# Patient Record
Sex: Female | Born: 1965 | Race: Black or African American | Hispanic: No
Health system: Midwestern US, Community
[De-identification: ages and names within clinical notes are randomized; demographics above are authoritative.]

## PROBLEM LIST (undated history)

## (undated) DIAGNOSIS — R87629 Unspecified abnormal cytological findings in specimens from vagina: Secondary | ICD-10-CM

## (undated) DIAGNOSIS — I10 Essential (primary) hypertension: Secondary | ICD-10-CM

## (undated) DIAGNOSIS — E785 Hyperlipidemia, unspecified: Secondary | ICD-10-CM

## (undated) DIAGNOSIS — Z5189 Encounter for other specified aftercare: Secondary | ICD-10-CM

## (undated) DIAGNOSIS — J449 Chronic obstructive pulmonary disease, unspecified: Secondary | ICD-10-CM

## (undated) DIAGNOSIS — J45909 Unspecified asthma, uncomplicated: Secondary | ICD-10-CM

## (undated) DIAGNOSIS — T7840XA Allergy, unspecified, initial encounter: Secondary | ICD-10-CM

## (undated) DIAGNOSIS — D869 Sarcoidosis, unspecified: Secondary | ICD-10-CM

## (undated) HISTORY — DX: Encounter for other specified aftercare: Z51.89

## (undated) HISTORY — DX: Essential (primary) hypertension: I10

## (undated) HISTORY — DX: Hyperlipidemia, unspecified: E78.5

## (undated) HISTORY — DX: Unspecified asthma, uncomplicated: J45.909

## (undated) HISTORY — DX: Chronic obstructive pulmonary disease, unspecified: J44.9

## (undated) HISTORY — PX: ABDOMINAL HYSTERECTOMY: SHX81

## (undated) HISTORY — DX: Sarcoidosis, unspecified: D86.9

## (undated) HISTORY — DX: Allergy, unspecified, initial encounter: T78.40XA

---

## 1999-09-05 NOTE — H&P (Signed)
[facility]                              HISTORY AND PHYSICAL   NAME:          Megan Carter, Megan Carter   MR #:          [id number]                     ADM DATE:         09/05/1999   BILLING #:     [id number]                    PT. LOCATION      OR  OR31   SS #           [id number]   Wynetta Fines, M.D.   cc:   Wynetta Fines, M.D.   HISTORY OF PRESENT ILLNESS:  The patient is a 34 year old gravida 3 para 3   female.  Her last menstrual period was August 23, 1999.   She is currently   not on contraception. The patient initially presented to me earlier this   year complaining of a history of uterine fibroids. At that time the patient   did not have significant complaints and she was counseled and advised that   we would manage her expectantly.   The patient came back later this year in May complaining of dyspareunia and   lower back pain. She was given a prescription for Anaprox and instructed to   follow-up with her primary care physician regarding other possible   etiologies of her lower back pain.  This evaluation by her primary care   physician was unremarkable.   He continued the patient on nonsteroidals;   namely, Celebrex. However, the patient stated that she continued to have   lower back pain and dyspareunia.   A pelvic ultrasound examination in my office revealed the patient to have   at least 4 uterine fibroids, the largest of which measuring 29 mm. in   diameter.  Her ovaries were unremarkable except for some simple cysts on   her left ovary.  The patient was counseled regarding these findings and   stated that secondary to her continued back pain and dyspareunia, she   desired definitive therapy for her uterine fibroids.   She is, therefore,   scheduled today for a vaginal hysterectomy.  However, she understands if   there is any difficulty with a vaginal hysterectomy that an abdominal   approach will be undertaken.   She also understands the risks of the    procedure includes infection, bleeding, damage to bowel and/or urinary   tract.   PAST MEDICAL HISTORY:  The patient had some sort of surgery on her bladder   at age 2. She is uncertain of the nature of this surgery.   She has had   three spontaneous vaginal deliveries. She received a blood transfusion   after one of her deliveries.   MEDICATIONS:   1.  Tylenol PM.   ALLERGIES: NO KNOWN DRUG ALLERGIES.   FAMILY HISTORY:  Remarkable for hypertension  in her mother.   SOCIAL HISTORY:  The patient smokes about ten cigarettes per day.  She   drinks occasional alcoholic beverages and denies illicit drug use.   REVIEW OF SYSTEMS:  Remarkable for lower back pain and dyspareunia.   PHYSICAL EXAMINATION:  GENERAL APPEARANCE:  Well-developed, well-nourished female who appears her   stated age, in no acute distress.   Temperature 97.4, blood pressure 110/60, pulse 84, respirations 16.   HEENT: Normocephalic.   NECK: Supple without masses.   CARDIOVASCULAR:  Regular rate and rhythm.   RESPIRATORY:  Lungs clear to auscultation.   ABDOMEN: Soft, non-tender.   PELVIC: Vulva and vagina no lesions or discharge.  Cervical os closed   without lesions.  Uterus approximately 8 weeks' size and non-tender with no   descensus.   Adnexa non-tender without masses.   EXTREMITIES:  Unremarkable.   IMPRESSION:   1.  Uterine fibroids, dyspareunia, lower back pain.   PLAN:  Vaginal hysterectomy, possibly abdominal hysterectomy.

## 1999-09-06 NOTE — Op Note (Signed)
[facility]                                OPERATION REPORT   NAME:         Megan Carter, Megan Carter   MR #:         [id number]                    DATE:           09/05/1999   BILLING #:    [id number]                   PT. LOCATION:   [id number]   SS #          [id number]   Wynetta Fines, M.D.   cc:   Wynetta Fines, M.D.   PREOPERATIVE DIAGNOSES:   Uterine fibroids, dyspareunia, lower back pain.   POSTOPERATIVE DIAGNOSES:   Same, endometriosis.   PROCEDURE:   Vaginal hysterectomy.   SURGEON:   Dr. Renita Papa.   ANESTHESIA:   General endotracheal.   ESTIMATED BLOOD LOSS:   100cc.   FINDINGS:   Approximately 8-weeks-sized, irregularly-shaped uterus with normal tubes   and ovaries.  Endometrial implant on rectosigmoid colon.   SPECIMENS:   Uterus.   DRAINS:   Foley catheter.   PACKING:   Iodoform vaginal packing.   COMPLICATIONS:   None.   DESCRIPTION OF PROCEDURE:  The patient was taken to operating room and   placed in a supine position.  After satisfactory induction of  general   endotracheal anesthesia, she was placed in the dorsal lithotomy position,   prepped and draped in the usual sterile fashion.  A weighted speculum was   placed in the posterior vagina, the right-angle retractor in the anterior   vagina and a Deaver retractor laterally.  The anterior cervix was grasped   with a straight double-tooth tenaculum and the posterior cervix was grasped   with a curved double-tooth tenaculum.  The junction between the vagina and   the cervix was injected with normal saline to help create a plane.  The   Bovie was then used to separate from the vagina from the cervix.  The Mayo   scissors were then used to enter the peritoneal cavity posteriorly.  A   curved Heaney clamp was placed on the uterosacral ligament on the right.   This was transected and suture ligated.  The same procedure was repeated on   the left.  The peritoneum was held in the midline with a figure-of-eight   stitch of  Vicryl suture to the vaginal cuff.  The bladder pillars were then   clamped, transected and suture ligated anteriorly bilaterally.  An   additional bite was taken of each cardinal ligament bilaterally.  These   were transected and suture ligated.  The peritoneum was then entered   anteriorly.  Care was taken prior to this time to make sure the bladder had   been pushed superiorly.  After the peritoneum was entered anteriorly, the   remaining cardinal ligaments and part of the uterine vessels were taken   bilaterally with a curved Heaney clamped, transected and suture ligated.   The remaining uterine vessels were taken bilaterally with the curved Heaney   clamp, transected and suture ligated.  Two clamps were placed across the   utero-ovarian ligament and the fallopian tube on the right.  These  were   transected.  A free tie was placed behind the first clamp and this tie was   cut.  A suture ligature was placed behind the second clamp.  The same   procedure was repeated on the contralateral side.  The specimen was then   removed.  The tubes and ovaries appeared normal bilaterally; however,   fortuitously a brown, approximately 4 centimeter area of endometriosis was   noted on the rectosigmoid colon with the classic powder-burn appearance.   The peritoneum anteriorly was then grasped with pickups and a pursestring   suture of 2-0 chromic was started anteriorly.  This was taken in a   clockwise fashion with bites being taken from the utero-ovarian pedicle,   the pedicle to the lower part of the cardinal ligament, the uterosacral   ligament.  A superficial bite was taken high on the rectosigmoid colon to   help prevent an enterocele.  The suture was continued clockwise   incorporating the same structures on the right.  Once this was done, the   pursestring was closed.  A couple of additional bites were taken in order   to provide adequate closure of the peritoneum.  Once this was achieved, the   suture was tied and cut.   The posterior vaginal cuff was run with a lock   stitch of Vicryl suture for hemostasis prior to placement of pursestring   suture.  The anterior vaginal cuff was closed following the pursestring   suture.  It was closed using interrupted figure-of-eight stitches of Vicryl   suture.  The sutures which had been placed on the uterosacral ligaments and   held were then used to provide additional support to the vaginal apex.   This was done by using a free needle to take one end of the suture through   the vagina mucosa.  These were then tied on each side.  Good hemostasis was   visualized.  A Foley was placed in the bladder with the return of a   moderate amount of clear yellow urine.  The vagina was then packed with   iodoform gauze.  The patient was returned to a supine position and   anesthesia was discontinued.  She was transferred to the recovery room in   stable condition.

## 1999-09-06 NOTE — Op Note (Signed)
[facility]                                OPERATION REPORT   NAME:         Megan Carter, Megan Carter   MR #:         [id number]                    DATE:           09/05/1999   BILLING #:    [id number]                   PT. LOCATION:   [id number]   SS #          [id number]   Wynetta Fines, M.D.   cc:   Wynetta Fines, M.D.   PREOPERATIVE DIAGNOSES:   Uterine fibroids, dyspareunia, lower back pain.   POSTOPERATIVE DIAGNOSES:   Same, endometriosis.   PROCEDURE:   Vaginal hysterectomy.   SURGEON:   Dr. Renita Papa.   ANESTHESIA:   General endotracheal.   ESTIMATED BLOOD LOSS:   100cc.   FINDINGS:   Approximately 8-weeks-sized, irregularly-shaped uterus with normal tubes   and ovaries.  Endometrial implant on rectosigmoid colon.   SPECIMENS:   Uterus.   DRAINS:   Foley catheter.   PACKING:   Iodoform vaginal packing.   COMPLICATIONS:   None.   DESCRIPTION OF PROCEDURE:  The patient was taken to operating room and   placed in a supine position.  After satisfactory induction of  general   endotracheal anesthesia, she was placed in the dorsal lithotomy position,   prepped and draped in the usual sterile fashion.  A weighted speculum was   placed in the posterior vagina, the right-angle retractor in the anterior   vagina and a Deaver retractor laterally.  The anterior cervix was grasped   with a straight double-tooth tenaculum and the posterior cervix was grasped   with a curved double-tooth tenaculum.  The junction between the vagina and   the cervix was injected with normal saline to help create a plane.  The   Bovie was then used to separate from the vagina from the cervix.  The Mayo   scissors were then used to enter the peritoneal cavity posteriorly.  A   curved Heaney clamp was placed on the uterosacral ligament on the right.   This was transected and suture ligated.  The same procedure was repeated on   the left.  The peritoneum was held in the midline with a figure-of-eight    stitch of Vicryl suture to the vaginal cuff.  The bladder pillars were then   clamped, transected and suture ligated anteriorly bilaterally.  An   additional bite was taken of each cardinal ligament bilaterally.  These   were transected and suture ligated.  The peritoneum was then entered   anteriorly.  Care was taken prior to this time to make sure the bladder had   been pushed superiorly.  After the peritoneum was entered anteriorly, the   remaining cardinal ligaments and part of the uterine vessels were taken   bilaterally with a curved Heaney clamped, transected and suture ligated.   The remaining uterine vessels were taken bilaterally with the curved Heaney   clamp, transected and suture ligated.  Two clamps were placed across the   utero-ovarian ligament and the fallopian tube on the right.  These  were   transected.  A free tie was placed behind the first clamp and this tie was   cut.  A suture ligature was placed behind the second clamp.  The same   procedure was repeated on the contralateral side.  The specimen was then   removed.  The tubes and ovaries appeared normal bilaterally; however,   fortuitously a brown, approximately 4 centimeter area of endometriosis was   noted on the rectosigmoid colon with the classic powder-burn appearance.   The peritoneum anteriorly was then grasped with pickups and a pursestring   suture of 2-0 chromic was started anteriorly.  This was taken in a   clockwise fashion with bites being taken from the utero-ovarian pedicle,   the pedicle to the lower part of the cardinal ligament, the uterosacral   ligament.  A superficial bite was taken high on the rectosigmoid colon to   help prevent an enterocele.  The suture was continued clockwise   incorporating the same structures on the right.  Once this was done, the   pursestring was closed.  A couple of additional bites were taken in order   to provide adequate closure of the peritoneum.  Once this was achieved, the    suture was tied and cut.  The posterior vaginal cuff was run with a lock   stitch of Vicryl suture for hemostasis prior to placement of pursestring   suture.  The anterior vaginal cuff was closed following the pursestring   suture.  It was closed using interrupted figure-of-eight stitches of Vicryl   suture.  The sutures which had been placed on the uterosacral ligaments and   held were then used to provide additional support to the vaginal apex.   This was done by using a free needle to take one end of the suture through   the vagina mucosa.  These were then tied on each side.  Good hemostasis was   visualized.  A Foley was placed in the bladder with the return of a   moderate amount of clear yellow urine.  The vagina was then packed with   iodoform gauze.  The patient was returned to a supine position and   anesthesia was discontinued.  She was transferred to the recovery room in   stable condition.

## 2001-05-11 NOTE — ED Provider Notes (Signed)
[facility]                      EMERGENCY DEPARTMENT TREATMENT REPORT   NAME:  Megan Carter, Megan Carter   MR #:         BILLING #: [id number]          DOS: 05/11/2001   TIME: 5:32 P   [id number]   cc:   Primary Physician:   The patient was seen at 1345.   CHIEF COMPLAINT:   Laceration to right 5th digit.   HISTORY OF PRESENT ILLNESS:  This is a 36 year old female who arrived to   the emergency department complaining that she was washing some dishes and   lacerated her right 5th digit.  She has bleeding under control and denies   any other injuries.   REVIEW OF SYSTEMS:   ENDOCRINE:  No diabetic symptoms.   PAST MEDICAL HISTORY:   Negative.   SOCIAL HISTORY:  Negative.   FAMILY HISTORY:  Negative.   ALLERGIES:  No known drug allergies.   MEDICATIONS:   None.   PHYSICAL EXAMINATION:   VITAL SIGNS:  Blood pressure 138/81, pulse 90, respirations 18, temperature   99.  On a pain scale of 0-10, the patient is a 4/10.   GENERAL APPEARANCE:  The patient appears well developed and well nourished.   Appearance and behavior are age and situation appropriate.   NECK:  Supple, nontender, symmetrical, no masses or JVD, trachea midline,   thyroid not enlarged, nodular, or tender.   RESPIRATORY:  Clear and equal breath sounds.  No respiratory distress,   tachypnea, or accessory muscle use.   CARDIOVASCULAR:  Heart regular, without murmurs, gallops, rubs, or thrills.   PMI not displaced.   MUSCULOSKELETAL:  Stance and gait appear normal.   SKIN:  Warm and dry without rashes.   NEUROLOGIC:  Cranial nerves, deep tendon reflexes, strength, and light   touch sensation are unremarkable.   RIGHT FIFTH DIGIT:  The patient has a 3-cm flap avulsion laceration.   Distal sensation and capillary refill is less than 3 seconds.   The patient   had good movement of the 5th digit.   COURSE IN THE EMERGENCY DEPARTMENT:  The patient had LAT and TD booster.    PROCEDURE:  The patient had four 4-0 Ethilon sutures applied.  Anesthesia   was obtained.  There was good approximation.  Copious amounts of normal   saline were utilized to clean the wound.  There was no foreign body noted.   Xeroform gauze was placed and tube gauze.   DISPOSITION:  The patient is discharged with verbal and written   instructions and a referral for ongoing care.  The patient is aware that   they may return at any time for new or worsening symptoms.   Condition is   stable, disposition is to home.   FINAL DIAGNOSIS:    A 3-centimeter laceration to right fifth digit.   DISCHARGE INSTRUCTIONS:  Keep the area clean and dry, return if there are   any signs of infection.  Return back in 7-10 days for suture removal.  The   patient was given off work until   March 31.   Electronically Signed By:   Wetzel Bjornstad Arvella Merles, M.D. 05/12/2001 18:23   ____________________________   Wetzel Bjornstad. Arvella Merles, M.D.   zga  D:  05/11/2001  T:  05/12/2001 11:59 A   [id number]   Hilaria Ota, PA-C

## 2008-11-18 NOTE — ED Provider Notes (Signed)
[facility]                      EMERGENCY DEPARTMENT TREATMENT REPORT   PRELIMINARY (DRAFT) -- FINAL REPORT  in HPF   NAME:  Megan Carter, Megan Carter              SEX:            F   DATE:  11/18/2008                     DOB:            [birth year]   MR#    [id number]                       TIME SEEN       10:23 A   ACCT#  [id number]                      ROOM:           ER  ZO10   cc:   CHIEF COMPLAINT   Cough, wheezing.   HISTORY OF PRESENT ILLNESS   This is a 43 year old female started with a sore throat, kind of a   "burning" in her throat about a week ago.  That has gotten better, but she   has started to cough, feels that she has been wheezing.  States that she   feels like there is phlegm in her chest, but she is unable to expirate it.   She feels nauseated when she coughs a lot.  She has had no diarrhea.   Denied any leg pain or swelling, no fevers.  She has not traveled anywhere.   REVIEW OF SYSTEMS   CONSTITUTIONAL:  No fever, chills, weight loss.   ENT: Had burning in her throat that has gotten better.   RESPIRATORY:  Positive for cough, wheezing, shortness of breath.   CARDIOVASCULAR:  No chest pain, chest pressure, or palpitations.   GASTROINTESTINAL:  No vomiting, diarrhea, or abdominal pain.   MUSCULOSKELETAL:  Denies any leg pain or swelling.   INTEGUMENTARY:  No rashes.   NEUROLOGICAL:  No headaches, sensory or motor symptoms.   PAST MEDICAL HISTORY   Asthma as a child.  She has had total abdominal hysterectomy.   MEDICATIONS   Nothing on a regular basis.   ALLERGIES   None.   SOCIAL HISTORY   Positive for tobacco, smoking cessation discussed.  Negative for travel.   FAMILY HISTORY   Noncontributory.   PHYSICAL EXAMINATION   GENERAL APPEARANCE:  This is a well-developed female.   VITAL SIGNS:  Blood pressure 150/69, pulse 81, respirations 16, temperature   97.9, O2 saturation 100% on room air.   HEENT:  Eyes:  Conjunctivae are clear.  Ears:  No __ bilaterally.  Mouth:    Mucous membranes pink.  Throat is clear.   NECK:  Supple, nontender, symmetrical, no masses or JVD, trachea midline.   Thyroid not enlarged, nodular, or tender.   LUNGS:  Scattered wheezing heard throughout with some rhonchi.  She is not   tachypneic or dyspneic with conversation.  No accessory muscle use.   HEART:  Regular rate and rhythm.   ABDOMEN:  Soft and nontender.   EXTREMITIES:  Warm and dry.  Calves soft and nontender.   SKIN:  Without rash.   NEUROLOGICAL:  She is awake, alert, and oriented.   CONTINUATION  BY JULIA HUBBARD, PA-C   IMPRESSION/MANAGEMENT PLAN   This 43 year old female presents for evaluation of wheezing, cough. At this   time a chest x-ray will be obtained to make sure there is no acute   pulmonary process.  We will give her neb treatment, prednisone p.o. and   some p.o. fluids, and we will reassess.   DIAGNOSTIC STUDIES   Chest x-ray was read by radiology as normal study per Dr. Noel Gerold.   COURSE IN THE EMERGENCY DEPARTMENT   Findings were discussed.  She was breathing better after the nebulizer   treatment and albuterol inhaler was ordered.  At this time discussed we   would continue her on prednisone at home, rest, push fluids, prescription   for some Robitussin AC to help with her cough.  We we will also start her   on some doxycycline.  She understands to certainly return any time   worsening or new concerns.  Discussed smoking cessation.   FINAL DIAGNOSES   Evaluation acute bronchitis, bronchospasm.   DISPOSITION AND PLAN   The patient was discharged home in stable condition to follow up as above.   The patient was examined and evaluated by myself and Dr. Angelena Sole who   agrees with the above assessment and plan.   ____________________________   Imogene Burn, M.D.   Dictated By:  Fara Chute, PA-C   My signature above authenticates this document and my orders, the final   diagnosis(es), discharge prescription(s) and instructions in the [facility] record.    ec  D:  11/18/2008  T:  11/18/2008  4:49 P   [id number]

## 2012-01-14 LAB — CBC WITH AUTOMATED DIFF
BASOPHILS: 2 % (ref 0–3)
EOSINOPHILS: 5 % (ref 0–5)
HCT: 43.8 % (ref 37.0–50.0)
HGB: 13.9 gm/dl (ref 13.0–17.2)
LYMPHOCYTES: 37 % (ref 28–48)
MCH: 23.6 pg — ABNORMAL LOW (ref 25.4–34.6)
MCHC: 31.6 gm/dl (ref 30.0–36.0)
MCV: 74.5 fL — ABNORMAL LOW (ref 80.0–98.0)
MONOCYTES: 10 % (ref 1–13)
MPV: 8.1 fL (ref 6.0–10.0)
NEUTROPHILS: 47 % (ref 34–64)
NRBC: 0 (ref 0–0)
PLATELET COMMENTS: NORMAL
PLATELET: 309 10*3/uL (ref 140–450)
RBC: 5.87 M/uL — ABNORMAL HIGH (ref 3.60–5.20)
RDW: 15.4 % — ABNORMAL HIGH (ref 11.5–14.0)
WBC: 4.4 10*3/uL (ref 4.0–11.0)

## 2012-01-14 LAB — CKMB PROFILE
CK - MB: 1.3 ng/ml (ref 0.0–3.6)
CK - MB: 1.1 ng/ml (ref 0.0–3.6)
CK - MB: 1.5 ng/ml (ref 0.0–3.6)
CK-MB Index: 0.6 % (ref 0.0–4.9)
CK-MB Index: 0.6 % (ref 0.0–4.9)
CK-MB Index: 0.9 % (ref 0.0–4.9)
CK: 220 U/L — ABNORMAL HIGH (ref 26–192)
CK: 187 U/L (ref 26–192)
CK: 165 U/L (ref 26–192)

## 2012-01-14 LAB — METABOLIC PANEL, BASIC
BUN: 19 mg/dl (ref 7–25)
CO2: 29 mEq/L (ref 21–32)
Calcium: 9.4 mg/dl (ref 8.5–10.1)
Chloride: 103 mEq/L (ref 98–107)
Creatinine: 0.9 mg/dl (ref 0.6–1.3)
GFR est AA: >60
GFR est non-AA: >60
Glucose: 80 mg/dl (ref 74–106)
Sodium: 138 mEq/L (ref 136–145)

## 2012-01-14 LAB — TROPONIN I
Troponin-I: <0.015 ng/ml (ref 0.00–0.09)
Troponin-I: <0.015 ng/ml (ref 0.00–0.09)
Troponin-I: <0.015 ng/ml (ref 0.00–0.09)

## 2012-01-14 NOTE — ED Provider Notes (Signed)
[facility]  EMERGENCY DEPARTMENT TREATMENT REPORT  NAME:  Megan Carter  SEX:   F  ADMIT: 01/14/2012  DOB:   1966/02/04  MR#    [id number]  ROOM:  EO03  TIME SEEN: 11 29 AM  ACCT#  [id number]        CHIEF COMPLAINT:  Chest pain.    HISTORY OF PRESENT ILLNESS:  The patient is a 46 year old female here today for evaluation of intermittent   chest pain.  She describes it as a squeezing sensation intermittently over the   last 2 to 3 weeks.  Not so much exertional; can happen at rest.  Has no   associated nausea or vomiting with this.  Does have some very intermittent   mild shortness of breath when the chest pain comes on.  Denies any hemoptysis,   has no lower extremity edema or swelling.  Has no history of DVT or pulmonary   emboli.  Says she does endorse some dizziness which she describes actually as   a lightheadedness; feels like she may pass out, but has actually not passed   out.  She said these  usually come on after chest pain-like episodes but not   always.    She denies any headache.  Has no neurologic symptoms, otherwise has no   complaints.    REVIEW OF SYSTEMS:  CONSTITUTIONAL:  No fever, chills, or weight loss.   EYES:   No visual symptoms.   ENT:  No sore throat, runny nose, or other URI symptoms.   HEMATOLOGIC/LYMPHATIC:   No excessive bruising or lymph node swelling.   RESPIRATORY:  Positive for intermittent shortness of breath.  Negative for   cough or hemoptysis.  CARDIOVASCULAR:  Positive for chest pain.  Negative for pressure or   palpitations.  GASTROINTESTINAL:  No vomiting, diarrhea, or abdominal pain.   GENITOURINARY:  No dysuria, frequency, or urgency.   NEUROLOGICAL:  No headaches, sensory or motor symptoms.   Denies complaints in all other systems.     PAST MEDICAL HISTORY:  Asthma.    PAST SURGICAL HISTORY:  Hysterectomy.    PSYCHIATRIC HISTORY:  Negative.    SOCIAL HISTORY:  The patient smokes cigarettes, 5 to 6 cigarettes per day.  Denies illicit drug    abuse or alcohol abuse.    FAMILY HISTORY:  Includes coronary artery disease, hypertension, and diabetes.    ALLERGIES:  NO KNOWN DRUG ALLERGIES.    CURRENT MEDICATIONS:  The patient takes no medications.    PHYSICAL EXAMINATION:  GENERAL:  The patient is a well-appearing 46 year old female.  VITAL SIGNS:  On arrival, blood pressure 139/79, pulse 80, respirations are   18, temperature is 98.2, oxygen saturation is 99% on room air.  HEENT:  Head is normocephalic.  NECK:  Supple, full range of motion.  There is no JVD appreciated.  HEART:  Regular rate and rhythm, no murmurs, rubs or gallops.  LUNGS:  Clear to auscultation bilaterally.  ABDOMEN:  Soft and nondistended.  SKIN:  Negative for diaphoresis or rash.    INITIAL ASSESSMENT AND MANAGEMENT PLAN:  This is a healthy appearing 46 year old female here today with intermittent   chest discomfort, but known family history of coronary disease for evaluation   at this time.  She appears very well.  Plan at this point in time is to do a   chest x-ray and EKG.  We will send cardiac markers.  She has stable vital   signs, has no risk factors  for DVT or PE and by Wells criteria is low risk,   and by PE Rule Out criteria is negative, so I do not think any workup for   pulmonary embolus is necessary, although given the family history, we should   entertain possible coronary artery disease.      DIAGNOSTIC STUDIES:   Chest x-ray shows no acute abnormality.  The EKG shows a normal sinus rhythm   with a rate of 77, normal axis, but no ST elevations.  Impression is no acute   ischemia.      LABORATORY DATA:   Results: CBC: White count is 4.4, hemoglobin 13.9, platelets are 309.    Metabolic panel is unremarkable.  Cardiac marker is within normal limits.      COURSE IN EMERGENCY DEPARTMENT:   While the patient was here, she remained stable.  Given her results; they are   very reassuring, we have not ruled out acute coronary syndrome or angina-like    equivalents.  After discussion with the patient, the plan is to watch her   overnight.  We will repeat cardiac enzymes and do the chest pain protocol for   a stress test in the morning.      DISPOSITION:  Chest pain.    ADMITTING DIAGNOSES:    Chest pain.      ___________________  Wynelle Bourgeois MD  Dictated By: Marland Kitchen     My signature above authenticates this document and my orders, the final   diagnosis (es), discharge prescription (s), and instructions in the [facility]   Pul[facility]heck record.  DE  D:01/14/2012  T: 01/14/2012 12:23:49  [id number]  Authenticated by Wynelle Bourgeois, MD On 01/15/2012 10:44:31 AM

## 2012-01-15 NOTE — Discharge Summary (Signed)
[facility]  ED Discharge Summary  NAME:  Megan Carter, Megan Carter  SEX:   F  DOB: [birth year]  MR#    [id number]  ROOM:  EO03  ACCT#  [id number]        DATE OF DISCHARGE:   01/15/2012 at 11:15    DATE AND TIME OF ASSIGNMENT:  01/14/2012 at 11:05.    ASSIGNMENT DIAGNOSIS:  Acute precordial pain.    HISTORY:  This is a 46 year old female who presented to the Emergency Department for   evaluation of chest pain, was evaluated for possibility of acute ischemic   coronary disease.  The patient was seen in the Emergency Department for   evaluation of chest pain.  The evaluation was unremarkable and subsequently   the patient was assigned to observation under chest pain protocol.      COURSE IN THE EMERGENCY DEPARTMENT:  Course in Observation:  The patient remained pain free and did not develop   other symptoms.  The cardiac enzymes were negative and the patient underwent   an EST, the results of which were ____.  Stress echo was read by cardiology as   an adequate study with target heart rate achieved, it was negative for   ischemia.  Findings were discussed, discussed other possible etiologies.  It   was discussed with the patient that their cardiac evaluation was unremarkable   and does not indicate that immediate intervention is necessary. The patient   was counseled that the testing is not 100 percent accurate and may generate   false negatives.  She remained asymptomatic with the stress test.  Did not   have any chest pain, has been comfortable here.  She is currently eating her   breakfast.  At this time discussed followup.  We will give her the name of Dr.   Bethanne Ginger on call.  Advised to call this afternoon to schedule a followup   appointment.  To certainly seek medical attention any time worsening or new   concerns.  She was given the opportunity to ask questions and she does feel   comfortable with this plan.    PHYSICAL EXAMINATION:  GENERAL:  A well-developed female.   VITAL SIGNS:  Blood pressure 115/60, pulse 73, respirations 18, temperature   97.8, O2 sats 100% on room air.  HEENT:  Conjunctivae are clear.  Mucous membranes pink.  NECK:  Supple, nontender, symmetrical, no masses or JVD, trachea midline,   thyroid not enlarged, nodular, or tender.  LUNGS:  Clear to auscultation with symmetrical expansion.  HEART:  Has a regular rate and rhythm.  ABDOMEN:  Soft, nontender.  EXTREMITIES:  Warm and dry.    FINAL DIAGNOSIS:  Acute precordial pain, cardiac unlikely.    DISPOSITION AND PLAN:  The patient was discharged home in stable condition to follow up as above.    The patient was examined and evaluated by myself and Dr. Candis Shine who   agrees with the above assessment and plan.      ___________________  Wynelle Bourgeois MD  Dictated UE:AVWUJ C. Hubbard, [place]    My signature above authenticates this document and my orders, the final   diagnosis(es), discharge prescription(s) and instructions in the [facility]   [facility]ulse[facility] record.  [facility]  D:01/15/2012  T: 01/15/2012 18:01:04  [id number]  Authenticated by Wynelle Bourgeois, MD On 01/17/2012 07:48:00 AM

## 2012-03-26 NOTE — ED Provider Notes (Signed)
[facility]  EMERGENCY DEPARTMENT TREATMENT REPORT  NAME:  Megan Carter  SEX:   F  ADMIT: 03/26/2012  DOB:   [birth year]  MR#    [id number]  ROOM:    TIME SEEN: 03 21 PM  ACCT#  [id number]        TIME OF EVALUATION:  0955    CHIEF COMPLAINT:  Toothache and facial swelling.    HISTORY OF PRESENT ILLNESS:  A 47 year old female with multiple bad teeth presents complaining of pain and   swelling to the gingiva adjacent to first premolar.  The first premolar has   been missing for some time as have several other teeth along the right   jawline.  She has never followed up with a dentist as she does not have any   insurance.  Her symptoms just began last night.  Pain rated 8 out of 10,   constant.  No fever.    REVIEW OF SYSTEMS:  CONSTITUTIONAL:  As above.  ENT:  As above.  SKIN:  Facial swelling.    PAST MEDICAL HISTORY:  Hysterectomy.    SOCIAL HISTORY:  Tobacco use.  Denies alcohol and recreational drug use.    FAMILY HISTORY:  Noncontributory.    ALLERGIES:  NONE.    MEDICATIONS:  None.    PHYSICAL EXAMINATION:  VITAL SIGNS:  BP 121/71, pulse 78, respirations 16, temperature 98, pain 8, O2   saturation 100% on room air.  GENERAL APPEARANCE:  The patient appears well developed, well nourished.  HEENT:  Eyes:  Conjunctivae clear, lids normal.  Pupils equal, symmetrical,   and normally reactive.  Ears:  Canals and TMs clear bilaterally.  Mouth and   throat:  Oral mucosa is pink and moist.  No trismus or sublingual elevation.    Floor of mouth is soft.  Teeth:  Poor dentition throughout.  Multiple teeth   are missing.  Peri-teeth are present, appear to have cavities or fracture   along the right lower gumline.  The central incisor through cuspid are fully   intact.  The second premolar is partially fractured.  Rest of teeth are   missing.  She is tender there with gingival swelling adjacent to where the   first premolar would lie.  Area is very tender and fluctuant.  With pressure,    pustular drainage is expressed.  No other areas of fluctuance or abscess are   noted.  Posterior pharynx uvula midline.  No erythema to posterior pharynx.    No tonsillar swelling, no exudate.  Airway is  patent, no stridor.  NECK:  Soft, nontender, no masses, no submandibular swelling.   SKIN:  Minimal facial swelling noted along the right mandible adjacent to   where first premolar would lie.    CONTINUATION BY COLLEEN O'LEARY, PA:     ASSESSMENT AND MANAGEMENT PLAN:  The patient with pain to dentition and face.  Does have a dental abscess   present that does appear to already be draining some purulent discharge.  I   will perform an I&D to this site.  Discharge patient with antibiotics, pain   medicine and dental follow up. She is agreeable with this plan.    PROCEDURE:  Performed by myself, using a continuous suction throughout, pressure was   applied to her dental abscess which already is spontaneously draining.  A   small amount of pus is extracted.  As I do believe additional pus is present,   I did perform an I&D  with an 11 blade.  Additional pus was evacuated.    Afterwards direct pressure with gauze was applied to the incision site for 5   minutes.  When rechecked, bleeding had stopped.  The site was clean.  The   patient tolerated the procedure well.    FINAL DIAGNOSES:  1.  Dental abscess.  2.  Dental pain.    DISPOSITION AND PLAN:  The patient discharged home in stable condition with verbal instructions for   ongoing care.  She is to follow up with dentist for further treatment.    Pen-Vee K prescription given.  Should take ibuprofen 600 mg 3 times daily for   mild pain.  Vicodin prescription given as needed for severe pain.  She was   warned not to drive or operate machinery while medicated on Vicodin.  She is   aware she may return at any time for new or worsening symptoms.  The patient   was personally evaluated by myself and Dr. Hervey Ard, who agrees with the   above assessment and plan.       ___________________  Tana Conch MD  Dictated By: Verlin Grills, PA    My signature above authenticates this document and my orders, the final   diagnosis (es), discharge prescription (s), and instructions in the [facility]   Pul[facility]heck record.  zga  D:03/26/2012  T: 03/26/2012 15:45:11  [id number]  Authenticated by Tana Conch, MD On 04/09/2012 57:84:69 PM

## 2013-11-03 NOTE — ED Provider Notes (Addendum)
[facility]  EMERGENCY DEPARTMENT TREATMENT REPORT  NAME:  Megan Carter  SEX:   F  ADMIT: 11/01/2013  DOB:   [birth year]  MR#    [id number]  ROOM:    TIME DICTATED: 10 29 PM  ACCT#  [id number]        CHIEF COMPLAINT:  Cough.    HISTORY OF PRESENT ILLNESS:  The patient is a 48 year old female smoker with a history of bronchitis.  She  states that she has used  inhalers in the past and she had a cold. She is  complaining of  nasal congestion. She is complaining of  2 to 3 days of  wheezing, chest tightness and cough.  She complains of laryngitis.  She denies  any fevers, chills.  The cough is nonproductive.  She denies any chest pain.    REVIEW OF SYSTEMS:  CONSTITUTIONAL:  No fever, chills, malaise.  HEENT:  See HPI.  RESPIRATORY:  See HPI.  CARDIOVASCULAR:  No chest pain, palpitations or edema.  GASTROINTESTINAL:  No vomiting, diarrhea, or abdominal pain.  MUSCULOSKELETAL:  No joint pain, swelling.  INTEGUMENTARY:  No rashes, abrasions, ecchymosis.  Denies complaints in all other systems.    PAST MEDICAL HISTORY:  Includes asthma.    PAST SURGICAL HISTORY:  None.    SOCIAL HISTORY:  She smokes tobacco, drinks socially.    ALLERGIES:  NO KNOWN DRUG ALLERGIES.    MEDICATIONS:  Not currently taking medications.    PHYSICAL EXAMINATION:  VITAL SIGNS:  Blood pressure is 140/90, pulse 66, respirations 18, temperature  98, pain 6 out of 10, O2 sats are 98% on room air.  GENERAL:  The patient is a healthy appearing 48 year old female who I saw  after her initial neb.  Eyes:  Conjunctivae clear, lids normal.  Pupils equal, symmetrical, and  normally reactive.  ENT: Mouth/Throat:  Surfaces of the pharynx, palate, and tongue are pink,  moist, and without lesions. Note that she has bilateral swollen, boggy  turbinates with clear rhinorrhea.  No sinus tenderness.  She has bilateral  bulging tympanic membranes without erythema or purulence.  HEART:  Regular rate and rhythm.   RESPIRATORY:  She has scattered wheezing in all lung fields on expiratory, and  they are fine wheezes.  CHEST:  Chest symmetrical without masses or tenderness.  SKIN: Clean, dry and intact.     EMERGENCY DEPARTMENT COURSE:  The patient was seen and examined.  She was given nebs, metered dose inhaler,  a dose of steroids.  She was given a course of steroids for home.  She was  given Afrin for nasal congestion.  She was given instructions for care and  followup.    DIAGNOSES:  1.  Bronchospasm.  2.  Upper respiratory infection.    DISPOSITION:  Discharged to home in stable condition.    The patient was personally evaluated by myself and Dr. Arvella Merles who agrees with  the above assessment and plan.      ___________________  Smitty Cords MD  Dictated By: Crecencio Mc, PA-C    My signature above authenticates this document and my orders, the final  diagnosis (es), discharge prescription (s), and instructions in the [facility]  Pul[facility]ck record.  Nursing notes have been reviewed by the physician/mid-level provider.    If you have any questions please contact [phone].    PB  D:11/02/2013 22:29:43  T: 11/03/2013 01:09:05  [id number]  Electronically Authenticated by:  Smitty Cords, M.D. On 11/03/2013 09:46 AM  EDT

## 2014-06-30 ENCOUNTER — Inpatient Hospital Stay
Admit: 2014-06-30 | Discharge: 2014-06-30 | Disposition: A | Discharge status: Home / Self Care | Payer: BLUE CROSS/BLUE SHIELD | Attending: Emergency Medicine

## 2014-06-30 DIAGNOSIS — K029 Dental caries, unspecified: Secondary | ICD-10-CM

## 2014-06-30 MED ORDER — PENICILLIN V-K 500 MG TAB
500 mg | ORAL_TABLET | Freq: Two times a day (BID) | ORAL | Status: AC
Start: 2014-06-30 — End: 2014-07-07

## 2014-06-30 MED ORDER — NAPROXEN 500 MG TAB
500 mg | ORAL_TABLET | ORAL | Status: DC
Start: 2014-06-30 — End: 2014-07-29

## 2014-06-30 NOTE — Progress Notes (Signed)
Life Coach had the pleasure of meeting the patient. Patient requires follow up for dental and Life Coach also became aware that the patient does not have a pcp established.  Life Coach provided the patient with an abundance of dental resources and offered to schedule the patient with a pcp.  Life Coach was in agreement.  Life Coach scheduled the patient as follows:  [facility]  [address], [address]  [place], [place] [id number]  [phone]  07/08/14 @ 9:30 am with a check in of 9:00 a.m.   Life Coach provided the patient with the information verbally and usps.  Life Coach will continue to follow up with the patient.

## 2014-06-30 NOTE — ED Provider Notes (Signed)
[facility]  EMERGENCY [facility] TREATMENT REPORT  NAME:  Megan ScottLAUZON, Megan  SEX:   F  ADMIT: 06/30/2014  DOB:   1965/06/08  MR#    [id number]  ROOM:  [id number]  TIME DICTATED: 04 42 PM  ACCT#  [id number]        TIME OF EVALUATION:   1011.     CHIEF COMPLAINT:  Tooth pain.    HISTORY OF PRESENT ILLNESS:  This is a 49 year old female who presents with a complaint of pain in her left   lower molar.  This has been going on for the last few days.  Woke up, was   kind of swollen, painful, it hurts to swallow.  She has not noticed any   drainage.  No fever, no chills, no other complaints.  She does have bad teeth.    REVIEW OF SYSTEMS:  CONSTITUTIONAL:  No fever, chills, or weight loss.   EYES:   No visual symptoms.  ENT:  Positive for pain in the left side of her face which she believes is   coming from a left lower molar tooth that is painful and feels swollen, with   pain when she swallows.  RESPIRATORY:  No cough, shortness of breath, or wheezing.  CARDIOVASCULAR:  No chest pain, chest pressure, or palpitations.  GASTROINTESTINAL:  No vomiting, diarrhea, or abdominal pain.  MUSCULOSKELETAL:  No leg pain or swelling.  NEUROLOGIC:  Positive for left-sided headache and facial pain.  Denies complaints in all other systems.    PAST MEDICAL HISTORY:   None.     MEDICATIONS:  None.    ALLERGIES:  NONE.    SOCIAL HISTORY:  Positive for tobacco dependence, smoking cessation encouraged.    PHYSICAL EXAMINATION:  GENERAL:  A well-developed female.  VITAL SIGNS:  Blood pressure 124/83, pulse 84, respirations 18, temperature   98.9.  HEENT:  Head and face:  No obvious facial swelling.  Eyes:  Conjunctivae   clear, lids normal.  Pupils equal, symmetrical, and normally reactive.  Ears:    TMs are clear bilaterally with some cerumen in the right canal.  Mouth:    Mucous membranes pink.  Teeth in poor repair with numerous fractured carious   teeth.  There are multiple teeth missing.  Her left lower molar is tender.  I    really cannot appreciate any fluctuance around this tooth.  No crepitus.    There are no masses or fullness in the floor of the mouth.  The throat is   clear.  NECK:  Supple, nontender.  LUNGS:  Clear to auscultation.  HEART:  Regular rate and rhythm.  ABDOMEN:  Soft, nontender.  EXTREMITIES:  Warm and dry, well perfused.    IMPRESSION AND MANAGEMENT PLAN:  This is a 49 year old female who presents for evaluation of dental pain   related to dental caries.  I query whether there may be a developing abscess   here.  We will cover with some Pen-Vee K.  I will write for some Naprosyn.  I   will have her use Tylenol.  I put a life coach consult in and they are seeing   her now to see if they can help her with followup with one of the clinics in   the area.  She understands to certainly seek medical attention worsening or   new concerns.  I have also written a note for work as well.    FINAL DIAGNOSIS:  Acute dental pain, dental caries, left  lower molar.    DISPOSITION AND PLAN:  The patient was discharged home in stable condition to follow up as above.    The patient was examined by myself and Dr. Hildred Priestim Brenley Priore who agrees with the   above assessment and plan.      ___________________  Gwenyth Allegraimothy S Evaline Waltman MD  Dictated By: Maurice SmallJulia C. Williams CheHubbard, [place]    My signature above authenticates this document and my orders, the final   diagnosis (es), discharge prescription (s), and instructions in the Epic   record.  If you have any questions please contact [phone].    Nursing notes have been reviewed by the physician/ advanced practice   clinician.    JMB  D:06/30/2014 16:42:48  T: 06/30/2014 17:40:44  [id number]

## 2014-06-30 NOTE — Other (Signed)
12:24 PM  06/30/2014     Discharge instructions given to patient (name) with verbalization of understanding. Patient accompanied by self.  Patient discharged with the following prescriptions naprosyn, penicillin. Patient discharged to home (destination).      Trecia RogersMICHELE LANTRY, RN

## 2014-07-08 ENCOUNTER — Ambulatory Visit
Admit: 2014-07-08 | Discharge: 2014-07-08 | Payer: PRIVATE HEALTH INSURANCE | Attending: Physician Assistant | Primary: Physician Assistant

## 2014-07-08 DIAGNOSIS — F17210 Nicotine dependence, cigarettes, uncomplicated: Secondary | ICD-10-CM

## 2014-07-08 DIAGNOSIS — J4520 Mild intermittent asthma, uncomplicated: Secondary | ICD-10-CM | POA: Insufficient documentation

## 2014-07-08 MED ORDER — ALBUTEROL SULFATE HFA 90 MCG/ACTUATION AEROSOL INHALER
90 mcg/actuation | RESPIRATORY_TRACT | Status: AC | PRN
Start: 2014-07-08 — End: ?

## 2014-07-08 MED ORDER — VARENICLINE 0.5 MG (11)-1 MG (3X14) TABS IN A DOSE PACK
0.5 mg (11)- 1 mg (42) | ORAL | Status: AC
Start: 2014-07-08 — End: ?

## 2014-07-08 NOTE — Progress Notes (Signed)
HISTORY OF PRESENT ILLNESS  Megan Carter is a 49 y.o. female.  HPI  Megan Carter is a 49 y.o. female who presents to the office today for asthma.  She is new to the practice. She has a hx of asthma. This was dx as a child. She has an albuterol inhaler but rarely has to use this. She blames her occasional SOB or wheezing due to tobacco use. She is a heavy smoker. She smokes up to 2 ppd since the age of 49yo. She has tried quitting in the past, usually cold [place], but has been unsuccessful. She is interested in quitting and is ready.  She has a past history of alcohol use, and stopped drinking alcohol 5 years ago. 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     She has not been to a physician in many years. She cannot recall her last physical exam, pap or mammogram.   She has 28lb unintentional weight loss, but this started after she started working for [employer] and had 12+ hour shifts. She did not have time to eat much. She has been drinking Boost to add more calories daily and this seems to be helping some.       Chief Complaint   Patient presents with   ??? Establish Care   ??? Asthma       Current Outpatient Prescriptions on File Prior to Visit   Medication Sig Dispense Refill   ??? [EXPIRED] penicillin v potassium (VEETID) 500 mg tablet Take 2 Tabs by mouth two (2) times a day for 7 days. 28 Tab 0   ??? naproxen (NAPROSYN) 500 mg tablet 1 tab po bid po prn pain 20 Tab 0     No current facility-administered medications on file prior to visit.     No Known Allergies  Past Medical History   Diagnosis Date   ??? Asthma      History   Smoking status   ??? Current Every Day Smoker -- 2.00 packs/day   ??? Start date: 04/09/1977   Smokeless tobacco   ??? Never Used     History   Alcohol Use No     Family History   Problem Relation Age of Onset   ??? Heart Failure Mother        Review of Systems   Constitutional: Positive for weight loss. Negative for fever, malaise/fatigue and diaphoresis.    Respiratory: Negative for cough, shortness of breath and wheezing.    Cardiovascular: Negative for chest pain, palpitations and leg swelling.   Gastrointestinal: Negative for nausea, vomiting, abdominal pain, diarrhea and constipation.   Musculoskeletal: Negative for myalgias and falls.   Skin: Negative for rash.   Neurological: Negative for dizziness and headaches.   Endo/Heme/Allergies: Negative for environmental allergies.   Psychiatric/Behavioral: Negative for depression. The patient is not nervous/anxious.        BP 124/71 mmHg   Pulse 64   Temp(Src) 98.2 ??F (36.8 ??C) (Oral)   Resp 20   Ht 5\' 5"  (1.651 m)   Wt 140 lb (63.504 kg)   BMI 23.30 kg/m2   SpO2 100%    Physical Exam   Constitutional: She is oriented to person, place, and time. She appears well-developed and well-nourished. No distress.   Neck: Normal range of motion. Neck supple. No thyromegaly present.   Cardiovascular: Normal rate, regular rhythm and normal heart sounds.    No murmur heard.  Pulmonary/Chest:  Effort normal and breath sounds normal. No respiratory distress. She has no wheezes.   Abdominal: Soft. She exhibits no distension. There is no tenderness.   Musculoskeletal: She exhibits no edema.   Lymphadenopathy:     She has no cervical adenopathy.   Neurological: She is alert and oriented to person, place, and time.   Skin: Skin is warm and dry.   Psychiatric: She has a normal mood and affect. Her behavior is normal. Thought content normal.   Nursing note and vitals reviewed.      ASSESSMENT and PLAN    ICD-10-CM ICD-9-CM    1. Cigarette nicotine dependence without complication F17.210 305.1 varenicline (CHANTIX STARTER PAK) 0.5 mg (11)- 1 mg (42) DsPk      PR SMOKING AND TOBACCO USE CESSATION 3 - 10 MINUTES   2. Mild intermittent asthma without complication J45.20 493.90 albuterol (PROVENTIL HFA, VENTOLIN HFA, PROAIR HFA) 90 mcg/actuation inhaler   3. H/O drug abuse Z87.898 305.93    4. H/O alcohol dependence (HCC) F10.21 303.93        She was counseled on smoking cessation for 5 minutes. She has been unsuccessful in the past with cold [place]. She will try Chantix. I printed off the savings card in the office for her to bring to the pharmacy. She is ready to quit smoking. She will follow up in 3 weeks  I refilled her albuterol inhaler. Asthma is stable and under control.  Congratulations on your success with continued abstinence from alcohol and drug use.   She will schedule an appointment for her well woman exam. At that time she will have a Pap, order labs and mammogram.   Reviewed medication and side effects. Patient agrees with the plan and verbalizes understanding.     Follow-up Disposition:  Return in about 3 weeks (around 07/29/2014) for well woman exam.    Lennox Grumbles, PA-C  07/08/2014

## 2014-07-08 NOTE — Progress Notes (Signed)
Megan Carter is a 49 y.o. female in today to establish care. Patient is interested in CPE.    Learning assessment completed; primary language is [place].

## 2014-07-08 NOTE — Patient Instructions (Signed)
Varenicline (By mouth)   Varenicline (var-EN-i-kleen)  Helps you quit smoking, as part of a support program.   Brand Name(s):Chantix, Chantix Starting Month Pak   There may be other brand names for this medicine.  When This Medicine Should Not Be Used:   This medicine is not right for everyone. Do not use it if you had an allergic reaction to varenicline.  How to Use This Medicine:   Tablet  ?? Take your medicine as directed. Your dose or schedule may need to be changed to find what works best for you.  ?? Tell your doctor what date you have set to stop smoking. This medicine needs to be started 1 week before that date.  ?? It is best to take this medicine with a full glass of water after you eat.  ?? This medicine should come with a Medication Guide. Ask your pharmacist for a copy if you do not have one.  ?? Missed dose: Take a dose as soon as you remember. If it is almost time for your next dose, wait until then and take a regular dose. Do not take extra medicine to make up for a missed dose.  ?? Store the medicine in a closed container at room temperature, away from heat, moisture, and direct light.  Drugs and Foods to Avoid:   Ask your doctor or pharmacist before using any other medicine, including over-the-counter medicines, vitamins, and herbal products.  ?? Some medicines can affect how varenicline works. Tell your doctor if you are using any of the following:  ?? Insulin  ?? Theophylline  ?? A blood thinner (such as warfarin)  ?? This medicine can affect your ability to tolerate alcohol. Limit the amount of alcohol that you drink until you know how this medicine affects you.  Warnings While Using This Medicine:   ?? Tell your doctor if you are pregnant or breastfeeding, or if you have kidney disease, heart or blood vessel problems, angina, or a history of heart attack, stroke, depression or mental health problems, or seizures.  ?? For some people, this medicine may increase mental or emotional  problems. This may lead to thoughts of suicide and violence. Talk with your doctor right away if you have any thought or behavior changes that concern you. Tell your doctor if you or anyone in your family has a history of bipolar disorder or suicide attempts.  ?? This medicine may cause the following problems:  ?? Increased risk of heart attack or stroke  ?? Serious skin reactions  ?? This medicine may cause you to become dizzy or drowsy, or have trouble concentrating. Do not drive or do anything else that could be dangerous until you know this medicine affects you.  ?? Your doctor will check your progress and the effects of this medicine at regular visits. Keep all appointments.  ?? Keep all medicine out of the reach of children. Never share your medicine with anyone.  Possible Side Effects While Using This Medicine:   Call your doctor right away if you notice any of these side effects:  ?? Allergic reaction: Itching or hives, swelling in your face or hands, swelling or tingling in your mouth or throat, chest tightness, trouble breathing  ?? Blistering, peeling, red skin rash  ?? Chest pain, fast, pounding, or uneven heartbeat  ?? Feeling anxious, depressed, restless, or irritable  ?? Numbness or weakness on one side of your body, sudden or severe headache, problems with vision, speech, or walking  ??   Seeing or hearing things that are not really there  ?? Seizures  ?? Thoughts of hurting yourself or others, unusual moods or behaviors  If you notice these less serious side effects, talk with your doctor:   ?? Headache  ?? Nausea, constipation, gas  ?? Trouble sleeping, unusual dreams  If you notice other side effects that you think are caused by this medicine, tell your doctor.   Call your doctor for medical advice about side effects. You may report side effects to FDA at 1-800-FDA-1088  ?? 2016 [employer] Information is for End User's use only and may not be sold, redistributed or otherwise used for commercial  purposes.  The above information is an educational aid only. It is not intended as medical advice for individual conditions or treatments. Talk to your doctor, nurse or pharmacist before following any medical regimen to see if it is safe and effective for you.

## 2014-07-29 ENCOUNTER — Ambulatory Visit
Admit: 2014-07-29 | Discharge: 2014-07-29 | Payer: PRIVATE HEALTH INSURANCE | Attending: Physician Assistant | Primary: Physician Assistant

## 2014-07-29 ENCOUNTER — Inpatient Hospital Stay: Admit: 2014-07-29 | Payer: BLUE CROSS/BLUE SHIELD | Primary: Physician Assistant

## 2014-07-29 DIAGNOSIS — Z01419 Encounter for gynecological examination (general) (routine) without abnormal findings: Secondary | ICD-10-CM

## 2014-07-29 LAB — CBC WITH AUTOMATED DIFF
ABS. BASOPHILS: 0.1 10*3/uL — ABNORMAL HIGH (ref 0.0–0.06)
ABS. EOSINOPHILS: 0.2 10*3/uL (ref 0.0–0.4)
ABS. LYMPHOCYTES: 1.6 10*3/uL (ref 0.9–3.6)
ABS. MONOCYTES: 0.5 10*3/uL (ref 0.05–1.2)
ABS. NEUTROPHILS: 2.4 10*3/uL (ref 1.8–8.0)
BASOPHILS: 1 % (ref 0–2)
EOSINOPHILS: 4 % (ref 0–5)
HCT: 40.2 % (ref 35.0–45.0)
HGB: 12.3 g/dL (ref 12.0–16.0)
LYMPHOCYTES: 34 % (ref 21–52)
MCH: 23.2 PG — ABNORMAL LOW (ref 24.0–34.0)
MCHC: 30.6 g/dL — ABNORMAL LOW (ref 31.0–37.0)
MCV: 75.8 FL (ref 74.0–97.0)
MONOCYTES: 11 % — ABNORMAL HIGH (ref 3–10)
MPV: 10 FL (ref 9.2–11.8)
NEUTROPHILS: 50 % (ref 40–73)
PLATELET: 306 10*3/uL (ref 135–420)
RBC: 5.3 M/uL (ref 4.20–5.30)
RDW: 15.9 % — ABNORMAL HIGH (ref 11.6–14.5)
WBC: 4.9 10*3/uL (ref 4.6–13.2)

## 2014-07-29 LAB — METABOLIC PANEL, COMPREHENSIVE
A-G Ratio: 1.1 (ref 0.8–1.7)
ALT (SGPT): 35 U/L (ref 13–56)
AST (SGOT): 19 U/L (ref 15–37)
Albumin: 3.9 g/dL (ref 3.4–5.0)
Alk. phosphatase: 68 U/L (ref 45–117)
Anion gap: 7 mmol/L (ref 3.0–18)
BUN/Creatinine ratio: 20 (ref 12–20)
BUN: 17 MG/DL (ref 7.0–18)
Bilirubin, total: 0.3 MG/DL (ref 0.2–1.0)
CO2: 29 mmol/L (ref 21–32)
Calcium: 9.3 MG/DL (ref 8.5–10.1)
Chloride: 104 mmol/L (ref 100–108)
Creatinine: 0.85 MG/DL (ref 0.6–1.3)
GFR est AA: >60 mL/min/{1.73_m2} (ref >60)
GFR est non-AA: >60 mL/min/{1.73_m2} (ref >60)
Globulin: 3.7 g/dL (ref 2.0–4.0)
Glucose: 93 mg/dL (ref 74–99)
Potassium: 4.4 mmol/L (ref 3.5–5.5)
Protein, total: 7.6 g/dL (ref 6.4–8.2)
Sodium: 140 mmol/L (ref 136–145)

## 2014-07-29 LAB — LIPID PANEL
CHOL/HDL Ratio: 2.2 (ref 0–5.0)
Cholesterol, total: 193 MG/DL (ref <200)
HDL Cholesterol: 86 MG/DL — ABNORMAL HIGH (ref 40–60)
LDL, calculated: 95 MG/DL (ref 0–100)
Triglyceride: 60 MG/DL (ref <150)
VLDL, calculated: 12 MG/DL

## 2014-07-29 LAB — TSH 3RD GENERATION: TSH: 0.42 u[IU]/mL (ref 0.36–3.74)

## 2014-07-29 NOTE — Patient Instructions (Signed)
Mammogram: About This Test  What is it?  A mammogram is an X-ray of the breast that is used to screen for breast cancer. This test can find tumors that are too small for you or your doctor to feel. Cancer is most easily treated and cured when it is found at an early stage.  Why is this test done?  A mammogram is done to:  ?? Look for breast cancer in women who don't have symptoms.  ?? Find breast cancer in women who have symptoms. Symptoms of breast cancer may include a lump or thickening in the breast, nipple discharge, or dimpling of the skin on one area of the breast.  ?? Find an area of suspicious breast tissue to remove for an exam under a microscope (biopsy).  How can you prepare for the test?  ?? Tell your doctor if you:  ?? Are or might be pregnant.  ?? Are breastfeeding.  ?? Have breast implants.  ?? Have previously had a breast biopsy.  ?? On the day of the test, don't use any deodorant, perfume, powders, or ointments.  What happens before the test?  ?? You will need to take off any jewelry that might interfere with the X-ray pictures.  ?? You will need to take off your clothes above the waist.  ?? You will be given a cloth or paper gown to use during the test.  What happens during the test?  ?? You usually stand during a mammogram.  ?? One at a time, your breasts will be placed on a flat plate that contains the X-ray film.  ?? Another plate is then pressed firmly against your breast to help flatten out the breast tissue. You may be asked to lift your arm.  ?? For a few seconds while the X-ray picture is being taken, you will need to hold your breath.  ?? At least two pictures are taken of each breast. One is taken from the top and one from the side.  What else should you know about the test?  ?? The X-ray plate will feel cold when you place your breast on it. Having your breasts flattened and squeezed isn't comfortable. But it is necessary to flatten out the breast tissue to get the best pictures.   ?? Mammograms do not prevent breast cancer or reduce a woman's risk of developing cancer.  ?? Most things that are found during a mammogram are not breast cancer.  How long does the test take?  ?? The test will take about 10 to 15 minutes. You may be in the clinic for up to an hour.  What happens after the test?  ?? You will probably be able to go home right away.  ?? You can go back to your usual activities right away.  Follow-up care is a key part of your treatment and safety. Be sure to make and go to all appointments, and call your doctor if you are having problems. It's also a good idea to keep a list of the medicines you take. Ask your doctor when you can expect to have your test results.  Where can you learn more?  Go to http://www.healthwise.net/[employer]  Enter Z238 in the search box to learn more about "Mammogram: About This Test."  ?? 2006-2016 [employer]. Care instructions adapted under license by [employer] (which disclaims liability or warranty for this information). This care instruction is for use with your licensed healthcare professional. If you have questions about a   medical condition or this instruction, always ask your healthcare professional. Healthwise, Incorporated disclaims any warranty or liability for your use of this information.  Content Version: 10.9.538570; Current as of: January 02, 2014

## 2014-07-29 NOTE — Progress Notes (Signed)
HISTORY OF PRESENT ILLNESS  Megan Carter is a 49 y.o. female.  HPI  Megan Carter is a 49 y.o. female who presents to the office today for Well woman exam.   Her last Pap was many years ago. She has not been checked for STDs in a long time. She has been with the same partner since January. She has no complaints of vaginal discomfort or discharge. No abnormal vaginal bleeding, s/p hysterectomy. Does not know if this is a total or partial. She does not perform monthly self breast exams. There is breast cancer in her cousins (her mother's twin sister's children). There is no other significant family history.    She is still concerned about weight loss. She has lost one pound since her last visit. Her weight loss began after she started working for [employer]. She says she hardly eats while at work, and she works 12 hour shifts. She denies fevers, chills, night sweats, chronic cough, hemoptysis, blood in stool, chest pain, SOB, syncope. There is no N/V, abdominal pain or diarrhea.    She has a remote hx of drug use, cocaine and crystal meth. She denies IV drug use. She has been clean for 5 years. She continues to smoke cigarettes, almost 2 ppd. She was prescribed Chantix last month but has not had the money to afford it yet.         Chief Complaint   Patient presents with   ??? Well Woman       Current Outpatient Prescriptions on File Prior to Visit   Medication Sig Dispense Refill   ??? varenicline (CHANTIX STARTER PAK) 0.5 mg (11)- 1 mg (42) DsPk Take as directed 1 Dose Pack 0   ??? albuterol (PROVENTIL HFA, VENTOLIN HFA, PROAIR HFA) 90 mcg/actuation inhaler Take 2 Puffs by inhalation every four (4) hours as needed for Wheezing. 1 Inhaler 2     No current facility-administered medications on file prior to visit.     No Known Allergies  Past Medical History   Diagnosis Date   ??? Asthma      History   Smoking status   ??? Current Every Day Smoker -- 2.00 packs/day   ??? Start date: 04/09/1977   Smokeless tobacco    ??? Never Used     History   Alcohol Use No     Family History   Problem Relation Age of Onset   ??? Heart Failure Mother    ??? Arthritis-osteo Mother    ??? Cancer Sister    ??? Hypertension Brother    ??? Arthritis-osteo Maternal Grandmother    ??? Arthritis-osteo Maternal Grandfather    ??? Heart Attack Maternal Grandmother      Review of Systems   Constitutional: Positive for weight loss. Negative for fever, chills, malaise/fatigue and diaphoresis.   Respiratory: Negative for cough, hemoptysis, shortness of breath and wheezing.    Cardiovascular: Negative for chest pain, palpitations and leg swelling.   Gastrointestinal: Negative for nausea, vomiting, abdominal pain, diarrhea, constipation, blood in stool and melena.   Musculoskeletal: Negative for myalgias and joint pain.   Skin: Negative for rash.   Neurological: Negative for dizziness, tingling, focal weakness, weakness and headaches.   Endo/Heme/Allergies: Negative for environmental allergies. Does not bruise/bleed easily.   Psychiatric/Behavioral: Negative for depression and substance abuse. The patient is not nervous/anxious.        BP 121/80 mmHg   Pulse 62   Temp(Src) 97.5 ??F (36.4 ??C) (Oral)   Resp  18   Ht  (1.651 m)   Wt 139 lb 3.2 oz (63.141 kg)   BMI 23.16 kg/m2   SpO2 99%    Physical Exam   Constitutional: She is oriented to person, place, and time. She appears well-developed and well-nourished. No distress.   HENT:   Head: Normocephalic.   Mouth/Throat: Oropharynx is clear and moist.   Eyes: Conjunctivae are normal. No scleral icterus.   Neck: Normal range of motion. Neck supple. No thyromegaly present.   Cardiovascular: Normal rate, regular rhythm and normal heart sounds.    No murmur heard.  Pulmonary/Chest: Effort normal and breath sounds normal. No respiratory distress. She has no wheezes. She has no rales. Right breast exhibits no inverted nipple, no mass, no nipple discharge, no skin change and no  tenderness. Left breast exhibits no inverted nipple, no mass, no nipple discharge, no skin change and no tenderness. Breasts are symmetrical.   Abdominal: Soft. She exhibits no distension and no mass. There is no tenderness. There is no guarding.   Genitourinary: Vagina normal. No breast swelling, tenderness or discharge. Pelvic exam was performed with patient supine. No vaginal discharge found.   Musculoskeletal: She exhibits no edema.   Lymphadenopathy:     She has no cervical adenopathy.   Neurological: She is alert and oriented to person, place, and time.   Skin: Skin is warm and dry. No rash noted.   Psychiatric: She has a normal mood and affect. Her behavior is normal. Judgment and thought content normal.   Nursing note and vitals reviewed.      ASSESSMENT and PLAN    ICD-10-CM ICD-9-CM    1. Well woman exam with routine gynecological exam Z01.419 V72.31 CBC WITH AUTOMATED DIFF      METABOLIC PANEL, COMPREHENSIVE      LIPID PANEL      TSH 3RD GENERATION      PAP IG, CT-NG-TV, RFX APTIMA HPV ASCUS ([id number])      HIV 1/2 AB SCREEN W RFLX CONFIRM      MAM MAMMO BI SCREENING DIGTL   2. Cigarette nicotine dependence without complication F17.210 305.1    3. Unintentional weight loss R63.4 783.21 CBC WITH AUTOMATED DIFF      METABOLIC PANEL, COMPREHENSIVE      TSH 3RD GENERATION      HIV 1/2 AB SCREEN W RFLX CONFIRM      Will check Pap and labs. Once results are available, we will contact the patient. Referral sent for Mammogram.   She was counseled on smoking cessation for 3 minutes. She wants to quit, but there is a lot of personal issues going on and making her stressed and she does not think she can quit at this time. She still has the prescription for Chantix and would like to try it once she can afford this.  Will order labs to evaluate weight loss. She will need to try and take more breaks, if possible, at work, so she can eat more frequently.    Reviewed medication and side effects. Patient agrees with the plan and verbalizes understanding.     Follow-up Disposition:  Return in about 1 year (around 07/29/2015) for Annual exam.      Lennox Grumbles, PA-C  07/29/2014

## 2014-07-29 NOTE — Progress Notes (Signed)
Megan Carter is a 49 y.o. female here for well women visit. No complaints at this time.     1. Have you been to the ER, urgent care clinic or hospitalized since your last visit? NO.   2. Have you seen or consulted any other health care providers outside of the [facility] since your last visit (Include any pap smears or colon screening)? NO

## 2014-07-30 ENCOUNTER — Inpatient Hospital Stay: Admit: 2014-07-30 | Payer: BLUE CROSS/BLUE SHIELD | Primary: Physician Assistant

## 2014-07-30 LAB — HIV 1/2 AB SCREEN W RFLX CONFIRM
HIV 1/2 Interpretation: NONREACTIVE
HIV1/2 INTERPRETATION, HHIVI: NONREACTIVE

## 2014-07-31 LAB — CT/NG/T.VAGINALIS AMPLIFICATION
Chlamydia amplification: NEGATIVE
N. gonorrhoeae amplification: NEGATIVE
Trichomonas amplification: NEGATIVE

## 2014-07-31 NOTE — Progress Notes (Signed)
Quick Note:        STDs are negative. Waiting on pap results    ______

## 2014-07-31 NOTE — Progress Notes (Signed)
Quick Note:        Lab results look okay. Cholesterol levels are good, continue current regimen.    ______

## 2014-08-03 NOTE — Progress Notes (Signed)
Quick Note:        Left message for patient to call.    ______

## 2014-08-03 NOTE — Progress Notes (Signed)
Quick Note:        Spoke with patient, aware of results.    ______

## 2014-08-03 NOTE — Progress Notes (Signed)
Quick Note:        Spoke with patient, aware of results.    ______

## 2014-08-06 ENCOUNTER — Encounter

## 2014-08-06 NOTE — Progress Notes (Signed)
Quick Note:        Pap smear came back with abnormal cells. I would like to send her to GYN for evaluation    ______

## 2014-08-06 NOTE — Progress Notes (Signed)
Quick Note:        Pt aware of results and needs [place] to choose a GYN for her in this area.    ______

## 2014-08-12 ENCOUNTER — Inpatient Hospital Stay: Admit: 2014-08-12 | Payer: BLUE CROSS/BLUE SHIELD | Attending: Physician Assistant | Primary: Physician Assistant

## 2014-08-12 DIAGNOSIS — Z1231 Encounter for screening mammogram for malignant neoplasm of breast: Secondary | ICD-10-CM

## 2014-08-13 NOTE — Progress Notes (Signed)
On 07/07/14, [facility] called and reminded patient of upcoming appointment.  Patient was not available therefore a message was left for the patient.

## 2014-08-17 NOTE — Progress Notes (Signed)
On 08/13/14, Per medical facility the patient was in attendance for scheduled appointment.

## 2014-09-24 NOTE — Progress Notes (Signed)
Checking chart to see if requested notes from [facility] have been received, not in media as of yet

## 2015-02-04 NOTE — Progress Notes (Signed)
Checking to see if requested office notes have been received and scanned in to media

## 2015-10-16 ENCOUNTER — Emergency Department (HOSPITAL_COMMUNITY): Payer: Self-pay

## 2015-10-16 ENCOUNTER — Emergency Department (HOSPITAL_COMMUNITY)
Admission: EM | Admit: 2015-10-16 | Discharge: 2015-10-16 | Disposition: A | Discharge status: Home / Self Care | Payer: Self-pay | Attending: Emergency Medicine | Admitting: Emergency Medicine

## 2015-10-16 ENCOUNTER — Encounter (HOSPITAL_COMMUNITY): Payer: Self-pay

## 2015-10-16 DIAGNOSIS — R030 Elevated blood-pressure reading, without diagnosis of hypertension: Secondary | ICD-10-CM | POA: Insufficient documentation

## 2015-10-16 DIAGNOSIS — IMO0001 Reserved for inherently not codable concepts without codable children: Secondary | ICD-10-CM

## 2015-10-16 DIAGNOSIS — R0602 Shortness of breath: Secondary | ICD-10-CM | POA: Insufficient documentation

## 2015-10-16 DIAGNOSIS — R918 Other nonspecific abnormal finding of lung field: Secondary | ICD-10-CM | POA: Insufficient documentation

## 2015-10-16 DIAGNOSIS — F17210 Nicotine dependence, cigarettes, uncomplicated: Secondary | ICD-10-CM | POA: Insufficient documentation

## 2015-10-16 DIAGNOSIS — R590 Localized enlarged lymph nodes: Secondary | ICD-10-CM | POA: Insufficient documentation

## 2015-10-16 LAB — CBC WITH DIFFERENTIAL/PLATELET
BASOS ABS: 0 10*3/uL (ref 0.0–0.1)
Basophils Relative: 1 %
EOS PCT: 8 %
Eosinophils Absolute: 0.4 10*3/uL (ref 0.0–0.7)
HCT: 39.3 % (ref 36.0–46.0)
Hemoglobin: 12 g/dL (ref 12.0–15.0)
Lymphocytes Relative: 41 %
Lymphs Abs: 1.8 10*3/uL (ref 0.7–4.0)
MCH: 22.6 pg — ABNORMAL LOW (ref 26.0–34.0)
MCHC: 30.5 g/dL (ref 30.0–36.0)
MCV: 74.2 fL — AB (ref 78.0–100.0)
MONO ABS: 0.4 10*3/uL (ref 0.1–1.0)
MONOS PCT: 10 %
NEUTROS PCT: 40 %
Neutro Abs: 1.8 10*3/uL (ref 1.7–7.7)
PLATELETS: 316 10*3/uL (ref 150–400)
RBC: 5.3 MIL/uL — AB (ref 3.87–5.11)
RDW: 14.9 % (ref 11.5–15.5)
WBC: 4.4 10*3/uL (ref 4.0–10.5)

## 2015-10-16 LAB — BASIC METABOLIC PANEL
ANION GAP: 8 (ref 5–15)
BUN: 11 mg/dL (ref 6–20)
CALCIUM: 9.5 mg/dL (ref 8.9–10.3)
CO2: 25 mmol/L (ref 22–32)
Chloride: 104 mmol/L (ref 101–111)
Creatinine, Ser: 0.78 mg/dL (ref 0.44–1.00)
GLUCOSE: 92 mg/dL (ref 65–99)
POTASSIUM: 4.3 mmol/L (ref 3.5–5.1)
SODIUM: 137 mmol/L (ref 135–145)

## 2015-10-16 LAB — I-STAT TROPONIN, ED: TROPONIN I, POC: 0.01 ng/mL (ref 0.00–0.08)

## 2015-10-16 LAB — D-DIMER, QUANTITATIVE (NOT AT ARMC)

## 2015-10-16 MED ORDER — HYDROCHLOROTHIAZIDE 12.5 MG PO TABS: 25.0000 mg | ORAL_TABLET | Freq: Every day | ORAL | 0 refills | Status: DC

## 2015-10-16 MED ORDER — HYDROCHLOROTHIAZIDE 12.5 MG PO CAPS
12.5000 mg | ORAL_CAPSULE | Freq: Every day | ORAL | Status: DC
  Administered 2015-10-16: 12.5 mg via ORAL
  Filled 2015-10-16: qty 1

## 2015-10-16 MED ORDER — SODIUM CHLORIDE 0.9 % IV BOLUS (SEPSIS)
500.0000 mL | Freq: Once | INTRAVENOUS | Status: AC
  Administered 2015-10-16: 500 mL via INTRAVENOUS

## 2015-10-16 MED ORDER — ACETAMINOPHEN 325 MG PO TABS
650.0000 mg | ORAL_TABLET | Freq: Once | ORAL | Status: AC
  Administered 2015-10-16: 650 mg via ORAL
  Filled 2015-10-16: qty 2

## 2015-10-16 MED ORDER — IOPAMIDOL (ISOVUE-300) INJECTION 61%
INTRAVENOUS | Status: AC
  Administered 2015-10-16: 75 mL
  Filled 2015-10-16: qty 75

## 2015-10-16 MED ORDER — ALBUTEROL SULFATE HFA 108 (90 BASE) MCG/ACT IN AERS
2.0000 | INHALATION_SPRAY | Freq: Four times a day (QID) | RESPIRATORY_TRACT | Status: DC | PRN
  Administered 2015-10-16: 2 via RESPIRATORY_TRACT
  Filled 2015-10-16: qty 6.7

## 2015-10-16 NOTE — ED Notes (Signed)
Patient Alert and oriented X4. Stable and ambulatory. Patient verbalized understanding of the discharge instructions.  Patient belongings were taken by the patient.  

## 2015-10-16 NOTE — ED Triage Notes (Signed)
Patient complains of shortness of breath worse with exertion x 2 days. denies CP. Stats that she has had dry cough with same. No wheezing noted.

## 2015-10-16 NOTE — ED Notes (Signed)
Patient transported to CT 

## 2015-10-16 NOTE — ED Provider Notes (Signed)
Medical screening examination/treatment/procedure(s) were conducted as a shared visit with non-physician practitioner(s) and myself.  I personally evaluated the patient during the encounter. Briefly, the patient is a 50 y.o. female long history of smoking presents to the ED with worsening shortness of breath and wheezing for the past 2 days. Likely COPD exacerbation. Symptoms improved after breathing treatment. Presentation inconsistent with ACS. Dimer negative, doubt vomiting embolism. Chest x-ray without evidence of pneumonia, however did note hiatal nodules. Given her history smoking, obtained CT with contrast which revealed multiple pulmonary nodules and mediastinal lymphadenopathy with extensive and differential including infectious or inflammatory process, neoplastic or sarcoidosis. Consulted pulmonology who will arrange outpatient follow-up for continued workup and management.   Patient safe for discharge with close pulmonary follow-up.   EKG Interpretation  Date/Time:  Saturday October 16 2015 15:57:04 EDT Ventricular Rate:  70 PR Interval:  122 QRS Duration: 72 QT Interval:  382 QTC Calculation: 412 R Axis:   72 Text Interpretation:  Normal sinus rhythm Normal ECG Confirmed by [facility] MD, PEDRO ([id number]) on 10/16/2015 6:06:35 PM           Fatima Blank, MD 10/17/15 [id number]

## 2015-10-16 NOTE — ED Notes (Signed)
Pt placed back on bedside monitor per RN.

## 2015-10-16 NOTE — Discharge Instructions (Addendum)
Read the information below.   Your labs were re-assuring.  CT of your chest is concerning for pulmonary nodules and lymph node enlargement. It is very important that you follow up with Dr. Ashok Cordia of Pulmonology for further evaluation. His office will call you on Tuesday to schedule an appointment. If you do not hear from them, call on Wednesday.  I have provided an inhaler for use if you experience shortness of breath or wheezing.  Your blood pressure was elevated. You are being started on a blood pressure medication. Please take as directed. Minimize sodium intake in your diet.  It is important that you establish a primary provider. I have provided the contact information above for [facility]. Please call to establish care.  Use the prescribed medication as directed.  Please discuss all new medications with your pharmacist.  You may return to the Emergency Department at any time for worsening condition or any new symptoms that concern you. Return to ED if you develop fever, worsening shortness of breath, chest pain, productive cough, weakness, numbness, facial droop, slurred speech, or loss of conciousness.

## 2015-10-16 NOTE — ED Provider Notes (Signed)
[facility] DEPT Provider Note   CSN: [id number] Arrival date & time: 10/16/15  1536     History   Chief Complaint Chief Complaint  Patient presents with  . Shortness of Breath    HPI Sarah Cruz is a 50 y.o. female.  Sarah Cruz is a 50 y.o. female presents to ED with complaint of shortness of breath. Patient states she has experienced worsening shortness of breath, dyspnea on exertion, and wheezing over the last 2 days. She endorses an intermittent non-productive cough, no hemoptysis. She has chronic sinus congestion and headache secondary to sinus congestion. Headache is her typical headache 8/10, pressure, right temple. She denies fever, chills, diaphoresis, trouble swallowing, changes in vision, chest pain, chest tightness, abdominal pain, nausea, vomiting, diarrhea, constipation, dysuria, hematuria, rash, dizziness, lightheadedness, numbness, weakness, facial droop, slurred speech, or seizures. She denies any recent long distance travel/surgery/hospitalization. Patient states she had "unbalanced cells" on her cervix; however, she is s/p hysterectomy. No h/o blood clots. Family h/o blood clots (sister, passed from cancer).       History reviewed. No pertinent past medical history.  There are no active problems to display for this patient.   History reviewed. No pertinent surgical history.  OB History    No data available       Home Medications    Prior to Admission medications   Medication Sig Start Date End Date Taking? Authorizing Provider  acetaminophen (TYLENOL) 500 MG tablet Take 1,000 mg by mouth 2 (two) times daily as needed for mild pain or headache.   Yes Historical Provider, MD  loratadine (CLARITIN) 10 MG tablet Take 10 mg by mouth daily as needed (for sinus).   Yes Historical Provider, MD  hydrochlorothiazide (HYDRODIURIL) 12.5 MG tablet Take 2 tablets (25 mg total) by mouth daily. 10/16/15   Roxanna Mew, PA-C    Family History No  family history on file.  Social History Social History  Substance Use Topics  . Smoking status: Current Every Day Smoker    Types: Cigarettes  . Smokeless tobacco: Never Used  . Alcohol use Not on file     Allergies   Review of patient's allergies indicates no known allergies.   Review of Systems Review of Systems  Constitutional: Negative for chills, diaphoresis and fever.  HENT: Positive for sinus pressure ( chronic). Negative for trouble swallowing.   Eyes: Negative for visual disturbance.  Respiratory: Positive for cough ( non-productive) and shortness of breath.   Cardiovascular: Negative for chest pain and leg swelling.  Gastrointestinal: Negative for abdominal pain, blood in stool, constipation, diarrhea, nausea and vomiting.  Genitourinary: Negative for dysuria and hematuria.  Musculoskeletal: Negative for neck pain.  Skin: Negative for rash.  Neurological: Positive for headaches. Negative for dizziness, seizures, syncope, facial asymmetry, speech difficulty, weakness, light-headedness and numbness.     Physical Exam Updated Vital Signs BP 164/87 (BP Location: Right Arm)   Pulse (!) 58   Temp 98.1 F (36.7 C) (Oral)   Resp 24   SpO2 100%   Physical Exam  Constitutional: She appears well-developed and well-nourished. No distress.  HENT:  Head: Normocephalic and atraumatic.  Mouth/Throat: Oropharynx is clear and moist. No oropharyngeal exudate.  Eyes: Conjunctivae and EOM are normal. Pupils are equal, round, and reactive to light. Right eye exhibits no discharge. Left eye exhibits no discharge. No scleral icterus.  Neck: Normal range of motion. Neck supple.  Cardiovascular: Normal rate, regular rhythm, normal heart sounds and intact distal pulses.  No murmur heard. Pulmonary/Chest: Effort normal and breath sounds normal. Tachypnea noted. No respiratory distress.  Abdominal: Soft. Bowel sounds are normal. She exhibits no distension. There is no tenderness.  There is no rigidity, no rebound and no guarding.  Musculoskeletal: Normal range of motion.  Lymphadenopathy:    She has no cervical adenopathy.  Neurological: She is alert. She is not disoriented. Coordination normal. GCS eye subscore is 4. GCS verbal subscore is 5. GCS motor subscore is 6.  Mental Status:  Alert, thought content appropriate, able to give a coherent history. Speech fluent without evidence of aphasia. Able to follow 2 step commands without difficulty.  Cranial Nerves:  II:  Peripheral visual fields grossly normal, pupils equal, round, reactive to light III,IV, VI: ptosis not present, extra-ocular motions intact bilaterally  V,VII: smile symmetric, facial light touch sensation equal VIII: hearing grossly normal to voice  X: uvula elevates symmetrically  XI: bilateral shoulder shrug symmetric and strong XII: midline tongue extension without fassiculations Motor:  Normal tone. 5/5 in upper and lower extremities bilaterally including strong and equal grip strength and dorsiflexion/plantar flexion Sensory: light touch normal in all extremities. Cerebellar: normal finger-to-nose with bilateral upper extremities Gait: normal gait and balance CV: distal pulses palpable throughout   Skin: Skin is warm and dry. She is not diaphoretic.  Psychiatric: She has a normal mood and affect. Her behavior is normal.     ED Treatments / Results  Labs (all labs ordered are listed, but only abnormal results are displayed) Labs Reviewed  CBC WITH DIFFERENTIAL/PLATELET - Abnormal; Notable for the following:       Result Value   RBC 5.30 (*)    MCV 74.2 (*)    MCH 22.6 (*)    All other components within normal limits  BASIC METABOLIC PANEL  D-DIMER, QUANTITATIVE (NOT AT [facility])  I-STAT TROPOININ, ED    EKG  EKG Interpretation  Date/Time:  Saturday October 16 2015 15:57:04 EDT Ventricular Rate:  70 PR Interval:  122 QRS Duration: 72 QT Interval:  382 QTC Calculation: 412 R  Axis:   72 Text Interpretation:  Normal sinus rhythm Normal ECG Confirmed by [facility] MD, PEDRO ([id number]) on 10/16/2015 6:06:35 PM       Radiology Dg Chest 2 View  Result Date: 10/16/2015 CLINICAL DATA:  Shortness of breath, cough and wheezing for 2 days. EXAM: CHEST  2 VIEW COMPARISON:  None. FINDINGS: Right hilar prominence is noted. Upper limits normal heart size noted. Ill-defined nodular opacities within both lungs noted. No airspace disease, pleural effusion, mass or pneumothorax noted. No acute bony abnormalities are present. IMPRESSION: Right hilar prominence and ill-defined bilateral pulmonary nodules. Chest CT with contrast recommended for further evaluation. Electronically Signed   By: Margarette Canada M.D.   On: 10/16/2015 16:22   Ct Chest W Contrast  Result Date: 10/16/2015 CLINICAL DATA:  Shortness of breath. Bilateral pulmonary nodules. Current smoker. EXAM: CT CHEST WITH CONTRAST TECHNIQUE: Multidetector CT imaging of the chest was performed during intravenous contrast administration. CONTRAST:  47mL ISOVUE-300 IOPAMIDOL (ISOVUE-300) INJECTION 61% COMPARISON:  Chest radiograph 10/16/2015 FINDINGS: Cardiovascular: Normal heart size. Normal caliber thoracic aorta. No aortic dissection. Great vessel origins are patent. Central pulmonary arteries are well opacified. No evidence of large central pulmonary embolus. Mediastinum/Nodes: Lymphadenopathy demonstrated throughout the mediastinum and bilateral hilar regions. Mild prominence of axillary lymph nodes. Subcarinal lymph nodes measure up to about 2.2 cm diameter, right hilar lymph nodes 1.5 cm, and left hilar lymph nodes 1.8 cm diameter.  Esophagus is decompressed. Lungs/Pleura: Multiple diffuse irregular nodules demonstrated throughout both lungs with variable sizes. Some the nodules, particularly in the upper lungs demonstrate mild cavitation. All pulmonary lobes are involved. Mild wall thickening of the bronchi without if evidence of bronchiectasis  or mucus plugging. No pleural effusions. No pneumothorax. Upper Abdomen: Visualized upper abdominal organs are grossly unremarkable. Musculoskeletal: Mild degenerative changes in the spine. No destructive bone lesions. IMPRESSION: Multiple irregular nodules of varying size is demonstrated diffusely throughout both lungs with bilateral hilar and diffuse mediastinal lymphadenopathy. Changes are most likely to represent an infectious or inflammatory process. Consider atypical infection such as TB, atypical TB, or fungal infection. Lymphoma, septic emboli, or metastatic disease could also possibly have this appearance. Patient should be worked-up for infectious or neoplastic etiologies and at minimum, a 3-6 month follow-up study should be obtained. Electronically Signed   By: Lucienne Capers M.D.   On: 10/16/2015 20:35    Procedures Procedures (including critical care time)  Medications Ordered in ED Medications  hydrochlorothiazide (MICROZIDE) capsule 12.5 mg (12.5 mg Oral Given 10/16/15 1843)  albuterol (PROVENTIL HFA;VENTOLIN HFA) 108 (90 Base) MCG/ACT inhaler 2 puff (2 puffs Inhalation Given 10/16/15 2138)  sodium chloride 0.9 % bolus 500 mL (0 mLs Intravenous Stopped 10/16/15 2030)  acetaminophen (TYLENOL) tablet 650 mg (650 mg Oral Given 10/16/15 1843)  iopamidol (ISOVUE-300) 61 % injection (75 mLs  Contrast Given 10/16/15 2003)     Initial Impression / Assessment and Plan / ED Course  I have reviewed the triage vital signs and the nursing notes.  Pertinent labs & imaging results that were available during my care of the patient were reviewed by me and considered in my medical decision making (see chart for details).  Clinical Course  Value Comment By Time  DG Chest 2 View Multiple pulmonary nodules noted b/l. No evidence of PNA, effusion, or PTX.  Roxanna Mew, [place] 09/02 1700  CT Chest W Contrast Reviewed Roxanna Mew, PA-C 09/02 2045    Patient presents to ED complaint of SOB.  Patient is afebrile and non-toxic appearing in NAD. Vital signs remarkable for elevated blood pressure and tachypnea. Lungs are clear. Respirations are unlabored. Patient able to talk in complete sentences. Headache is her usual headache, low suspicion for ICH, SAH, or meningitis - afebrile, no changes in vision, no nuchal rigidity, nml neurologic exam. Will check BMP, CBC, troponin, d-dimer, CXR, and EKG. IVF fluids, pain medication, and blood pressure medicine given.   BMP and CBC re-assuring. Troponin negative. EKG shows NSR, nml intervals, no ST/T wave changes, no evidence of right heart strain. D-dimer negative - low suspicion for PE. CXR concerning for right hilar prominence and multiple b/l pulmonary nodules, recommend CT with contrast. CT with contrast concerning for multiple pulmonary nodules and mediastinal lymphadenopathy - ?infectious vs. ?inflammatory vs. ?neoplastic. Consult to pulmonology for further guidance regarding CT findings.   Spoke with Dr. Ashok Cordia of Pulmonary Disease, greatly appreciated his time and input. Will follow up OP for further evaluation and management of pulmonary nodules and mediastinal lymphadenopathy. On re-evaluation, patient endorses improvement in sxs. Respirations during re-assessment 18breaths/min. Patient able to ambulate without sxs and O2 sats at 100%. Blood pressure improved. Discussed results and plan with patient. Dr. Ammie Dalton office to call on Tuesday to schedule follow up appointment. Albuterol inhaler provided. Rx HCTZ for HTN. Referral information for [facility] provided and encouraged patient to establish PCP for management of HTN. Strict return precautions discussed. Patient  voiced understanding and is agreeable.    Final Clinical Impressions(s) / ED Diagnoses   Final diagnoses:  Shortness of breath  Elevated blood pressure    New Prescriptions Discharge Medication List as of 10/16/2015  9:33 PM    START taking these  medications   Details  hydrochlorothiazide (HYDRODIURIL) 12.5 MG tablet Take 2 tablets (25 mg total) by mouth daily., Starting Sat 10/16/2015, Print         [place], [place] 10/16/15 2235

## 2015-10-16 NOTE — ED Notes (Signed)
C/o sob for 2 days no cough she is a smoker no temp her bp is high but she takes no med for that

## 2015-10-20 ENCOUNTER — Telehealth: Payer: Self-pay | Admitting: *Deleted

## 2015-10-20 NOTE — Telephone Encounter (Signed)
LMTCB X1 appt was scheduled for 9/8 at 9:30 AM for consult

## 2015-10-20 NOTE — Telephone Encounter (Signed)
-----   Message from Javier Glazier, MD sent at 10/19/2015  4:37 PM EDT ----- Regarding: RE:  Yes please do.  JnN ----- Message ----- From: Inge Rise, CMA Sent: 10/19/2015  12:19 PM To: Javier Glazier, MD  Yes I have scheduled this appt. Do I need to call the patient? ----- Message ----- From: Javier Glazier, MD Sent: 10/16/2015   9:04 PM To: Inge Rise, CMA  Can you schedule this patient for an appointment to see me on 9/8 at 9:30am. It looks like no one is booked in this slot. It's a full 30 min consult slot for lung nodules/dyspnea seen in the ED on 9/2.  Thanks.

## 2015-10-21 NOTE — Telephone Encounter (Signed)
Looks like pt was already confirmed by Jordan on 10/19/15. Will sign off message

## 2015-10-22 ENCOUNTER — Encounter: Payer: Self-pay | Admitting: Pulmonary Disease

## 2015-10-22 ENCOUNTER — Ambulatory Visit: Payer: Self-pay | Attending: Internal Medicine | Admitting: Physician Assistant

## 2015-10-22 ENCOUNTER — Other Ambulatory Visit (INDEPENDENT_AMBULATORY_CARE_PROVIDER_SITE_OTHER): Payer: Medicaid Other

## 2015-10-22 ENCOUNTER — Ambulatory Visit (INDEPENDENT_AMBULATORY_CARE_PROVIDER_SITE_OTHER): Payer: Self-pay | Admitting: Pulmonary Disease

## 2015-10-22 VITALS — BP 101/67 | HR 80 | Temp 98.2°F | Resp 20 | Ht 64.5 in | Wt 147.0 lb

## 2015-10-22 VITALS — BP 114/76 | HR 80 | Ht 64.5 in | Wt 145.4 lb

## 2015-10-22 DIAGNOSIS — R918 Other nonspecific abnormal finding of lung field: Secondary | ICD-10-CM

## 2015-10-22 DIAGNOSIS — R590 Localized enlarged lymph nodes: Secondary | ICD-10-CM

## 2015-10-22 DIAGNOSIS — J45909 Unspecified asthma, uncomplicated: Secondary | ICD-10-CM

## 2015-10-22 DIAGNOSIS — R599 Enlarged lymph nodes, unspecified: Secondary | ICD-10-CM

## 2015-10-22 DIAGNOSIS — Z23 Encounter for immunization: Secondary | ICD-10-CM

## 2015-10-22 DIAGNOSIS — Z201 Contact with and (suspected) exposure to tuberculosis: Secondary | ICD-10-CM

## 2015-10-22 DIAGNOSIS — I10 Essential (primary) hypertension: Secondary | ICD-10-CM

## 2015-10-22 DIAGNOSIS — R0600 Dyspnea, unspecified: Secondary | ICD-10-CM

## 2015-10-22 LAB — SEDIMENTATION RATE: Sed Rate: 28 mm/hr (ref 0–30)

## 2015-10-22 LAB — HEPATIC FUNCTION PANEL
ALBUMIN: 4.6 g/dL (ref 3.5–5.2)
ALK PHOS: 68 U/L (ref 39–117)
ALT: 13 U/L (ref 0–35)
AST: 15 U/L (ref 0–37)
Bilirubin, Direct: 0 mg/dL (ref 0.0–0.3)
Total Bilirubin: 0.3 mg/dL (ref 0.2–1.2)
Total Protein: 8.2 g/dL (ref 6.0–8.3)

## 2015-10-22 LAB — C-REACTIVE PROTEIN: CRP: 0.2 mg/dL — ABNORMAL LOW (ref 0.5–20.0)

## 2015-10-22 LAB — RHEUMATOID FACTOR: Rhuematoid fact SerPl-aCnc: <10 IU/mL (ref <=14)

## 2015-10-22 LAB — LACTATE DEHYDROGENASE: LDH: 138 U/L (ref 120–250)

## 2015-10-22 MED ORDER — HYDROCHLOROTHIAZIDE 12.5 MG PO TABS: 25.0000 mg | ORAL_TABLET | Freq: Every day | ORAL | 2 refills | Status: DC

## 2015-10-22 NOTE — Progress Notes (Signed)
C/o sob x 2 weeks. Worsens w/ exertion.  History of sinus issues, symptoms have increased the past 2 years.

## 2015-10-22 NOTE — Patient Instructions (Addendum)
   Call me if you have any questions.  My office will contact you with your results to plan out your bronchoscopy.   I will see you back in office in 4 weeks.  TESTS ORDERED: 1. Skin PPD Test - Patient to come back & do it this afternoon. 2. Serum Hepatic Function Panel, Quantiferon TB, ANCA Panel, ANA w/ Reflex to Comprehensive Panel, ESR, CRP, LDH, ACE, Rheumatoid Factor, SSA, SSB, & Anti-CCP. 3. Urine Histoplasma & Blastomyces Antigens 4. Full PFTs 5. 6MWT on room air

## 2015-10-22 NOTE — Progress Notes (Signed)
Patient ID: Sarah Cruz, female   DOB: 30-Nov-1965, 50 y.o.   MRN: [id number]   Sarah Cruz, is a 50 y.o. female  [id number]  [id number]  DOB - 1965-04-13  Subjective:  Chief Complaint and HPI: Sarah Cruz is a 50 y.o. female here today to establish care and for a follow up visit after being seen in the ED on 10/16/2015 for SOB and being diagnosed with nodules on her lungs and mediastinal LN.  She is a long time smoker who has had  Dyspnea over the last 2 years that became worse over the last few weeks and esp the last few days.  She has chronic sinus congestion.  She has had intermittent non-productive cough.  She denies hemoptysis, night sweats, fever, chills, weight loss.  She saw Dr Ashok Cordia this morning(pulmonology)-extensive labs were ordered and biopsy is being scheduled.  She was prescribed HCTZ for htn in the ED.  SOB has improved since ED visit.   From Pulmonology this morning: "50 y.o. female with mediastinal and hilar lymphadenopathy as well as parenchymal nodules and opacities. 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 The pattern is not typical for reactivation TB however. I reviewed her imaging with her extensively as well as the potential diagnoses that would account for this pattern including sarcoidosis, Wegener's granulomatosis, rheumatoid arthritis, lymphoma, etc. She has no symptoms of underlying infection that would account for this pattern but I believe that simple screening for histoplasmosis and Blastomyces is necessary. As an additional thought, some of her symptoms with regards to her dyspnea could be due to her ongoing tobacco use and possible underlying COPD/asthma. I did review the procedure of bronchoscopy with the patient today including the risks of bleeding, infection, pneumothorax, medication allergy, vocal cord injury, and potentially death. I believe  we will need to perform bronchoscopy with transbronchial biopsies as well as mediastinal lymph node biopsy. I instructed the patient to contact my office if she had any new breathing problems or questions before her next appointment.  1. Bilateral Lung Nodules:  Checking ANCA panel, ANA with reflex to comprehensive panel, ESR, CRP, rheumatoid factor, SSA, SSB, & anti-CCP. Checking urine Histoplasma and Blastomyces antigens. Plan for bronchoscopy with mediastinal lymph node biopsy and transbronchial biopsies as soon as lab tests result. 2. Mediastinal & Hilar Lymphadenopathy: Checking serum LDH & angiotensin-converting enzyme level. Patient will need mediastinal lymph node biopsy. 3. H/O Childhood Asthma: Checking full pulmonary function testing and 6 minute walk test on room air before next appointment. 4. TB Exposure: Checking QuantiFERON-TB as well as skin PPD test. 5. Follow-up: Patient to return to clinic in 4 weeks."  Reviewed notes, labs, pulmonology f/up, and CT.   ROS:   Constitutional:  No f/c, No night sweats, No unexplained weight loss. EENT:  No vision changes, No blurry vision, No hearing changes. No mouth, throat, or ear problems.  Respiratory: + Occasional non-productive cough, + SOB Cardiac: No CP, no palpitations GI:  No abd pain, No N/V/D. GU: No Urinary s/sx Musculoskeletal: No joint pain Neuro: No headache, no dizziness, no motor weakness.  Skin: No rash Endocrine:  No polydipsia. No polyuria.  Psych: Denies SI/HI  No problems updated.  ALLERGIES: No Known Allergies  PAST MEDICAL HISTORY: Past Medical History:  Diagnosis Date  . Childhood asthma   . Hypertension     MEDICATIONS AT HOME: Prior to Admission medications   Medication  Sig Start Date End Date Taking? Authorizing Provider  acetaminophen (TYLENOL) 500 MG tablet Take 1,000 mg by mouth 2 (two) times daily as needed for mild pain or headache.   Yes Historical Provider, MD  hydrochlorothiazide  (HYDRODIURIL) 12.5 MG tablet Take 2 tablets (25 mg total) by mouth daily. 10/22/15  Yes Dionne Bucy Makenzye Troutman, PA-C  loratadine (CLARITIN) 10 MG tablet Take 10 mg by mouth daily as needed (for sinus).    Historical Provider, MD     Objective:  EXAM:   Vitals:   10/22/15 1415  BP: 101/67  Pulse: 80  Resp: 20  Temp: 98.2 F (36.8 C)  TempSrc: Oral  SpO2: 99%  Weight: 147 lb (66.7 kg)  Height: 5' 4.5" (1.638 m)    General appearance : A&OX3. NAD. Non-toxic-appearing HEENT: Atraumatic and Normocephalic.  PERRLA. EOM intact.  TM clear B. Mouth-MMM, post pharynx WNL w/o erythema, No PND. Neck: supple, no JVD. No cervical lymphadenopathy. No thyromegaly Chest/Lungs:  Breathing-non-labored.  There is occasional wheezing with normal air movement.  No rhonchi/rales.  CVS: S1 S2 regular, no murmurs, gallops, rubs  Abdomen: Bowel sounds present, Non tender and not distended with no gaurding, rigidity or rebound. Extremities: Bilateral Lower Ext shows no edema, both legs are warm to touch with = pulse throughout Neurology:  CN II-XII grossly intact, Non focal.   Psych:  TP linear. J/I WNL. Normal speech. Appropriate eye contact and affect.  Skin:  No Rash   Assessment & Plan   1. Mediastinal adenopathy Being followed by pulmonology(see above from Pulmonology notes).  Biopsies being scheduled.   2. Essential hypertension Controlled.  Continue HCTZ 58m daily.  Will check BMP at f/up if a repeat one hasn't been done in other labs by then.    3. Multiple lung nodules on CT Being followed by pulmonology.  Biopsies being scheduled.   Patient have been counseled extensively about nutrition and exercise  Return in about 6 weeks (around 12/03/2015) for establish with PCP; f/up htn; check biopsy status.  The patient was given clear instructions to go to ER or return to medical center if symptoms don't improve, worsen or new problems develop. The patient verbalized understanding. The patient was  told to call to get lab results if they haven't heard anything in the next week.     AFreeman Caldron PA-C [facility] [place]  10/22/2015, 4:30 PM

## 2015-10-22 NOTE — Addendum Note (Signed)
Addended by: Inge Rise on: 10/22/2015 04:09 PM   Modules accepted: Orders

## 2015-10-22 NOTE — Progress Notes (Signed)
Subjective:    Patient ID: Sarah Cruz, female    DOB: 04-06-1965, 50 y.o.   MRN: [id number]  HPI She was seen in the ED on 9/2 and had an abnormal CT scan that prompted her referral to our office. She has noticed dyspnea with work & exertion that seems to have worsened slightly. She reports she had a cough with sinus drainage and congestion. Otherwise no significant cough. She has had intermittent wheezing. She denies any chest tightness or pain. No chest pressure. She reports she has had intermittent palpitations since she was a Cruz. No recent syncope or near syncope. She has had some hot & cold chills and well as sweats as she is going through menopause. She has had no weight gain/loss. She has lost significant weight in the past that she attributed to not eating while at work. No rashes or bruising. No adenopathy in her neck, groin, or axilla. No reflux, dyspepsia, odynophagia or dysphagia. No abdominal pain, nausea or emesis. No eye swelling, erythema, or blurry vision. She reports she has had intermittent headaches that have been relieved with Tylenol in her frontal region. She reports that as a Cruz she was "born with asthma". She reports she gets "bronchitis" at most once a year.   Review of Systems No dysuria or hematuria. No focal weakness, numbness or tingling. She did have some mild transient tingling in her hands bilaterally last week. She does have some stiffness in her hands that she attributes to her repetitive work in a cold environment but otherwise no joint swelling or erythema. A pertinent 14 point review of systems is negative except as per the history of presenting illness.  No Known Allergies  Current Outpatient Prescriptions on File Prior to Visit  Medication Sig Dispense Refill  . acetaminophen (TYLENOL) 500 MG tablet Take 1,000 mg by mouth 2 (two) times daily as needed for mild pain or headache.    . hydrochlorothiazide (HYDRODIURIL) 12.5 MG tablet Take 2 tablets (25  mg total) by mouth daily. 30 tablet 0  . loratadine (CLARITIN) 10 MG tablet Take 10 mg by mouth daily as needed (for sinus).     No current facility-administered medications on file prior to visit.     Past Medical History:  Diagnosis Date  . Childhood asthma   . Hypertension     Past Surgical History:  Procedure Laterality Date  . ABDOMINAL HYSTERECTOMY      Family History  Problem Relation Age of Onset  . Heart failure Mother   . Diabetes Mother   . Hypertension Mother   . Arthritis Mother   . Brain cancer Sister   . Sickle cell anemia Other   . Brain cancer Other   . Asthma Other   . Rheumatologic disease Neg Hx     Social History   Social History  . Marital status: Divorced    Spouse name: N/A  . Number of children: 3  . Years of education: N/A   Social History Main Topics  . Smoking status: Current Every Day Smoker    Packs/day: 0.25    Years: 38.00    Types: Cigarettes    Start date: 04/08/1977  . Smokeless tobacco: Never Used     Comment: Peak rate of 2ppd  . Alcohol use No     Comment: Remote EtOH  . Drug use: No  . Sexual activity: Not Asked   Other Topics Concern  . None   Social History Narrative   Originally  from [place], [place]. Moved to [facility] in November 2016. Prior travel to [place]. No travel outside the [place]. 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 No bird, mold, or hot tub exposure. No history of volunteer work or residence in a homeless shelter. No history of incarceration. Does have prior exposure to a nephew with TB but has always had a negative PPD skin test.       Objective:   Physical Exam BP 114/76 (BP Location: Left Arm, Patient Position: Sitting, Cuff Size: Normal)   Pulse 80   Ht 5' 4.5" (1.638 m)   Wt 145 lb 6.4 oz (66 kg)   SpO2 96%   BMI 24.57 kg/m  General:  Awake. Alert. No acute distress.   Integument:  Warm & dry. No rash on exposed skin. No bruising. Allergies noted on exposed skin. Lymphatics:   No appreciated cervical or supraclavicular lymphadenoapthy. HEENT:  Moist mucus membranes. No oral ulcers. No scleral injection or icterus.  Cardiovascular:  Regular rate. No edema. No appreciable JVD.  Pulmonary:  Good aeration & clear to auscultation bilaterally. Symmetric chest wall expansion. No accessory muscle use. Abdomen: Soft. Normal bowel sounds. Nondistended. Grossly nontender. Musculoskeletal:  Normal bulk and tone. Hand grip strength 5/5 bilaterally. No joint deformity or effusion appreciated. Neurological:  CN 2-12 grossly in tact. No meningismus. Moving all 4 extremities equally. Symmetric brachioradialis deep tendon reflexes. Psychiatric:  Mood and affect congruent. Speech normal rhythm, rate & tone.   IMAGING CT CHEST W/ 10/16/15 (personally reviewed by me): Bilateral parenchymal nodules and opacities. There is some mild cavitation in the nodules of the upper lobes suggested. No pleural effusion or thickening. Prominent axillary lymph nodes. Bilateral hilar lymphadenopathy measuring up to 1.8 cm as well as mediastinal adenopathy with subcarinal lymph node measuring approximately 2.2 cm in short axis. No pericardial effusion.  EKG 10/16/15 (reviewed by me): QTC 411 milliseconds. Normal sinus rhythm. No sign of conduction delay or suggestion of ischemia.  LABS 10/16/15 Troponin I: 0.01 CBC: 4.4/12.0/39.3/316 BMP: 137/4.3/104/25/11/0.78/92/9.5 D-dimer:  <0.27    Assessment & Plan:  50 y.o. female with mediastinal and hilar lymphadenopathy as well as parenchymal nodules and opacities. 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 The pattern is not typical for reactivation TB however. I reviewed her imaging with her extensively as well as the potential diagnoses that would account for this pattern including sarcoidosis, Wegener's granulomatosis, rheumatoid arthritis, lymphoma, etc. She  has no symptoms of underlying infection that would account for this pattern but I believe that simple screening for histoplasmosis and Blastomyces is necessary. As an additional thought, some of her symptoms with regards to her dyspnea could be due to her ongoing tobacco use and possible underlying COPD/asthma. I did review the procedure of bronchoscopy with the patient today including the risks of bleeding, infection, pneumothorax, medication allergy, vocal cord injury, and potentially death. I believe we will need to perform bronchoscopy with transbronchial biopsies as well as mediastinal lymph node biopsy. I instructed the patient to contact my office if she had any new breathing problems or questions before her next appointment.  1. Bilateral Lung Nodules:  Checking ANCA panel, ANA with reflex to comprehensive panel, ESR, CRP, rheumatoid factor, SSA, SSB, & anti-CCP. Checking urine Histoplasma and Blastomyces antigens. Plan for bronchoscopy with mediastinal lymph node biopsy and transbronchial biopsies as soon as lab tests result. 2. Mediastinal &  Hilar Lymphadenopathy: Checking serum LDH & angiotensin-converting enzyme level. Patient will need mediastinal lymph node biopsy. 3. H/O Childhood Asthma: Checking full pulmonary function testing and 6 minute walk test on room air before next appointment. 4. TB Exposure: Checking QuantiFERON-TB as well as skin PPD test. 5. Follow-up: Patient to return to clinic in 4 weeks.  Sonia Baller Ashok Cordia, M.D. [facility] Pager:  [phone] After 3pm or if no response, call [phone] 10:27 AM 10/22/15

## 2015-10-23 LAB — ANA COMPREHENSIVE PANEL
Chromatin Ab SerPl-aCnc: <0.2 AI (ref 0.0–0.9)
DSDNA AB: 1 [IU]/mL (ref 0–9)
ENA RNP AB: 0.3 AI (ref 0.0–0.9)
ENA SSA (RO) Ab: <0.2 AI (ref 0.0–0.9)
ENA SSB (LA) Ab: 0.4 AI (ref 0.0–0.9)
Scleroderma SCL-70: <0.2 AI (ref 0.0–0.9)

## 2015-10-23 LAB — ANA W/REFLEX: ANA: NEGATIVE

## 2015-10-25 LAB — SJOGREN'S SYNDROME ANTIBODS(SSA + SSB)
SSA (RO) (ENA) ANTIBODY, IGG: NEGATIVE
SSB (La) (ENA) Antibody, IgG: <1

## 2015-10-25 LAB — HISTOPLASMA ANTIGEN, URINE

## 2015-10-25 LAB — CYCLIC CITRUL PEPTIDE ANTIBODY, IGG

## 2015-10-25 LAB — TB SKIN TEST
Induration: 0 mm
TB Skin Test: NEGATIVE

## 2015-10-25 LAB — ANCA SCREEN W REFLEX TITER: ANCA SCREEN: NEGATIVE

## 2015-10-26 LAB — ANGIOTENSIN CONVERTING ENZYME: ANGIOTENSIN-CONVERTING ENZYME: 63 U/L (ref 9–67)

## 2015-11-01 LAB — MVISTA BLASTOMYCES QNT AG, URINE

## 2015-11-12 ENCOUNTER — Ambulatory Visit (HOSPITAL_COMMUNITY)
Admission: RE | Admit: 2015-11-12 | Discharge: 2015-11-12 | Disposition: A | Discharge status: Home / Self Care | Payer: Self-pay | Source: Ambulatory Visit | Attending: Pulmonary Disease | Admitting: Pulmonary Disease

## 2015-11-12 DIAGNOSIS — J449 Chronic obstructive pulmonary disease, unspecified: Secondary | ICD-10-CM | POA: Insufficient documentation

## 2015-11-12 DIAGNOSIS — R0600 Dyspnea, unspecified: Secondary | ICD-10-CM | POA: Insufficient documentation

## 2015-11-12 DIAGNOSIS — R918 Other nonspecific abnormal finding of lung field: Secondary | ICD-10-CM | POA: Insufficient documentation

## 2015-11-12 LAB — PULMONARY FUNCTION TEST
DL/VA % pred: 89 %
DL/VA: 4.36 ml/min/mmHg/L
DLCO UNC % PRED: 58 %
DLCO unc: 14.47 ml/min/mmHg
FEF 25-75 PRE: 0.54 L/s
FEF 25-75 Post: 0.88 L/sec
FEF2575-%CHANGE-POST: 63 %
FEF2575-%Pred-Post: 35 %
FEF2575-%Pred-Pre: 21 %
FEV1-%Change-Post: 18 %
FEV1-%PRED-POST: 52 %
FEV1-%PRED-PRE: 44 %
FEV1-POST: 1.25 L
FEV1-PRE: 1.05 L
FEV1FVC-%CHANGE-POST: 4 %
FEV1FVC-%Pred-Pre: 71 %
FEV6-%CHANGE-POST: 12 %
FEV6-%PRED-POST: 70 %
FEV6-%Pred-Pre: 62 %
FEV6-PRE: 1.8 L
FEV6-Post: 2.02 L
FEV6FVC-%Change-Post: 0 %
FEV6FVC-%PRED-PRE: 102 %
FEV6FVC-%Pred-Post: 101 %
FVC-%CHANGE-POST: 13 %
FVC-%Pred-Post: 69 %
FVC-%Pred-Pre: 61 %
FVC-Post: 2.04 L
FVC-Pre: 1.81 L
POST FEV1/FVC RATIO: 61 %
PRE FEV1/FVC RATIO: 58 %
Post FEV6/FVC ratio: 99 %
Pre FEV6/FVC Ratio: 99 %
RV % PRED: 187 %
RV: 3.42 L
TLC % pred: 103 %
TLC: 5.29 L

## 2015-11-12 MED ORDER — ALBUTEROL SULFATE (2.5 MG/3ML) 0.083% IN NEBU
2.5000 mg | INHALATION_SOLUTION | Freq: Once | RESPIRATORY_TRACT | Status: AC
  Administered 2015-11-12: 2.5 mg via RESPIRATORY_TRACT

## 2015-11-19 ENCOUNTER — Other Ambulatory Visit: Payer: Self-pay

## 2015-11-19 ENCOUNTER — Encounter: Payer: Self-pay | Admitting: Pulmonary Disease

## 2015-11-19 ENCOUNTER — Ambulatory Visit (INDEPENDENT_AMBULATORY_CARE_PROVIDER_SITE_OTHER): Payer: Self-pay | Admitting: Pulmonary Disease

## 2015-11-19 ENCOUNTER — Telehealth: Payer: Self-pay | Admitting: Pulmonary Disease

## 2015-11-19 VITALS — BP 120/66 | HR 68 | Ht 64.5 in | Wt 146.4 lb

## 2015-11-19 DIAGNOSIS — R918 Other nonspecific abnormal finding of lung field: Secondary | ICD-10-CM

## 2015-11-19 DIAGNOSIS — R590 Localized enlarged lymph nodes: Secondary | ICD-10-CM

## 2015-11-19 DIAGNOSIS — Z201 Contact with and (suspected) exposure to tuberculosis: Secondary | ICD-10-CM

## 2015-11-19 DIAGNOSIS — F17200 Nicotine dependence, unspecified, uncomplicated: Secondary | ICD-10-CM

## 2015-11-19 DIAGNOSIS — J449 Chronic obstructive pulmonary disease, unspecified: Secondary | ICD-10-CM

## 2015-11-19 MED ORDER — BUDESONIDE-FORMOTEROL FUMARATE 160-4.5 MCG/ACT IN AERO: 2.0000 | INHALATION_SPRAY | Freq: Two times a day (BID) | RESPIRATORY_TRACT | 0 refills | Status: DC

## 2015-11-19 NOTE — Progress Notes (Signed)
 Subjective:    Patient ID: Sarah Cruz, female    DOB: 10/29/1965, 50 y.o.   MRN: [id number]  C.C.:  Follow-up for Severe COPD,  & Tobacco Use Disorder.  HPI Severe COPD:  Patient found to have severe airway obstruction on her pulmonary function testing last month with significant bronchodilator response. Known history of childhood asthma. She reports she did have coughing & wheezing yesterday with inhaled irritant exposure. She reports she does continue to wheeze intermittently.   Bilateral Lung Nodules:  Possibly secondary to Sarcoidosis with elevated ACE level. Autoimmune workup negative otherwise.  Mediastinal/Hilar Lymphadenopathy:  ACE level elevated. No adenopathy in her neck, groin, or axilla.  H/O TB Exposure: Skin PPD negative & QuantiFERON TB pending.  Tobacco Use Disorder:  She has been trying to cut back. She smokes anywhere form 4 cigarettes daily to 1ppd. Hasn't tried pills or nicotine replacement.  Review of Systems No fever, chills, or sweats. No chest pain, pressure, or tightness. No rashes or bruising.  No Known Allergies  Current Outpatient Prescriptions on File Prior to Visit  Medication Sig Dispense Refill  . acetaminophen (TYLENOL) 500 MG tablet Take 1,000 mg by mouth 2 (two) times daily as needed for mild pain or headache.    . hydrochlorothiazide (HYDRODIURIL) 12.5 MG tablet Take 2 tablets (25 mg total) by mouth daily. 60 tablet 2  . loratadine (CLARITIN) 10 MG tablet Take 10 mg by mouth daily as needed (for sinus).     No current facility-administered medications on file prior to visit.     Past Medical History:  Diagnosis Date  . Childhood asthma   . Hypertension     Past Surgical History:  Procedure Laterality Date  . ABDOMINAL HYSTERECTOMY      Family History  Problem Relation Age of Onset  . Heart failure Mother   . Diabetes Mother   . Hypertension Mother   . Arthritis Mother   . Brain cancer Sister   . Sickle cell anemia Other   .  Brain cancer Other   . Asthma Other   . Rheumatologic disease Neg Hx     Social History   Social History  . Marital status: Divorced    Spouse name: N/A  . Number of children: 3  . Years of education: N/A   Social History Main Topics  . Smoking status: Current Every Day Smoker    Packs/day: 2.00    Years: 38.00    Types: Cigarettes    Start date: 04/08/1977  . Smokeless tobacco: Never Used     Comment: down to 0.5ppd  . Alcohol use No     Comment: Remote EtOH  . Drug use: No  . Sexual activity: Not Asked   Other Topics Concern  . None   Social History Narrative   Originally from [place], [place]. Moved to [place] in November 2016. Prior travel to [place]. No travel outside the [place]. Previously used to do warehouse and factory work. No pets currently. No bird, mold, or hot tub exposure. No history of volunteer work or residence in a homeless shelter. No history of incarceration. Does have prior exposure to a nephew with TB but has always had a negative PPD skin test.       Objective:   Physical Exam BP 120/66 (BP Location: Left Arm, Cuff Size: Normal)   Pulse 68   Ht 5' 4.5" (1.638 m)   Wt 146 lb 6.4 oz (66.4 kg)   SpO2 97%   BMI   24.74 kg/m  General:  Awake. Alert. No acute distress. Thin, African-American Female. Integument:  Warm & dry. No rash on exposed skin. No bruising. Tattoos noted on exposed skin. Lymphatics:  No appreciated cervical or supraclavicular lymphadenoapthy. HEENT:  Moist mucus membranes. No oral ulcers. No scleral icterus. No scleral injection. Cardiovascular:  Regular rate. No edema. Normal S1 & S2.  Pulmonary:  Clear on auscultation. Good aeration bilaterally. Clear on auscultation. Abdomen: Soft. Normal bowel sounds. Nontender. Musculoskeletal:  Normal bulk and tone. No joint deformity or effusion appreciated.  PFT 11/15/15: FVC 1.81 L (61%) FEV1 1.05 L (44%) FEV1/FVC 0.58 FEF 25-75 0.54 L (21%) positive bronchodilator response TLC 5.29 L (103%) RV 187% ERV  75% DLC uncorrected 58%  6MWT 11/19/15:  Walked 444 meters / Baseline Sat 99% on RA / Nadir Sat 98% on RA @ end of test  IMAGING CT CHEST W/ 10/16/15 (previously reviewed by me): Bilateral parenchymal nodules and opacities. There is some mild cavitation in the nodules of the upper lobes suggested. No pleural effusion or thickening. Prominent axillary lymph nodes. Bilateral hilar lymphadenopathy measuring up to 1.8 cm as well as mediastinal adenopathy with subcarinal lymph node measuring approximately 2.2 cm in short axis. No pericardial effusion.  EKG 10/16/15 (previously reviewed by me): QTC 411 milliseconds. Normal sinus rhythm. No sign of conduction delay or suggestion of ischemia.  MICROBIOLOGY Skin PPD (10/25/15):  Negative Urine Histoplasma Ag (10/22/15):  Negative Urine Blastomyces Ag (10/22/15):  Negative   LABS 10/22/15 LFT: 4.6/8.2/0.3/68/15/13 CRP: 0.2 ESR: 28 LDH: 138 ACE:  63 ANCA Screen:  Negative  ANA:  Negative Anti-Jo1:  <0.2 Centrometere Ab Screen:  <0.2 DS DNA Ab:  1 RNP Ab:  0.3 Smith Ab:  <0.2 Chromatin Ab:  <0.2 RF:  <10 Anti-CCP:  <16 SSA:  <0.2 SSB:  0.4 SCL-70:  <0.2  10/16/15 Troponin I: 0.01 CBC: 4.4/12.0/39.3/316 BMP: 137/4.3/104/25/11/0.78/92/9.5 D-dimer:  <0.27    Assessment & Plan:  50 y.o. female with mediastinal and hilar lymphadenopathy as well as parenchymal nodules and opacities. These findings are most consistent with sarcoidosis given her elevated angiotensin converting enzyme level and African-American racial elasticity. However, biopsy will be needed to obtain enough information to help confirm the diagnosis as it only appears as though her lungs are involved. Certainly lymphoma is of a concern but with a normal LDH and no other symptoms to suggest an underlying malignancy. With her negative autoimmune workup I feel the next logical step is to perform transbronchial biopsies and mediastinal lymph node sampling before starting systemic steroid  therapy. However, with her significant bronchodilator response and severe underlying COPD I feel that initiating inhaled bronchodilator therapy at this time is reasonable. I did spend over 3 minutes counseling the patient on the need for complete tobacco cessation to prevent worsening of her underlying lung function. She is going to try nicotine replacement. We discussed the risks of bronchoscopy including medication allergy, vocal cord injury, bleeding, infection, pneumothorax, and potentially death. The patient is willing to undergo the procedure.  1. Severe COPD: Starting patient on Symbicort 160/4.5. Consider screening for alpha-1 antitrypsin deficiency at next appointment if patient is able to quit smoking. 2. 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 Plan for bronchoscopy with lavage and transbronchial biopsies with fluoroscopy on 10/24. 3. Mediastinal & Hilar Lymphadenopathy: Likely underlying sarcoidosis. Plan for bronchoscopy with airway inspection, transbronchial biopsies, and mediastinal lymph node biopsy with endobronchial ultrasound on 10/24. 4. Tobacco Use Disorder: Patient counseled for over 3 minutes of   need for complete tobacco cessation. Recommended using nicotine patches daily and gum for intermittent cravings. 5. TB Exposure: Quantiferon Gold obtained today. 6. Health Maintenance: Patient declines immunizations. 7. Follow-up: Patient to return to clinic in  4 weeks or sooner if needed.   Adeyemi Hamad E. Deaken Jurgens, M.D. [facility] Pager:  [phone] After 3pm or if no response, call [phone] 5:55 PM 11/19/15      

## 2015-11-19 NOTE — Patient Instructions (Signed)
   Use your Symbicort inhaler daily - inhale 2 puffs twice daily.  Remember to brush your teeth, brush your tongue, rinse, gargle, & spit after you use the inhaler to keep from getting thrush.  Try using low dose nicotine patches along with nicotine gum for intermittent cravings.  Call me if you have any questions or concerns.  We will attempt to schedule your procedure on 10/24 in the afternoon at [facility] across the street from my office.  Remember you cannot eat or drink anything for at least 6 hours prior to your procedure.  I will see you back in 4 weeks.

## 2015-11-19 NOTE — Progress Notes (Signed)
Test reviewed.  

## 2015-11-19 NOTE — Telephone Encounter (Signed)
PFT 11/15/15: FVC 1.81 L (61%) FEV1 1.05 L (44%) FEV1/FVC 0.58 FEF 25-75 0.54 L (21%) positive bronchodilator response TLC 5.29 L (103%) RV 187% ERV 75% DLC uncorrected 58%  MICROBIOLOGY Skin PPD (10/25/15):  Negative Urine Histoplasma Ag (10/22/15):  Negative Urine Blastomyces Ag (10/22/15):  Negative   LABS 10/22/15 LFT: 4.6/8.2/0.3/68/15/13 CRP: 0.2 ESR: 28 LDH: 138 ACE:  63 ANA:  Negative Anti-Jo1:  <0.2 Centrometere Ab Screen:  <0.2 DS DNA Ab:  1 RNP Ab:  0.3 Smith Ab:  <0.2 Chromatin Ab:  <0.2 RF:  <10 Anti-CCP:  <16 SSA:  <0.2 SSB:  0.4 SCL-70:  <0.2

## 2015-11-22 LAB — QUANTIFERON TB GOLD ASSAY (BLOOD)
INTERFERON GAMMA RELEASE ASSAY: NEGATIVE
Mitogen-Nil: >10 IU/mL
QUANTIFERON NIL VALUE: 0.03 [IU]/mL
QUANTIFERON TB AG MINUS NIL: 0 [IU]/mL

## 2015-12-06 ENCOUNTER — Encounter (HOSPITAL_COMMUNITY): Payer: Self-pay | Admitting: *Deleted

## 2015-12-06 ENCOUNTER — Telehealth: Payer: Self-pay | Admitting: Pulmonary Disease

## 2015-12-06 NOTE — Telephone Encounter (Signed)
Called and spoke to [place] at [place]. Rise Paganini states she has been trying to reach the pt and has had to leave VM, pt has an EBUS scheduled for 10/24. Called and spoke to pt. Pt states she was going to call today after work, gave the pt Beverly's contact information and informed her to call before 5 today. Pt verbalized understanding and denied any further questions or concerns at this time.   Called and left detailed VM for [facility] that pt is to call back today before 5 and to call us if there are any issues. Nothing further needed at this time. Will sign off.

## 2015-12-07 ENCOUNTER — Ambulatory Visit (HOSPITAL_COMMUNITY)
Admission: RE | Admit: 2015-12-07 | Discharge: 2015-12-07 | Disposition: A | Discharge status: Home / Self Care | Payer: Self-pay | Source: Ambulatory Visit | Attending: Pulmonary Disease | Admitting: Pulmonary Disease

## 2015-12-07 ENCOUNTER — Encounter (HOSPITAL_COMMUNITY): Payer: Self-pay | Admitting: Anesthesiology

## 2015-12-07 ENCOUNTER — Ambulatory Visit (HOSPITAL_COMMUNITY): Payer: Self-pay | Admitting: Anesthesiology

## 2015-12-07 ENCOUNTER — Ambulatory Visit (HOSPITAL_COMMUNITY): Payer: Self-pay

## 2015-12-07 ENCOUNTER — Encounter (HOSPITAL_COMMUNITY)
Admission: RE | Disposition: A | Discharge status: Home / Self Care | Payer: Self-pay | Source: Ambulatory Visit | Attending: Pulmonary Disease

## 2015-12-07 DIAGNOSIS — R590 Localized enlarged lymph nodes: Secondary | ICD-10-CM | POA: Insufficient documentation

## 2015-12-07 DIAGNOSIS — I10 Essential (primary) hypertension: Secondary | ICD-10-CM | POA: Insufficient documentation

## 2015-12-07 DIAGNOSIS — J449 Chronic obstructive pulmonary disease, unspecified: Secondary | ICD-10-CM | POA: Insufficient documentation

## 2015-12-07 DIAGNOSIS — Z833 Family history of diabetes mellitus: Secondary | ICD-10-CM | POA: Insufficient documentation

## 2015-12-07 DIAGNOSIS — Z8249 Family history of ischemic heart disease and other diseases of the circulatory system: Secondary | ICD-10-CM | POA: Insufficient documentation

## 2015-12-07 DIAGNOSIS — R918 Other nonspecific abnormal finding of lung field: Secondary | ICD-10-CM

## 2015-12-07 DIAGNOSIS — Z808 Family history of malignant neoplasm of other organs or systems: Secondary | ICD-10-CM | POA: Insufficient documentation

## 2015-12-07 DIAGNOSIS — F17210 Nicotine dependence, cigarettes, uncomplicated: Secondary | ICD-10-CM | POA: Insufficient documentation

## 2015-12-07 DIAGNOSIS — Z90710 Acquired absence of both cervix and uterus: Secondary | ICD-10-CM | POA: Insufficient documentation

## 2015-12-07 DIAGNOSIS — Z201 Contact with and (suspected) exposure to tuberculosis: Secondary | ICD-10-CM | POA: Insufficient documentation

## 2015-12-07 HISTORY — PX: ENDOBRONCHIAL ULTRASOUND: SHX5096

## 2015-12-07 SURGERY — ENDOBRONCHIAL ULTRASOUND (EBUS)
Anesthesia: General | Laterality: Bilateral

## 2015-12-07 MED ORDER — PROPOFOL 10 MG/ML IV BOLUS
INTRAVENOUS | Status: AC
  Filled 2015-12-07: qty 20

## 2015-12-07 MED ORDER — MIDAZOLAM HCL 2 MG/2ML IJ SOLN
INTRAMUSCULAR | Status: AC
  Filled 2015-12-07: qty 2

## 2015-12-07 MED ORDER — PROPOFOL 500 MG/50ML IV EMUL
INTRAVENOUS | Status: DC | PRN
  Administered 2015-12-07: 150 ug/kg/min via INTRAVENOUS

## 2015-12-07 MED ORDER — LACTATED RINGERS IV SOLN
INTRAVENOUS | Status: DC | PRN
  Administered 2015-12-07: 12:00:00 via INTRAVENOUS

## 2015-12-07 MED ORDER — LACTATED RINGERS IV SOLN: INTRAVENOUS | Status: DC

## 2015-12-07 MED ORDER — PROPOFOL 10 MG/ML IV BOLUS
INTRAVENOUS | Status: DC | PRN
  Administered 2015-12-07 (×3): 20 mg via INTRAVENOUS

## 2015-12-07 MED ORDER — FENTANYL CITRATE (PF) 100 MCG/2ML IJ SOLN
INTRAMUSCULAR | Status: AC
  Filled 2015-12-07: qty 2

## 2015-12-07 MED ORDER — FENTANYL CITRATE (PF) 100 MCG/2ML IJ SOLN
INTRAMUSCULAR | Status: DC | PRN
  Administered 2015-12-07: 50 ug via INTRAVENOUS

## 2015-12-07 MED ORDER — LIDOCAINE 2% (20 MG/ML) 5 ML SYRINGE
INTRAMUSCULAR | Status: DC | PRN
  Administered 2015-12-07: 20 mg via INTRAVENOUS

## 2015-12-07 MED ORDER — PROPOFOL 10 MG/ML IV BOLUS
INTRAVENOUS | Status: AC
  Filled 2015-12-07: qty 40

## 2015-12-07 MED ORDER — DEXAMETHASONE SODIUM PHOSPHATE 10 MG/ML IJ SOLN
INTRAMUSCULAR | Status: AC
  Filled 2015-12-07: qty 1

## 2015-12-07 MED ORDER — ONDANSETRON HCL 4 MG/2ML IJ SOLN
INTRAMUSCULAR | Status: AC
  Filled 2015-12-07: qty 2

## 2015-12-07 MED ORDER — ONDANSETRON HCL 4 MG/2ML IJ SOLN
INTRAMUSCULAR | Status: DC | PRN
  Administered 2015-12-07: 4 mg via INTRAVENOUS

## 2015-12-07 MED ORDER — MIDAZOLAM HCL 5 MG/5ML IJ SOLN
INTRAMUSCULAR | Status: DC | PRN
  Administered 2015-12-07 (×2): 1 mg via INTRAVENOUS

## 2015-12-07 NOTE — Transfer of Care (Signed)
Immediate Anesthesia Transfer of Care Note  Patient: Sarah Cruz  Procedure(s) Performed: Procedure(s): ENDOBRONCHIAL ULTRASOUND (Bilateral)  Patient Location: PACU Endo  Anesthesia Type:MAC  Level of Consciousness: awake and alert   Airway & Oxygen Therapy: Patient Spontanous Breathing  Post-op Assessment: Report given to RN and Post -op Vital signs reviewed and stable  Post vital signs: Reviewed and stable  Last Vitals:  Vitals:   12/07/15 1233  BP: 126/77  Pulse: (!) 59  Resp: 12  Temp: 36.9 C    Last Pain:  Vitals:   12/07/15 1233  TempSrc: Oral         Complications: No apparent anesthesia complications

## 2015-12-07 NOTE — Op Note (Signed)
Video Bronchoscopy Procedure Note  Pre-Procedure Diagnoses: 1.  Mediastinal Lymphadenopathy 2.  Multiple Lung Nodules on CT Scan  Procedures Performed: 1. Bronchoscopy with Airway Inspection 2.  Transbronchial Biopsy with Fluroscopy 3. Endobronchial Ultrasound with Needle Aspiration  Consent:  Informed consent was obtained from the patient after discussing the risks and benefits of the procedure including bleeding, infection, pneumothorax, medication allergy, vocal chord injury, and potentially death.  Conscious Sedation:  Refer to Anesthesia Documentation.   Medications Administered During Conscious Sedation:  Refer to Anesthesia Documentation.   Pre-Procedure Physical Exam: General:  No acute distress. Awake. Alert. ASA Class 1. HEENT:  Moist mucus membranes. No oral ulcers. Mallampati Class 3. Cardiovascular:  Regular rate. No edema. No appreciable JVD. Pulmonary:  Clear to auscultation with good aeration bilaterally. Normal work of breathing on room air. Abdomen:  Soft. Nontender. Nondistended. Normal bowel sounds. Musculoskeletal:  Normal bulk and tone. Normal neck flexion & extension. Neurological:  Oriented to person, place, and time. Moving all 4 extremities equally.  Description of Procedure: Patient was brought back to the endoscopy procedure room.  A time out was performed to identify the correct patient and procedure.  Patient was laid recumbent and conscious sedation was administered by anesthesia.  Bite block was inserted and towel was placed over the patient's eyes.  Benzocaine spray was used to anesthetize the patient's retropharynx. Flexible bronchoscope was then inserted into the posterior pharynx until vocal chords were in full view. There was no abnormality of the vocal chords or arytenoids.  A total of 6cc of Lidocaine was used to anesthetize the vocal chords.  The bronchoscope was then inserted between the vocal chords with ease into the proximal trachea.  Lidocaine  was then used to anesthetize the patient's proximal airways.  An airway inspeciton was performed finding copious thin, clear secretions bilaterally. There were some false diverticuli as well some mild airway dilation. The mucosa overall was normal. There was extreme sensitivity. The bronchoscope was then advanced into the right lower lobe. Transbronchial biopsies were performed under fluroscopy in the right lower lobe.  No immediate pneumothorax was identified with fluroscopy.  The flexible bronchoscope was then removed from the patient's airways after suctioning of the remaining secretions.  The endobronchial ultrasound scope was then inserted into the posterior pharynx and between the vocal chords with ease.  An enlarged lymph node at level 4R was identified with endobronchial ultrasound but due to proximity to vessels no biopsy was performed.  The endobronchial ultrasound scope was then repositioned to the carina and a significantly enlarged lymph node was visualized. Under direct visualization with the ultrasound a total of 5 passes were performed at level 7.  Hemostasis was directly visualized.  The remaining secretions were suctioned from the patient's airways and the endobronchial ultrasound scope was removed.  The bite block was removed and the patient was returned to the upright position.  Blood Loss:  Less than 10 cc.  Complications:  None.  Transbronchial Biopsy: 1. Performed on the Right Lower Lobe:  Sent for pathology and flow cytometry  EBUS Procedure: Level 4R:  1cm lymph node / no biopsy performed  Level 7:  3 cm lymph node / biopsy performed / ROSE / passes 1 - 5 for cytology, cell block, & flow cytometry  Post Procedure Stat Portable CXR:  Ordered and pending.  Post Procedure Instructions: Personally spoke with the patient's daughter relaying the preliminary results of the procedure.  Instructed her that the patient is not to operate a car  for 24 hours.  The patient should seek  immediate medical attention if there is any persistent or progressive hemoptysis, difficulty breathing, or chest pain/discomfort.  The patient should also notify me immediately or seek medical attention for any purulent sputum production or fever occuring today or in the coming days.  The patient will be contacted by me once the final results of the studies are available.  The patient should call my office if they have any questions.

## 2015-12-07 NOTE — Progress Notes (Signed)
Video Bronchoscopy done before Ebus procedure  Intervention bronchial biopsy done

## 2015-12-07 NOTE — H&P (View-Only) (Signed)
Subjective:    Patient ID: Sarah Cruz, female    DOB: 19-Oct-1965, 50 y.o.   MRN: [id number]  C.C.:  Follow-up for Severe COPD,  & Tobacco Use Disorder.  HPI Severe COPD:  Patient found to have severe airway obstruction on her pulmonary function testing last month with significant bronchodilator response. Known history of childhood asthma. She reports she did have coughing & wheezing yesterday with inhaled irritant exposure. She reports she does continue to wheeze intermittently.   Bilateral Lung Nodules:  Possibly secondary to Sarcoidosis with elevated ACE level. Autoimmune workup negative otherwise.  Mediastinal/Hilar Lymphadenopathy:  ACE level elevated. No adenopathy in her neck, groin, or axilla.  H/O TB Exposure: Skin PPD negative & QuantiFERON TB pending.  Tobacco Use Disorder:  She has been trying to cut back. She smokes anywhere form 4 cigarettes daily to 1ppd. Hasn't tried pills or nicotine replacement.  Review of Systems No fever, chills, or sweats. No chest pain, pressure, or tightness. No rashes or bruising.  No Known Allergies  Current Outpatient Prescriptions on File Prior to Visit  Medication Sig Dispense Refill  . acetaminophen (TYLENOL) 500 MG tablet Take 1,000 mg by mouth 2 (two) times daily as needed for mild pain or headache.    . hydrochlorothiazide (HYDRODIURIL) 12.5 MG tablet Take 2 tablets (25 mg total) by mouth daily. 60 tablet 2  . loratadine (CLARITIN) 10 MG tablet Take 10 mg by mouth daily as needed (for sinus).     No current facility-administered medications on file prior to visit.     Past Medical History:  Diagnosis Date  . Childhood asthma   . Hypertension     Past Surgical History:  Procedure Laterality Date  . ABDOMINAL HYSTERECTOMY      Family History  Problem Relation Age of Onset  . Heart failure Mother   . Diabetes Mother   . Hypertension Mother   . Arthritis Mother   . Brain cancer Sister   . Sickle cell anemia Other   .  Brain cancer Other   . Asthma Other   . Rheumatologic disease Neg Hx     Social History   Social History  . Marital status: Divorced    Spouse name: N/A  . Number of children: 3  . Years of education: N/A   Social History Main Topics  . Smoking status: Current Every Day Smoker    Packs/day: 2.00    Years: 38.00    Types: Cigarettes    Start date: 04/08/1977  . Smokeless tobacco: Never Used     Comment: down to 0.5ppd  . Alcohol use No     Comment: Remote EtOH  . Drug use: No  . Sexual activity: Not Asked   Other Topics Concern  . None   Social History Narrative   Originally from [place], [place]. Moved to [facility] in November 2016. Prior travel to [place]. No travel outside the [place]. 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 No bird, mold, or hot tub exposure. No history of volunteer work or residence in a homeless shelter. No history of incarceration. Does have prior exposure to a nephew with TB but has always had a negative PPD skin test.       Objective:   Physical Exam BP 120/66 (BP Location: Left Arm, Cuff Size: Normal)   Pulse 68   Ht 5' 4.5" (1.638 m)   Wt 146 lb 6.4 oz (66.4 kg)   SpO2 97%   BMI  24.74 kg/m  General:  Awake. Alert. No acute distress. Thin, African-American Female. Integument:  Warm & dry. No rash on exposed skin. No bruising. Tattoos noted on exposed skin. Lymphatics:  No appreciated cervical or supraclavicular lymphadenoapthy. HEENT:  Moist mucus membranes. No oral ulcers. No scleral icterus. No scleral injection. Cardiovascular:  Regular rate. No edema. Normal S1 & S2.  Pulmonary:  Clear on auscultation. Good aeration bilaterally. Clear on auscultation. Abdomen: Soft. Normal bowel sounds. Nontender. Musculoskeletal:  Normal bulk and tone. No joint deformity or effusion appreciated.  PFT 11/15/15: FVC 1.81 L (61%) FEV1 1.05 L (44%) FEV1/FVC 0.58 FEF 25-75 0.54 L (21%) positive bronchodilator response TLC 5.29 L (103%) RV 187% ERV  75% DLC uncorrected 58%  6MWT 11/19/15:  Walked 444 meters / Baseline Sat 99% on RA / Nadir Sat 98% on RA @ end of test  IMAGING CT CHEST W/ 10/16/15 (previously reviewed by me): Bilateral parenchymal nodules and opacities. There is some mild cavitation in the nodules of the upper lobes suggested. No pleural effusion or thickening. Prominent axillary lymph nodes. Bilateral hilar lymphadenopathy measuring up to 1.8 cm as well as mediastinal adenopathy with subcarinal lymph node measuring approximately 2.2 cm in short axis. No pericardial effusion.  EKG 10/16/15 (previously reviewed by me): QTC 411 milliseconds. Normal sinus rhythm. No sign of conduction delay or suggestion of ischemia.  MICROBIOLOGY Skin PPD (10/25/15):  Negative Urine Histoplasma Ag (10/22/15):  Negative Urine Blastomyces Ag (10/22/15):  Negative   LABS 10/22/15 LFT: 4.6/8.2/0.3/68/15/13 CRP: 0.2 ESR: 28 LDH: 138 ACE:  63 ANCA Screen:  Negative  ANA:  Negative Anti-Jo1:  <0.2 Centrometere Ab Screen:  <0.2 DS DNA Ab:  1 RNP Ab:  0.3 Smith Ab:  <0.2 Chromatin Ab:  <0.2 RF:  <10 Anti-CCP:  <16 SSA:  <0.2 SSB:  0.4 SCL-70:  <0.2  10/16/15 Troponin I: 0.01 CBC: 4.4/12.0/39.3/316 BMP: 137/4.3/104/25/11/0.78/92/9.5 D-dimer:  <0.27    Assessment & Plan:  50 y.o. female with mediastinal and hilar lymphadenopathy as well as parenchymal nodules and opacities. These findings are most consistent with sarcoidosis given her elevated angiotensin converting enzyme level and African-American racial elasticity. However, biopsy will be needed to obtain enough information to help confirm the diagnosis as it only appears as though her lungs are involved. Certainly lymphoma is of a concern but with a normal LDH and no other symptoms to suggest an underlying malignancy. With her negative autoimmune workup I feel the next logical step is to perform transbronchial biopsies and mediastinal lymph node sampling before starting systemic steroid  therapy. However, with her significant bronchodilator response and severe underlying COPD I feel that initiating inhaled bronchodilator therapy at this time is reasonable. I did spend over 3 minutes counseling the patient on the need for complete tobacco cessation to prevent worsening of her underlying lung function. She is going to try nicotine replacement. We discussed the risks of bronchoscopy including medication allergy, vocal cord injury, bleeding, infection, pneumothorax, and potentially death. The patient is willing to undergo the procedure.  1. Severe COPD: Starting patient on Symbicort 160/4.5. Consider screening for alpha-1 antitrypsin deficiency at next appointment if patient is able to quit smoking. 2. 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 Plan for bronchoscopy with lavage and transbronchial biopsies with fluoroscopy on 10/24. 3. Mediastinal & Hilar Lymphadenopathy: Likely underlying sarcoidosis. Plan for bronchoscopy with airway inspection, transbronchial biopsies, and mediastinal lymph node biopsy with endobronchial ultrasound on 10/24. 4. Tobacco Use Disorder: Patient counseled for over 3 minutes of  need for complete tobacco cessation. Recommended using nicotine patches daily and gum for intermittent cravings. 5. TB Exposure: Quantiferon Gold obtained today. 6. Health Maintenance: Patient declines immunizations. 7. Follow-up: Patient to return to clinic in  4 weeks or sooner if needed.   Sonia Baller Ashok Cordia, M.D. [facility] Pulmonary & Critical Care Pager:  [phone] After 3pm or if no response, call [phone] 5:55 PM 11/19/15

## 2015-12-07 NOTE — Anesthesia Postprocedure Evaluation (Signed)
Anesthesia Post Note  Patient: Sarah Cruz  Procedure(s) Performed: Procedure(s) (LRB): ENDOBRONCHIAL ULTRASOUND (Bilateral)  Patient location during evaluation: PACU Anesthesia Type: MAC Level of consciousness: awake and alert Pain management: pain level controlled Vital Signs Assessment: post-procedure vital signs reviewed and stable Respiratory status: spontaneous breathing, nonlabored ventilation, respiratory function stable and patient connected to nasal cannula oxygen Cardiovascular status: stable and blood pressure returned to baseline Anesthetic complications: no    Last Vitals:  Vitals:   12/07/15 1530 12/07/15 1540  BP: 123/85 116/79  Pulse:  62  Resp: 15 14  Temp:      Last Pain:  Vitals:   12/07/15 1507  TempSrc: Oral                 [name]

## 2015-12-07 NOTE — Anesthesia Preprocedure Evaluation (Addendum)
Anesthesia Evaluation  Patient identified by MRN, date of birth, ID band Patient awake    Reviewed: Allergy & Precautions, NPO status , Patient's Chart, lab work & pertinent test results  Airway Mallampati: II  TM Distance: >3 FB Neck ROM: Full    Dental  (+) Dental Advisory Given, Missing, Chipped, Loose,    Pulmonary shortness of breath, asthma , Current Smoker,    Pulmonary exam normal breath sounds clear to auscultation       Cardiovascular hypertension, Normal cardiovascular exam Rhythm:Regular Rate:Normal     Neuro/Psych negative neurological ROS  negative psych ROS   GI/Hepatic negative GI ROS, Neg liver ROS,   Endo/Other  negative endocrine ROS  Renal/GU negative Renal ROS  negative genitourinary   Musculoskeletal negative musculoskeletal ROS (+)   Abdominal   Peds negative pediatric ROS (+)  Hematology negative hematology ROS (+)   Anesthesia Other Findings   Reproductive/Obstetrics negative OB ROS                           Anesthesia Physical Anesthesia Plan  ASA: II  Anesthesia Plan: General   Post-op Pain Management:    Induction: Intravenous  Airway Management Planned: Oral ETT  Additional Equipment:   Intra-op Plan:   Post-operative Plan: Extubation in OR  Informed Consent: I have reviewed the patients History and Physical, chart, labs and discussed the procedure including the risks, benefits and alternatives for the proposed anesthesia with the patient or authorized representative who has indicated his/her understanding and acceptance.   Dental advisory given  Plan Discussed with: CRNA  Anesthesia Plan Comments:         Anesthesia Quick Evaluation

## 2015-12-07 NOTE — Discharge Instructions (Signed)
Flexible Bronchoscopy, Care After These instructions give you information on caring for yourself after your procedure. Your doctor may also give you more specific instructions. Call your doctor if you have any problems or questions after your procedure. HOME CARE  Do not eat or drink anything for 2 hours after your procedure. If you try to eat or drink before the medicine wears off, food or drink could go into your lungs. You could also burn yourself.  After 2 hours have passed and when you can cough and gag normally, you may eat soft food and drink liquids slowly.  The day after the test, you may eat your normal diet.  You may do your normal activities.  Keep all doctor visits. GET HELP RIGHT AWAY IF:  You get more and more short of breath.  You get light-headed.  You feel like you are going to pass out (faint).  You have chest pain.  You have new problems that worry you.  You cough up more than a little blood.  You cough up more blood than before. MAKE SURE YOU:  Understand these instructions.  Will watch your condition.  Will get help right away if you are not doing well or get worse.   This information is not intended to replace advice given to you by your health care provider. Make sure you discuss any questions you have with your health care provider.   Document Released: 11/27/2008 Document Revised: 02/04/2013 Document Reviewed: 10/04/2012 Elsevier Interactive Patient Education 2016 [facility] immediate medical attention for any cough that is consistently producing a bloody mucus, chest discomfort or difficulty breathing. Call Dr. Ammie Dalton office if you have any persistent fever or cough producing a discolored mucus.

## 2015-12-07 NOTE — Interval H&P Note (Signed)
History and Physical Interval Note:  12/07/2015 12:55 PM  Sarah Cruz  has presented today for surgery, with the diagnosis of Lung nodule   The various methods of treatment have been discussed with the patient and family. After consideration of risks, benefits and other options for treatment, the patient has consented to  Procedure(s): ENDOBRONCHIAL ULTRASOUND (Bilateral) and transbronchial biopsies as a surgical intervention .  The patient's history has been reviewed, patient examined, no change in status, stable for surgery.  I have reviewed the patient's chart and labs.  Questions were answered to the patient's satisfaction.     Sonia Baller Ashok Cordia, M.D. [facility] Pulmonary & Critical Care Pager:  [phone] After 3pm or if no response, call [phone] 12:55 PM 12/07/15

## 2015-12-08 ENCOUNTER — Encounter (HOSPITAL_COMMUNITY): Payer: Self-pay | Admitting: Pulmonary Disease

## 2015-12-10 ENCOUNTER — Telehealth: Payer: Self-pay | Admitting: Pulmonary Disease

## 2015-12-10 NOTE — Telephone Encounter (Signed)
Contacted patient regarding results of biopsy which is most consistent with sarcoidosis and not malignancy. Patient encouraged to continue to aggressively try to quit smoking. Has follow-up with me on 11/15. She will continue to use her Symbicort inhaler as prescribed.

## 2015-12-29 ENCOUNTER — Other Ambulatory Visit: Payer: Medicaid Other

## 2015-12-29 ENCOUNTER — Encounter: Payer: Self-pay | Admitting: Pulmonary Disease

## 2015-12-29 ENCOUNTER — Ambulatory Visit (INDEPENDENT_AMBULATORY_CARE_PROVIDER_SITE_OTHER): Payer: Self-pay | Admitting: Pulmonary Disease

## 2015-12-29 VITALS — BP 122/86 | HR 76 | Ht 64.5 in | Wt 146.0 lb

## 2015-12-29 DIAGNOSIS — F17200 Nicotine dependence, unspecified, uncomplicated: Secondary | ICD-10-CM

## 2015-12-29 DIAGNOSIS — J449 Chronic obstructive pulmonary disease, unspecified: Secondary | ICD-10-CM

## 2015-12-29 DIAGNOSIS — D869 Sarcoidosis, unspecified: Secondary | ICD-10-CM

## 2015-12-29 NOTE — Patient Instructions (Addendum)
   Call me if you have any new breathing problems or questions before next appointment.  Remember to schedule an appointment with an Ophthalmologist to check you for sarcoidosis in your eyes.  I will see you back in 3 months or sooner if needed.   TESTS ORDERED: 1. Spirometry with DLCO at next appointment 2. 6MWT on room air at next appointment 3. Serum alpha-1 antitrypsin phenotype today

## 2015-12-29 NOTE — Progress Notes (Signed)
Subjective:    Patient ID: Sarah Cruz, female    DOB: 1966-02-06, 50 y.o.   MRN: [id number]  C.C.:  Follow-up for Severe COPD, Sarcoidosis, Tobacco Use Disorder, & H/O TB Exposure.  HPI Severe COPD: Started on Symbicort at last appointment. She reports she has no dyspnea. She reports significant improvement to near resolution to her wheezing & cough.   Sarcoidosis:  Bronchoscopy and biopsies highly suspicious/consistent with sarcoidosis. She reports she has had some eye redness in her eyes at work. Otherwise no blurry vision or eye pain. She reports no recent palpitations or chest tightness.  Tobacco Use Disorder:  Hasn't tried pills or nicotine replacement. She reports she has went back up to 1ppd with the stress from her father's recent illness with stage IV cancer.   H/O TB Exposure: Skin PPD negative & QuantiFERON TB pending.  Review of Systems No bruising or rashes. No focal vision loss, weakness, numbness or tingling. No fever, chills, or sweats.   No Known Allergies  Current Outpatient Prescriptions on File Prior to Visit  Medication Sig Dispense Refill  . acetaminophen (TYLENOL) 500 MG tablet Take 1,000 mg by mouth 2 (two) times daily as needed for mild pain or headache.    . budesonide-formoterol (SYMBICORT) 160-4.5 MCG/ACT inhaler Inhale 2 puffs into the lungs 2 (two) times daily. 2 Inhaler 0  . hydrochlorothiazide (HYDRODIURIL) 12.5 MG tablet Take 2 tablets (25 mg total) by mouth daily. 60 tablet 2  . loratadine (CLARITIN) 10 MG tablet Take 10 mg by mouth daily as needed (for sinus).     No current facility-administered medications on file prior to visit.     Past Medical History:  Diagnosis Date  . Childhood asthma   . Hypertension     Past Surgical History:  Procedure Laterality Date  . ABDOMINAL HYSTERECTOMY    . ENDOBRONCHIAL ULTRASOUND Bilateral 12/07/2015   Procedure: ENDOBRONCHIAL ULTRASOUND;  Surgeon: Javier Glazier, MD;  Location: [facility] ENDOSCOPY;   Service: Cardiopulmonary;  Laterality: Bilateral;    Family History  Problem Relation Age of Onset  . Heart failure Mother   . Diabetes Mother   . Hypertension Mother   . Arthritis Mother   . Brain cancer Sister   . Sickle cell anemia Other   . Brain cancer Other   . Asthma Other   . Rheumatologic disease Neg Hx     Social History   Social History  . Marital status: Divorced    Spouse name: N/A  . Number of children: 3  . Years of education: N/A   Social History Main Topics  . Smoking status: Current Every Day Smoker    Packs/day: 1.50    Years: 38.00    Types: Cigarettes    Start date: 04/08/1977  . Smokeless tobacco: Never Used     Comment: down to 0.5ppd  . Alcohol use No     Comment: Remote EtOH  . Drug use: No  . Sexual activity: Not Asked   Other Topics Concern  . None   Social History Narrative   Originally from [place], [place]. Moved to [facility] in November 2016. Prior travel to [place]. No travel outside the [place]. 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 No bird, mold, or hot tub exposure. No history of volunteer work or residence in a homeless shelter. No history of incarceration. Does have prior exposure to a nephew with TB but has always had a negative PPD skin test.  Objective:   Physical Exam BP 122/86 (BP Location: Left Arm, Cuff Size: Normal)   Pulse 76   Ht 5' 4.5" (1.638 m)   Wt 146 lb (66.2 kg)   SpO2 99%   BMI 24.67 kg/m  General:  Awake. No distress. Thin, Female. Integument:  Warm & dry. Tattoos noted on exposed skin.No icterus. Lymphatics:  No appreciated cervical or supraclavicular lymphadenoapthy. HEENT:  Moist mucus membranes. No scleral injection. No oral ulcers. Cardiovascular:  Regular rate and rhythm. No edema. Normal S1 & S2.  Pulmonary:  Normal work of breathing on room air and clear on auscultation. Speaking in complete sentences. Abdomen: Soft. Normal bowel sounds. Nontender. Musculoskeletal:  Normal bulk  and tone. No joint deformity or effusion appreciated.  PFT 11/15/15: FVC 1.81 L (61%) FEV1 1.05 L (44%) FEV1/FVC 0.58 FEF 25-75 0.54 L (21%) positive bronchodilator response TLC 5.29 L (103%) RV 187% ERV 75% DLC uncorrected 58%  6MWT 11/19/15:  Walked 444 meters / Baseline Sat 99% on RA / Nadir Sat 98% on RA @ end of test  IMAGING CT CHEST W/ 10/16/15 (previously reviewed by me): Bilateral parenchymal nodules and opacities. There is some mild cavitation in the nodules of the upper lobes suggested. No pleural effusion or thickening. Prominent axillary lymph nodes. Bilateral hilar lymphadenopathy measuring up to 1.8 cm as well as mediastinal adenopathy with subcarinal lymph node measuring approximately 2.2 cm in short axis. No pericardial effusion.  EKG 10/16/15 (previously reviewed by me): QTC 411 milliseconds. Normal sinus rhythm. No sign of conduction delay or suggestion of ischemia.  PATHOLOGY Right Lower Lobe Transbronchial Biopsy (12/07/15):  Benign Lung Tissue / No Malignancy EBUS FNA Level 7 (12/07/15):  Benign reactive/reparative changes suggesting granulomatous inflammation.   MICROBIOLOGY Quantiferon TB (11/19/15):  Negative  Skin PPD (10/25/15):  Negative Urine Histoplasma Ag (10/22/15):  Negative Urine Blastomyces Ag (10/22/15):  Negative   LABS 10/22/15 LFT: 4.6/8.2/0.3/68/15/13 CRP: 0.2 ESR: 28 LDH: 138 ACE:  63 ANCA Screen:  Negative  ANA:  Negative Anti-Jo1:  <0.2 Centrometere Ab Screen:  <0.2 DS DNA Ab:  1 RNP Ab:  0.3 Smith Ab:  <0.2 Chromatin Ab:  <0.2 RF:  <10 Anti-CCP:  <16 SSA:  <0.2 SSB:  0.4 SCL-70:  <0.2  10/16/15 Troponin I: 0.01 CBC: 4.4/12.0/39.3/316 BMP: 137/4.3/104/25/11/0.78/92/9.5 D-dimer:  <0.27    Assessment & Plan:  50 y.o. female with severe COPD, sarcoidosis, tobacco use disorder, and TB exposure. I reviewed the patient's biopsy results from her recent bronchoscopy. Findings consistent with sarcoidosis. I did spend a lengthy amount of time  today discussing the pathophysiology of sarcoidosis and potential for multiple organ involvement including eyes, bone marrow, neurological, cardiac, etc. I stressed the need for ophthalmologic exam to evaluate for possible ocular sarcoidosis and we also discussed the potential future need for lung transplantation if her pulmonary function continues to deteriorate in the setting of ongoing tobacco use. I instructed the patient contact my office if she had any new breathing problems or questions before next appointment.   1. Severe COPD: Continuing Symbicort 160/4.5. Screening for alpha-1 antitrypsin deficiency today. Repeat spirometry with DLCO at next appointment as well as 6 minute walk test on room air. 2. Sarcoidosis: Holding off on immunosuppression with prednisone at this time. Continuing yearly EKG and serum lab work to monitor for further involvement of sarcoidosis. Recommended ophthalmology exam to evaluate for ocular sarcoidosis.  3. Tobacco Use Disorder: Patient counseled for over 3 minutes of need for complete tobacco cessation. Recommended using nicotine  patches daily and gum for intermittent cravings again. 4. TB Exposure: Skin PPD test and QuantiFERON-TB negative. 5. Health Maintenance: Previously declined immunizations. 6. Follow-up: Patient to return to clinic in  months or sooner if needed.  Sonia Baller Ashok Cordia, M.D. [facility] Pulmonary & Critical Care Pager:  [phone] After 3pm or if no response, call [phone] 4:26 PM 12/29/15

## 2016-01-03 LAB — ALPHA-1 ANTITRYPSIN PHENOTYPE: A1 ANTITRYPSIN: 151 mg/dL (ref 83–199)

## 2016-01-29 ENCOUNTER — Other Ambulatory Visit: Payer: Self-pay | Admitting: Physician Assistant

## 2016-02-08 ENCOUNTER — Telehealth: Payer: Self-pay | Admitting: Pulmonary Disease

## 2016-02-08 NOTE — Telephone Encounter (Signed)
Attempted to contact pt. No answer, no option to leave a message. Will try back.  

## 2016-02-10 NOTE — Telephone Encounter (Signed)
lmomtcb x 2  

## 2016-02-11 MED ORDER — BUDESONIDE-FORMOTEROL FUMARATE 160-4.5 MCG/ACT IN AERO: 2.0000 | INHALATION_SPRAY | Freq: Two times a day (BID) | RESPIRATORY_TRACT | 1 refills | Status: DC

## 2016-02-11 NOTE — Telephone Encounter (Signed)
Patient called back, she is out of medication and needs this called in today.  She is at work, [phone].

## 2016-02-11 NOTE — Telephone Encounter (Signed)
I refilled her Symbicort and left detailed msg on VM letting her know that this was done

## 2016-02-11 NOTE — Telephone Encounter (Signed)
lmomtcb x 3  

## 2016-02-21 ENCOUNTER — Telehealth: Payer: Self-pay | Admitting: Pulmonary Disease

## 2016-02-21 NOTE — Telephone Encounter (Signed)
Samples have been given to the pt. Nothing further was needed. 

## 2016-03-01 ENCOUNTER — Other Ambulatory Visit: Payer: Self-pay | Admitting: Internal Medicine

## 2016-03-06 ENCOUNTER — Telehealth: Payer: Self-pay | Admitting: Internal Medicine

## 2016-03-06 MED ORDER — HYDROCHLOROTHIAZIDE 12.5 MG PO TABS: ORAL_TABLET | ORAL | 0 refills | Status: DC

## 2016-03-06 NOTE — Telephone Encounter (Signed)
Refilled x 30 days - patient must have office visit for any further refills. 

## 2016-03-06 NOTE — Telephone Encounter (Signed)
Medication Refill: hydrochlorothiazide (HYDRODIURIL) 12.5 MG tablet   Pt has appointment scheduled 1/26 to establish with PCP but states she only has two pills left

## 2016-03-10 ENCOUNTER — Encounter: Payer: Self-pay | Admitting: Family Medicine

## 2016-03-10 ENCOUNTER — Ambulatory Visit: Payer: Self-pay | Attending: Family Medicine | Admitting: Family Medicine

## 2016-03-10 VITALS — BP 102/64 | HR 83 | Temp 98.4°F | Resp 18 | Ht 64.0 in | Wt 149.6 lb

## 2016-03-10 DIAGNOSIS — H6123 Impacted cerumen, bilateral: Secondary | ICD-10-CM

## 2016-03-10 DIAGNOSIS — I10 Essential (primary) hypertension: Secondary | ICD-10-CM | POA: Insufficient documentation

## 2016-03-10 DIAGNOSIS — J0100 Acute maxillary sinusitis, unspecified: Secondary | ICD-10-CM | POA: Insufficient documentation

## 2016-03-10 DIAGNOSIS — Z7951 Long term (current) use of inhaled steroids: Secondary | ICD-10-CM | POA: Insufficient documentation

## 2016-03-10 DIAGNOSIS — J449 Chronic obstructive pulmonary disease, unspecified: Secondary | ICD-10-CM | POA: Insufficient documentation

## 2016-03-10 MED ORDER — AMOXICILLIN-POT CLAVULANATE 875-125 MG PO TABS: 1.0000 | ORAL_TABLET | Freq: Two times a day (BID) | ORAL | 0 refills | Status: DC

## 2016-03-10 MED ORDER — BUDESONIDE-FORMOTEROL FUMARATE 160-4.5 MCG/ACT IN AERO: 2.0000 | INHALATION_SPRAY | Freq: Two times a day (BID) | RESPIRATORY_TRACT | 0 refills | Status: DC

## 2016-03-10 MED ORDER — CARBAMIDE PEROXIDE 6.5 % OT SOLN: 10.0000 [drp] | Freq: Two times a day (BID) | OTIC | 0 refills | Status: DC

## 2016-03-10 NOTE — Progress Notes (Signed)
Subjective:  Patient ID: Sarah Cruz, female    DOB: 28-Sep-1965  Age: 51 y.o. MRN: [id number]  CC: Establish Care   HPI Sarah Cruz presents for HTN. She denies any CP, SOB, palpitations, or BLE swelling. She c/o sinus pressure for two weeks. Symptoms include green, foul-smelling nasal drainage. She reports history of a cold symptoms and fever two weeks prior to onset of symptoms. Denies any recent history of fevers, body aches, cold chills, or cough.   Outpatient Medications Prior to Visit  Medication Sig Dispense Refill  . acetaminophen (TYLENOL) 500 MG tablet Take 1,000 mg by mouth 2 (two) times daily as needed for mild pain or headache.    . budesonide-formoterol (SYMBICORT) 160-4.5 MCG/ACT inhaler Inhale 2 puffs into the lungs 2 (two) times daily. 1 Inhaler 1  . hydrochlorothiazide (HYDRODIURIL) 12.5 MG tablet TAKE 2 TABLETS(25 MG) BY MOUTH DAILY 60 tablet 0  . loratadine (CLARITIN) 10 MG tablet Take 10 mg by mouth daily as needed (for sinus).     No facility-administered medications prior to visit.     ROS Review of Systems  Constitutional: Negative.   HENT: Positive for rhinorrhea and sinus pressure.   Eyes: Negative.   Respiratory: Negative.   Cardiovascular: Negative.     Objective:  BP 102/64 (BP Location: Left Arm, Patient Position: Sitting, Cuff Size: Normal)   Pulse 83   Temp 98.4 F (36.9 C) (Oral)   Resp 18   Ht 5\' 4"  (1.626 m)   Wt 149 lb 9.6 oz (67.9 kg)   SpO2 100%   BMI 25.68 kg/m   BP/Weight 03/10/2016 12/29/2015 [id number]  Systolic BP [id number]  Diastolic BP 64 86 79  Wt. (Lbs) 149.6 146 146  BMI 25.68 24.67 25.06   Physical Exam  HENT:  Right Ear: External ear normal.  Left Ear: External ear normal.  Nose: Rhinorrhea (dried nasal discharge present) present. Right sinus exhibits maxillary sinus tenderness. Left sinus exhibits maxillary sinus tenderness.  Mouth/Throat: Oropharynx is clear and moist. No oropharyngeal exudate.    Moderate amount of soft cerumen present in bilateral ear canals.  Eyes: Right eye exhibits no discharge. Left eye exhibits no discharge.  Cardiovascular: Normal rate, regular rhythm, normal heart sounds and intact distal pulses.   Pulmonary/Chest: Effort normal and breath sounds normal.  Abdominal: Soft. Bowel sounds are normal.   Assessment & Plan:   Problem List Items Addressed This Visit      Cardiovascular and Mediastinum   Essential hypertension - Primary   -Patients request to come back next week for labs.   Relevant Orders   BASIC METABOLIC PANEL WITH GFR   Microalbumin/Creatinine Ratio, Urine     Respiratory   COPD, severe (HCC)   Relevant Medications   budesonide-formoterol (SYMBICORT) 160-4.5 MCG/ACT inhaler    Other Visit Diagnoses    Acute non-recurrent maxillary sinusitis       Relevant Medications   amoxicillin-clavulanate (AUGMENTIN) 875-125 MG tablet   Excessive cerumen in both ear canals       Relevant Medications   carbamide peroxide (DEBROX) 6.5 % otic solution     Meds ordered this encounter  Medications  . budesonide-formoterol (SYMBICORT) 160-4.5 MCG/ACT inhaler    Sig: Inhale 2 puffs into the lungs 2 (two) times daily.    Dispense:  1 Inhaler    Refill:  0    Order Specific Question:   Supervising Provider    Answer:   Tresa Garter [id number]  .  amoxicillin-clavulanate (AUGMENTIN) 875-125 MG tablet    Sig: Take 1 tablet by mouth 2 (two) times daily.    Dispense:  14 tablet    Refill:  0    Order Specific Question:   Supervising Provider    Answer:   Tresa Garter [id number]  . carbamide peroxide (DEBROX) 6.5 % otic solution    Sig: Place 10 drops into both ears 2 (two) times daily.    Dispense:  15 mL    Refill:  0    Order Specific Question:   Supervising Provider    Answer:   Tresa Garter [id number]      Alfonse Spruce FNP

## 2016-03-10 NOTE — Patient Instructions (Signed)

## 2016-03-10 NOTE — Progress Notes (Signed)
Patient is here for High Blood pressure  Patient also complains of a sinus infection with green mucus that has an odor  Headache along the sinus that comes and goes

## 2016-03-13 ENCOUNTER — Ambulatory Visit: Payer: Self-pay | Attending: Family Medicine

## 2016-03-13 DIAGNOSIS — I10 Essential (primary) hypertension: Secondary | ICD-10-CM | POA: Insufficient documentation

## 2016-03-13 NOTE — Progress Notes (Signed)
Patient here for lab visit only 

## 2016-03-14 LAB — MICROALBUMIN / CREATININE URINE RATIO
CREATININE, URINE: 202 mg/dL (ref 20–320)
Microalb Creat Ratio: 25 mcg/mg creat (ref <30)
Microalb, Ur: 5.1 mg/dL

## 2016-03-14 LAB — BASIC METABOLIC PANEL WITH GFR
BUN: 13 mg/dL (ref 7–25)
CALCIUM: 9.3 mg/dL (ref 8.6–10.4)
CO2: 29 mmol/L (ref 20–31)
CREATININE: 0.97 mg/dL (ref 0.50–1.05)
Chloride: 103 mmol/L (ref 98–110)
GFR, EST AFRICAN AMERICAN: 79 mL/min (ref >=60)
GFR, Est Non African American: 68 mL/min (ref >=60)
Glucose, Bld: 81 mg/dL (ref 65–99)
Potassium: 3.7 mmol/L (ref 3.5–5.3)
SODIUM: 140 mmol/L (ref 135–146)

## 2016-03-27 ENCOUNTER — Telehealth: Payer: Self-pay | Admitting: *Deleted

## 2016-03-27 NOTE — Telephone Encounter (Signed)
Medical Assistant left message on patient's home and cell voicemail. Voicemail states to give a call back to Nubia with [facility] at [phone]. Patient is aware of kidney function being normal. 

## 2016-03-27 NOTE — Telephone Encounter (Signed)
-----   Message from Alfonse Spruce, [place] sent at 03/21/2016  4:42 PM EST ----- Microalbumin/creatinine ratio level was normal. This tests for protein in your urine that can indicate early signs of kidney damage.  Kidney function is normal.

## 2016-04-04 ENCOUNTER — Other Ambulatory Visit: Payer: Self-pay | Admitting: Pulmonary Disease

## 2016-04-04 DIAGNOSIS — R0600 Dyspnea, unspecified: Secondary | ICD-10-CM

## 2016-04-05 ENCOUNTER — Other Ambulatory Visit: Payer: Self-pay | Admitting: Internal Medicine

## 2016-04-05 ENCOUNTER — Ambulatory Visit (INDEPENDENT_AMBULATORY_CARE_PROVIDER_SITE_OTHER): Payer: Self-pay | Admitting: Pulmonary Disease

## 2016-04-05 ENCOUNTER — Encounter: Payer: Self-pay | Admitting: Pulmonary Disease

## 2016-04-05 VITALS — BP 110/68 | HR 65 | Ht 64.0 in | Wt 147.2 lb

## 2016-04-05 DIAGNOSIS — R0600 Dyspnea, unspecified: Secondary | ICD-10-CM

## 2016-04-05 DIAGNOSIS — F17200 Nicotine dependence, unspecified, uncomplicated: Secondary | ICD-10-CM

## 2016-04-05 DIAGNOSIS — J449 Chronic obstructive pulmonary disease, unspecified: Secondary | ICD-10-CM

## 2016-04-05 DIAGNOSIS — D869 Sarcoidosis, unspecified: Secondary | ICD-10-CM

## 2016-04-05 LAB — PULMONARY FUNCTION TEST
DL/VA % pred: 83 %
DL/VA: 4.04 ml/min/mmHg/L
DLCO COR % PRED: 61 %
DLCO COR: 15.24 ml/min/mmHg
DLCO UNC % PRED: 62 %
DLCO UNC: 15.46 ml/min/mmHg
FEF 25-75 POST: 0.95 L/s
FEF 25-75 PRE: 0.65 L/s
FEF2575-%Change-Post: 45 %
FEF2575-%PRED-PRE: 26 %
FEF2575-%Pred-Post: 38 %
FEV1-%Change-Post: 10 %
FEV1-%PRED-POST: 66 %
FEV1-%PRED-PRE: 60 %
FEV1-POST: 1.56 L
FEV1-Pre: 1.41 L
FEV1FVC-%Change-Post: 3 %
FEV1FVC-%PRED-PRE: 72 %
FEV6-%CHANGE-POST: 8 %
FEV6-%Pred-Post: 89 %
FEV6-%Pred-Pre: 82 %
FEV6-POST: 2.56 L
FEV6-Pre: 2.36 L
FEV6FVC-%CHANGE-POST: 1 %
FEV6FVC-%PRED-POST: 103 %
FEV6FVC-%Pred-Pre: 101 %
FVC-%Change-Post: 6 %
FVC-%PRED-PRE: 81 %
FVC-%Pred-Post: 87 %
FVC-POST: 2.56 L
FVC-PRE: 2.4 L
PRE FEV1/FVC RATIO: 59 %
Post FEV1/FVC ratio: 61 %
Post FEV6/FVC ratio: 100 %
Pre FEV6/FVC Ratio: 98 %
RV % pred: 119 %
RV: 2.18 L
TLC % PRED: 88 %
TLC: 4.54 L

## 2016-04-05 MED ORDER — BUDESONIDE-FORMOTEROL FUMARATE 160-4.5 MCG/ACT IN AERO: 2.0000 | INHALATION_SPRAY | Freq: Two times a day (BID) | RESPIRATORY_TRACT | 0 refills | Status: DC

## 2016-04-05 NOTE — Progress Notes (Signed)
Test reviewed.  

## 2016-04-05 NOTE — Patient Instructions (Addendum)
   Call me if you have any new breathing problems or questions before your next appointment.  Remember to fill out the paperwork to see if we can get your Symbicort for free.   I'm going to see you back in 6 months or sooner if needed.

## 2016-04-05 NOTE — Addendum Note (Signed)
Addended by: Tyson Dense on: 04/05/2016 05:24 PM   Modules accepted: Orders

## 2016-04-05 NOTE — Progress Notes (Signed)
Subjective:    Patient ID: Sarah Cruz, female    DOB: January 17, 1966, 51 y.o.   MRN: [id number]  C.C.:  Follow-up for Severe COPD, Sarcoidosis, Tobacco Use Disorder, & H/O TB Exposure.  HPI  Severe COPD: Patient is having difficulty affording Symbicort. She reports she had coughing & wheezing with her previous URI but this resolved. She is still having dyspnea with running & extreme exertion.   Sarcoidosis: Primarily lung involvement. Currently on steroid therapy. She reports she hasn't seen an opthalmologist and her erythema in her eyes resolved. Rare palpitations - 1-2 times - in last 3 months. No syncope or near syncope.   Tobacco use disorder: Previously smoking up to one pack per day with prior stress from her father's recent diagnosis of lung cancer. She has been trying to quit but hasn't been able. She has been smoking somewhere around 1/2ppd.   History of TB exposure: Skin PPD negative & QuantiFERON-TB negative.   Review of Systems No focal weakness, numbness or tingling. No rashes or abnormal bruising. No abdominal pain, nausea, or emesis.   No Known Allergies  Current Outpatient Prescriptions on File Prior to Visit  Medication Sig Dispense Refill  . acetaminophen (TYLENOL) 500 MG tablet Take 1,000 mg by mouth 2 (two) times daily as needed for mild pain or headache.    Marland Kitchen amoxicillin-clavulanate (AUGMENTIN) 875-125 MG tablet Take 1 tablet by mouth 2 (two) times daily. (Patient not taking: Reported on 04/05/2016) 14 tablet 0  . budesonide-formoterol (SYMBICORT) 160-4.5 MCG/ACT inhaler Inhale 2 puffs into the lungs 2 (two) times daily. (Patient not taking: Reported on 04/05/2016) 1 Inhaler 1  . budesonide-formoterol (SYMBICORT) 160-4.5 MCG/ACT inhaler Inhale 2 puffs into the lungs 2 (two) times daily. (Patient not taking: Reported on 04/05/2016) 1 Inhaler 0  . carbamide peroxide (DEBROX) 6.5 % otic solution Place 10 drops into both ears 2 (two) times daily. (Patient not taking:  Reported on 04/05/2016) 15 mL 0  . loratadine (CLARITIN) 10 MG tablet Take 10 mg by mouth daily as needed (for sinus).     No current facility-administered medications on file prior to visit.     Past Medical History:  Diagnosis Date  . Childhood asthma   . Hypertension     Past Surgical History:  Procedure Laterality Date  . ABDOMINAL HYSTERECTOMY    . ENDOBRONCHIAL ULTRASOUND Bilateral 12/07/2015   Procedure: ENDOBRONCHIAL ULTRASOUND;  Surgeon: Javier Glazier, MD;  Location: [facility];  Service: Cardiopulmonary;  Laterality: Bilateral;    Family History  Problem Relation Age of Onset  . Heart failure Mother   . Diabetes Mother   . Hypertension Mother   . Arthritis Mother   . Brain cancer Sister   . Sickle cell anemia Other   . Brain cancer Other   . Asthma Other   . Rheumatologic disease Neg Hx     Social History   Social History  . Marital status: Divorced    Spouse name: N/A  . Number of children: 3  . Years of education: N/A   Social History Main Topics  . Smoking status: Current Every Day Smoker    Packs/day: 1.50    Years: 38.00    Types: Cigarettes    Start date: 04/08/1977  . Smokeless tobacco: Never Used     Comment: down to 0.5ppd  . Alcohol use No     Comment: Remote EtOH  . Drug use: No  . Sexual activity: Not Asked   Other Topics Concern  .  None   Social History Narrative   Originally from [place], [place]. Moved to [facility] in November 2016. Prior travel to [place]. No travel outside the [place]. 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 No bird, mold, or hot tub exposure. No history of volunteer work or residence in a homeless shelter. No history of incarceration. Does have prior exposure to a nephew with TB but has always had a negative PPD skin test.       Objective:   Physical Exam BP 110/68 (BP Location: Right Arm, Patient Position: Sitting, Cuff Size: Normal)   Pulse 65   Ht 5' 4"  (1.626 m)   Wt 147 lb 3.2 oz (66.8 kg)    SpO2 98%   BMI 25.27 kg/m  Gen.: Fit female. No distress.  Awake. Integument: Warm and dry. No rash on exposed skin. HEENT: No scleral icterus or injection. Pupils symmetric and reactive. No oral ulcers. Cardiovascular: Regular rate. Normal S1 & S2. No murmurs, rubs, or gallops. Pulmonary: Overall clear on auscultation. No accessory muscle use. Symmetric chest wall expansion. Abdomen: Soft. Nondistended. Nontender.  Neurological: Cranial nerves II-12 intact. No meningismus. Moving all 4 extremities equally.  PFT 04/05/16: FVC 2.40 L (81%) FEV1 1.41 L (60%) FEV1/FVC 0.59 FEF 25-75 0.65 L (26%) negative bronchodilator response   TLC 4.54 L (80%) RV 119% ERV 117% DLCO corrected 61% 11/12/15: FVC 1.81 L (61%) FEV1 1.05 L (44%) FEV1/FVC 0.58 FEF 25-75 0.54 L (21%) positive bronchodilator response    TLC 5.29 L (103%) RV 187% ERV 75% DLC uncorrected 58%  6MWT 04/05/16:  Walked 437 meters / Baseline Sat 100% on RA / Nadir Sat 99% on RA @ end of test 11/19/15:  Walked 444 meters / Baseline Sat 99% on RA / Nadir Sat 98% on RA @ end of test  IMAGING CT CHEST W/ 10/16/15 (previously reviewed by me): Bilateral parenchymal nodules and opacities. There is some mild cavitation in the nodules of the upper lobes suggested. No pleural effusion or thickening. Prominent axillary lymph nodes. Bilateral hilar lymphadenopathy measuring up to 1.8 cm as well as mediastinal adenopathy with subcarinal lymph node measuring approximately 2.2 cm in short axis. No pericardial effusion.  EKG 10/16/15 (previously reviewed by me): QTC 411 milliseconds. Normal sinus rhythm. No sign of conduction delay or suggestion of ischemia.  PATHOLOGY Right Lower Lobe Transbronchial Biopsy (12/07/15):  Benign Lung Tissue / No Malignancy EBUS FNA Level 7 (12/07/15):  Benign reactive/reparative changes suggesting granulomatous inflammation.   MICROBIOLOGY Quantiferon TB (11/19/15):  Negative  Skin PPD (10/25/15):  Negative Urine  Histoplasma Ag (10/22/15):  Negative Urine Blastomyces Ag (10/22/15):  Negative   LABS On/15/17 Alpha-1 antitrypsin: MM (151)  10/22/15 LFT: 4.6/8.2/0.3/68/15/13 CRP: 0.2 ESR: 28 LDH: 138 ACE:  63 ANCA Screen:  Negative  ANA:  Negative Anti-Jo1:  <0.2 Centrometere Ab Screen:  <0.2 DS DNA Ab:  1 RNP Ab:  0.3 Smith Ab:  <0.2 Chromatin Ab:  <0.2 RF:  <10 Anti-CCP:  <16 SSA:  <0.2 SSB:  0.4 SCL-70:  <0.2  10/16/15 Troponin I: 0.01 CBC: 4.4/12.0/39.3/316 BMP: 137/4.3/104/25/11/0.78/92/9.5 D-dimer:  <0.27    Assessment & Plan:  51 y.o. female with severe COPD, sarcoidosis, tobacco use disorder, & history of TB exposure. Patient's skin PPD test and QuantiFERON-TB have been negative.  Patient spirometry today has significantly improved since previous appointment/testing at her bronchodilator challenge showed no significant response indicating Ace significant improvement over previous testing. I do believe that her inhaled corticosteroid therapy not  only helping her underlying COPD but also possible airway involvement of her sarcoidosis. As such, I feel that continuing inhaled steroids with great benefit and an absolute necessity for the patient. I do not feel that initiating prednisone therapy at this time is necessary given her absence of any other organ involvement as I can tell. We did discuss her tobacco use at length and patient is going to continue to try to taper off their use. I instructed the patient to contact my office if she had any new breathing problems or questions before next appointment.  1. Severe COPD: Continuing patient on Symbicort. Given samples today. Also given prescription drug assistance paperwork. 2. Sarcoidosis: Continuing to hold on initiation of further immunosuppression. Patient counseled to notify me for any new symptoms also ensure she establishes with an ophthalmologist for yearly eye exams. 3. Tobacco use disorder: Patient counseled for over 3 minutes and  need for complete tobacco cessation to prevent worsening of her underlying COPD. 4. Health maintenance: Patient previously declined immunizations. 5. Follow-up: Return to clinic in  6 months or sooner if needed.  Sonia Baller Ashok Cordia, M.D. [facility] Pager:  [phone] After 3pm or if no response, call [phone] 4:40 PM 04/05/16

## 2016-05-09 ENCOUNTER — Telehealth: Payer: Self-pay | Admitting: Family Medicine

## 2016-05-09 ENCOUNTER — Other Ambulatory Visit: Payer: Self-pay | Admitting: Family Medicine

## 2016-05-09 DIAGNOSIS — J449 Chronic obstructive pulmonary disease, unspecified: Secondary | ICD-10-CM

## 2016-05-09 MED ORDER — BUDESONIDE-FORMOTEROL FUMARATE 160-4.5 MCG/ACT IN AERO: 2.0000 | INHALATION_SPRAY | Freq: Two times a day (BID) | RESPIRATORY_TRACT | 3 refills | Status: DC

## 2016-05-09 NOTE — Telephone Encounter (Signed)
Will defer to [facility], no follow up specified so I am not sure the amount of refills to provide.

## 2016-05-09 NOTE — Telephone Encounter (Signed)
Medication refilled with 90 day supply. Patient is being followed by pulmonary specialist. She can schedule routine follow up with PCP in 2 to 3 months.

## 2016-05-09 NOTE — Telephone Encounter (Signed)
Pt is calling to request a refill of her Symbicort inhaler. Pt requests a call to inform her as to wether it will be filled or not. Would also like to have it called into our pharmacy here. Please f/u with pt.

## 2016-05-10 MED FILL — !SYMBICORT 160-4.5 MCG INH: 160-4.5 | 15 days supply | Qty: 1 | Fill #0

## 2016-05-10 NOTE — Telephone Encounter (Signed)
CMA call to inform patient that her prescription is already filled & that is ready at pharmacy & if have any question just to call  Patient did not answer but left a VM stating the information

## 2016-06-15 ENCOUNTER — Telehealth: Payer: Self-pay | Admitting: Pulmonary Disease

## 2016-06-15 MED ORDER — BUDESONIDE-FORMOTEROL FUMARATE 160-4.5 MCG/ACT IN AERO: 2.0000 | INHALATION_SPRAY | Freq: Two times a day (BID) | RESPIRATORY_TRACT | 0 refills | Status: DC

## 2016-06-15 NOTE — Telephone Encounter (Signed)
Pt requesting symbicort sample.  This has been placed up front for pickup.  Nothing further needed.

## 2016-07-19 ENCOUNTER — Telehealth: Payer: Self-pay | Admitting: Pulmonary Disease

## 2016-07-19 MED ORDER — BUDESONIDE-FORMOTEROL FUMARATE 160-4.5 MCG/ACT IN AERO: 2.0000 | INHALATION_SPRAY | Freq: Two times a day (BID) | RESPIRATORY_TRACT | 0 refills | Status: DC

## 2016-07-19 NOTE — Telephone Encounter (Signed)
lmtcb x1 for pt. 2 samples of symbicort have been left up front for pt.

## 2016-07-20 NOTE — Telephone Encounter (Signed)
Message has been left on pt's named voicemail letting her know that her samples are ready to be picked up. Nothing further was needed.

## 2016-07-21 ENCOUNTER — Ambulatory Visit (INDEPENDENT_AMBULATORY_CARE_PROVIDER_SITE_OTHER): Payer: Self-pay | Admitting: Pulmonary Disease

## 2016-07-21 ENCOUNTER — Encounter: Payer: Self-pay | Admitting: Pulmonary Disease

## 2016-07-21 VITALS — BP 120/70 | HR 82 | Ht 64.0 in | Wt 155.8 lb

## 2016-07-21 DIAGNOSIS — J449 Chronic obstructive pulmonary disease, unspecified: Secondary | ICD-10-CM

## 2016-07-21 DIAGNOSIS — D869 Sarcoidosis, unspecified: Secondary | ICD-10-CM

## 2016-07-21 DIAGNOSIS — F17200 Nicotine dependence, unspecified, uncomplicated: Secondary | ICD-10-CM

## 2016-07-21 NOTE — Progress Notes (Signed)
Subjective:    Patient ID: Sarah Cruz, female    DOB: 05-24-1965, 51 y.o.   MRN: [id number]  C.C.:  Follow-up for Severe COPD, Sarcoidosis, Tobacco Use Disorder, & H/O TB Exposure.  HPI  Severe COPD: Patient previously was having difficulty affording Symbicort. She was given drug assistance paperwork at last appointment.  She reports minimal coughing. She does have some rare wheezing. She reports her dyspnea has improved.   Sarcoidosis: Primarily lung involvement. Previously only on an inhaled corticosteroid therapy. No syncope or near syncope. Rare palpitations. No eye swelling, pain, or blurry vision.   Tobacco use disorder: I last appointment patient was smoking approximately one half pack per day. Patient's father previously diagnosed with lung cancer. She is still smoking 10 cigarettes per day.   History of TB exposure: Skin PPD negative & QuantiFERON-TB negative.   Review of Systems She has gained weight. Denies any fever, chills, or sweats. No chest pain or pressure. No rashes or bruising.   No Known Allergies  Current Outpatient Prescriptions on File Prior to Visit  Medication Sig Dispense Refill  . acetaminophen (TYLENOL) 500 MG tablet Take 1,000 mg by mouth 2 (two) times daily as needed for mild pain or headache.    Marland Kitchen amoxicillin-clavulanate (AUGMENTIN) 875-125 MG tablet Take 1 tablet by mouth 2 (two) times daily. 14 tablet 0  . budesonide-formoterol (SYMBICORT) 160-4.5 MCG/ACT inhaler Inhale 2 puffs into the lungs 2 (two) times daily. 1 Inhaler 3  . budesonide-formoterol (SYMBICORT) 160-4.5 MCG/ACT inhaler Inhale 2 puffs into the lungs 2 (two) times daily. 1 Inhaler 0  . carbamide peroxide (DEBROX) 6.5 % otic solution Place 10 drops into both ears 2 (two) times daily. 15 mL 0  . hydrochlorothiazide (HYDRODIURIL) 12.5 MG tablet TAKE 2 TABLET BY MOUTH ONCE DAILY 60 tablet 3  . budesonide-formoterol (SYMBICORT) 160-4.5 MCG/ACT inhaler Inhale 2 puffs into the lungs 2 (two)  times daily. 1 Inhaler 0   No current facility-administered medications on file prior to visit.     Past Medical History:  Diagnosis Date  . Childhood asthma   . Hypertension     Past Surgical History:  Procedure Laterality Date  . ABDOMINAL HYSTERECTOMY    . ENDOBRONCHIAL ULTRASOUND Bilateral 12/07/2015   Procedure: ENDOBRONCHIAL ULTRASOUND;  Surgeon: Javier Glazier, MD;  Location: [facility];  Service: Cardiopulmonary;  Laterality: Bilateral;    Family History  Problem Relation Age of Onset  . Heart failure Mother   . Diabetes Mother   . Hypertension Mother   . Arthritis Mother   . Brain cancer Sister   . Sickle cell anemia Other   . Brain cancer Other   . Asthma Other   . Rheumatologic disease Neg Hx     Social History   Social History  . Marital status: Divorced    Spouse name: N/A  . Number of children: 3  . Years of education: N/A   Social History Main Topics  . Smoking status: Current Every Day Smoker    Packs/day: 1.50    Years: 38.00    Types: Cigarettes    Start date: 04/08/1977  . Smokeless tobacco: Never Used     Comment: down to 0.5ppd  . Alcohol use No     Comment: Remote EtOH  . Drug use: No  . Sexual activity: Not Asked   Other Topics Concern  . None   Social History Narrative   Originally from [place], [place]. Moved to [facility] in November 2016. Prior travel  to [place]. No travel outside the [place]. 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 No bird, mold, or hot tub exposure. No history of volunteer work or residence in a homeless shelter. No history of incarceration. Does have prior exposure to a nephew with TB but has always had a negative PPD skin test.       Objective:   Physical Exam BP 120/70 (BP Location: Left Arm, Cuff Size: Normal)   Pulse 82   Ht 5' 4"  (1.626 m)   Wt 155 lb 12.8 oz (70.7 kg)   SpO2 99%   BMI 26.74 kg/m   General:  After female. Awake. Alert. No distress.  Integument:  Warm & dry. Tattoos  noted. No rash. Extremities:  No cyanosis or clubbing.  HEENT:  Moist mucus membranes. Minimal nasal turbinate swelling. No oral ulcers. Cardiovascular:  Regular rate & rhythm. No edema.  Pulmonary:  Overall clear with auscultation. No accessory muscle use on room air. Abdomen: Soft. Normal bowel sounds. Nondistended. Musculoskeletal:  Normal bulk and tone. No joint deformity or effusion appreciated.  PFT 04/05/16: FVC 2.40 L (81%) FEV1 1.41 L (60%) FEV1/FVC 0.59 FEF 25-75 0.65 L (26%) negative bronchodilator response   TLC 4.54 L (80%) RV 119% ERV 117% DLCO corrected 61% 11/12/15: FVC 1.81 L (61%) FEV1 1.05 L (44%) FEV1/FVC 0.58 FEF 25-75 0.54 L (21%) positive bronchodilator response    TLC 5.29 L (103%) RV 187% ERV 75% DLC uncorrected 58%  6MWT 04/05/16:  Walked 437 meters / Baseline Sat 100% on RA / Nadir Sat 99% on RA @ end of test 11/19/15:  Walked 444 meters / Baseline Sat 99% on RA / Nadir Sat 98% on RA @ end of test  IMAGING CT CHEST W/ 10/16/15 (previously reviewed by me): Bilateral parenchymal nodules and opacities. There is some mild cavitation in the nodules of the upper lobes suggested. No pleural effusion or thickening. Prominent axillary lymph nodes. Bilateral hilar lymphadenopathy measuring up to 1.8 cm as well as mediastinal adenopathy with subcarinal lymph node measuring approximately 2.2 cm in short axis. No pericardial effusion.  EKG 10/16/15 (previously reviewed by me): QTC 411 milliseconds. Normal sinus rhythm. No sign of conduction delay or suggestion of ischemia.  PATHOLOGY Right Lower Lobe Transbronchial Biopsy (12/07/15):  Benign Lung Tissue / No Malignancy EBUS FNA Level 7 (12/07/15):  Benign reactive/reparative changes suggesting granulomatous inflammation.   MICROBIOLOGY Quantiferon TB (11/19/15):  Negative  Skin PPD (10/25/15):  Negative Urine Histoplasma Ag (10/22/15):  Negative Urine Blastomyces Ag (10/22/15):  Negative   LABS On/15/17 Alpha-1 antitrypsin:  MM (151)  10/22/15 LFT: 4.6/8.2/0.3/68/15/13 CRP: 0.2 ESR: 28 LDH: 138 ACE:  63 ANCA Screen:  Negative  ANA:  Negative Anti-Jo1:  <0.2 Centrometere Ab Screen:  <0.2 DS DNA Ab:  1 RNP Ab:  0.3 Smith Ab:  <0.2 Chromatin Ab:  <0.2 RF:  <10 Anti-CCP:  <16 SSA:  <0.2 SSB:  0.4 SCL-70:  <0.2  10/16/15 Troponin I: 0.01 CBC: 4.4/12.0/39.3/316 BMP: 137/4.3/104/25/11/0.78/92/9.5 D-dimer:  <0.27    Assessment & Plan:  51 y.o. female with severe COPD, sarcoidosis, and tobacco use disorder. Patient is concerned that she has gained some weight and certainly this could be due to the decreased use of cigarettes. Symptomatically her COPD and sarcoidosis seem to be very well controlled with her current dose of Symbicort 160/4.5. I did advise her there is limited systemic absorption of the inhaled corticosteroid but it is reasonable to consider de-escalation of her inhaled corticosteroid.  I instructed the patient to contact my office if she had any new breathing problems or questions before her next appointment.  1. Severe COPD:  Trying patient on Symbicort 80/4.5. Patient to notify me if this helps with weight gain & seems to remain effective. Repeat spirometry with bronchodilator challenge at next appointment. 2. Sarcoidosis: Attempting to taper her inhaled corticosteroid slightly. Continuing to hold off on further immunosuppression or oral steroids. 3. Tobacco use disorder: Patient counseled for over 3 minutes and need for complete tobacco cessation. 4. Health maintenance: Previously declined immunization. 5. Follow-up: Return to clinic in 3 months or sooner if needed.  Sonia Baller Ashok Cordia, M.D. [facility] Pulmonary & Critical Care Pager:  [phone] After 3pm or if no response, call [phone] 11:00 AM 07/21/16

## 2016-07-21 NOTE — Patient Instructions (Signed)
   We are going to try you on the Symbicort 80/4.5 in place of your current inhaler.  Let me know if you feel if your breathing is still doing ok and your weight is decreasing because then we will switch over your prescription. Otherwise, go back to the Symbicort 160/4.5.  Call me if you have any new breathing problems before your next appointment.   TESTS ORDERED: 1. Spirometry with bronchodilator challenge

## 2016-07-21 NOTE — Addendum Note (Signed)
Addended by: Tyson Dense on: 07/21/2016 11:18 AM   Modules accepted: Orders

## 2016-08-02 ENCOUNTER — Telehealth: Payer: Self-pay | Admitting: Pulmonary Disease

## 2016-08-02 ENCOUNTER — Telehealth: Payer: Self-pay

## 2016-08-02 ENCOUNTER — Other Ambulatory Visit: Payer: Self-pay | Admitting: Family Medicine

## 2016-08-02 MED ORDER — BUDESONIDE-FORMOTEROL FUMARATE 80-4.5 MCG/ACT IN AERO: 2.0000 | INHALATION_SPRAY | Freq: Two times a day (BID) | RESPIRATORY_TRACT | 0 refills | Status: DC

## 2016-08-02 NOTE — Telephone Encounter (Signed)
Please disregard this telephone message.

## 2016-08-02 NOTE — Telephone Encounter (Signed)
Samples have been left up front for pick up. Pt is aware. Nothing further was needed. 

## 2016-09-01 ENCOUNTER — Other Ambulatory Visit: Payer: Self-pay | Admitting: Family Medicine

## 2016-09-04 ENCOUNTER — Telehealth: Payer: Self-pay | Admitting: Family Medicine

## 2016-09-04 MED ORDER — HYDROCHLOROTHIAZIDE 12.5 MG PO TABS: 25.0000 mg | ORAL_TABLET | Freq: Every day | ORAL | 0 refills | Status: DC

## 2016-09-04 NOTE — Telephone Encounter (Signed)
Pt called requesting medication refill on hydrochlorothiazide (HYDRODIURIL) 12.5 MG tablet Pt states she will be completely out tomorrow. Please f/up. Pt is scheduled 07/27

## 2016-09-04 NOTE — Telephone Encounter (Signed)
Refilled x 7 days to last until appt with PCP

## 2016-09-08 ENCOUNTER — Encounter: Payer: Self-pay | Admitting: Family Medicine

## 2016-09-08 ENCOUNTER — Ambulatory Visit: Payer: Self-pay | Attending: Family Medicine | Admitting: Family Medicine

## 2016-09-08 VITALS — BP 103/70 | HR 62 | Temp 98.3°F | Resp 18 | Ht 64.0 in | Wt 158.4 lb

## 2016-09-08 DIAGNOSIS — J449 Chronic obstructive pulmonary disease, unspecified: Secondary | ICD-10-CM | POA: Insufficient documentation

## 2016-09-08 DIAGNOSIS — Z1239 Encounter for other screening for malignant neoplasm of breast: Secondary | ICD-10-CM

## 2016-09-08 DIAGNOSIS — I10 Essential (primary) hypertension: Secondary | ICD-10-CM | POA: Insufficient documentation

## 2016-09-08 DIAGNOSIS — Z0000 Encounter for general adult medical examination without abnormal findings: Secondary | ICD-10-CM

## 2016-09-08 DIAGNOSIS — Z79899 Other long term (current) drug therapy: Secondary | ICD-10-CM | POA: Insufficient documentation

## 2016-09-08 DIAGNOSIS — Z1231 Encounter for screening mammogram for malignant neoplasm of breast: Secondary | ICD-10-CM

## 2016-09-08 DIAGNOSIS — H6123 Impacted cerumen, bilateral: Secondary | ICD-10-CM

## 2016-09-08 DIAGNOSIS — R0981 Nasal congestion: Secondary | ICD-10-CM | POA: Insufficient documentation

## 2016-09-08 MED ORDER — FLUTICASONE PROPIONATE 50 MCG/ACT NA SUSP: 2.0000 | Freq: Every day | NASAL | 6 refills | Status: DC | PRN

## 2016-09-08 MED ORDER — CARBAMIDE PEROXIDE 6.5 % OT SOLN: 10.0000 [drp] | Freq: Two times a day (BID) | OTIC | 0 refills | Status: DC

## 2016-09-08 MED ORDER — ALBUTEROL SULFATE HFA 108 (90 BASE) MCG/ACT IN AERS: 2.0000 | INHALATION_SPRAY | Freq: Four times a day (QID) | RESPIRATORY_TRACT | 2 refills | Status: DC | PRN

## 2016-09-08 MED ORDER — HYDROCHLOROTHIAZIDE 25 MG PO TABS: 25.0000 mg | ORAL_TABLET | Freq: Every day | ORAL | 0 refills | Status: DC

## 2016-09-08 MED ORDER — LORATADINE 10 MG PO TABS: 10.0000 mg | ORAL_TABLET | Freq: Every day | ORAL | 6 refills | Status: DC

## 2016-09-08 NOTE — Patient Instructions (Signed)
DASH Eating Plan DASH stands for "Dietary Approaches to Stop Hypertension." The DASH eating plan is a healthy eating plan that has been shown to reduce high blood pressure (hypertension). It may also reduce your risk for type 2 diabetes, heart disease, and stroke. The DASH eating plan may also help with weight loss. What are tips for following this plan? General guidelines  Avoid eating more than 2,300 mg (milligrams) of salt (sodium) a day. If you have hypertension, you may need to reduce your sodium intake to 1,500 mg a day.  Limit alcohol intake to no more than 1 drink a day for nonpregnant women and 2 drinks a day for men. One drink equals 12 oz of beer, 5 oz of wine, or 1 oz of hard liquor.  Work with your health care provider to maintain a healthy body weight or to lose weight. Ask what an ideal weight is for you.  Get at least 30 minutes of exercise that causes your heart to beat faster (aerobic exercise) most days of the week. Activities may include walking, swimming, or biking.  Work with your health care provider or diet and nutrition specialist (dietitian) to adjust your eating plan to your individual calorie needs. Reading food labels  Check food labels for the amount of sodium per serving. Choose foods with less than 5 percent of the Daily Value of sodium. Generally, foods with less than 300 mg of sodium per serving fit into this eating plan.  To find whole grains, look for the word "whole" as the first word in the ingredient list. Shopping  Buy products labeled as "low-sodium" or "no salt added."  Buy fresh foods. Avoid canned foods and premade or frozen meals. Cooking  Avoid adding salt when cooking. Use salt-free seasonings or herbs instead of table salt or sea salt. Check with your health care provider or pharmacist before using salt substitutes.  Do not fry foods. Cook foods using healthy methods such as baking, boiling, grilling, and broiling instead.  Cook with  heart-healthy oils, such as olive, canola, soybean, or sunflower oil. Meal planning   Eat a balanced diet that includes: ? 5 or more servings of fruits and vegetables each day. At each meal, try to fill half of your plate with fruits and vegetables. ? Up to 6-8 servings of whole grains each day. ? Less than 6 oz of lean meat, poultry, or fish each day. A 3-oz serving of meat is about the same size as a deck of cards. One egg equals 1 oz. ? 2 servings of low-fat dairy each day. ? A serving of nuts, seeds, or beans 5 times each week. ? Heart-healthy fats. Healthy fats called Omega-3 fatty acids are found in foods such as flaxseeds and coldwater fish, like sardines, salmon, and mackerel.  Limit how much you eat of the following: ? Canned or prepackaged foods. ? Food that is high in trans fat, such as fried foods. ? Food that is high in saturated fat, such as fatty meat. ? Sweets, desserts, sugary drinks, and other foods with added sugar. ? Full-fat dairy products.  Do not salt foods before eating.  Try to eat at least 2 vegetarian meals each week.  Eat more home-cooked food and less restaurant, buffet, and fast food.  When eating at a restaurant, ask that your food be prepared with less salt or no salt, if possible. What foods are recommended? The items listed may not be a complete list. Talk with your dietitian about what   dietary choices are best for you. Grains Whole-grain or whole-wheat bread. Whole-grain or whole-wheat pasta. Brown rice. Oatmeal. Quinoa. Bulgur. Whole-grain and low-sodium cereals. Pita bread. Low-fat, low-sodium crackers. Whole-wheat flour tortillas. Vegetables Fresh or frozen vegetables (raw, steamed, roasted, or grilled). Low-sodium or reduced-sodium tomato and vegetable juice. Low-sodium or reduced-sodium tomato sauce and tomato paste. Low-sodium or reduced-sodium canned vegetables. Fruits All fresh, dried, or frozen fruit. Canned fruit in natural juice (without  added sugar). Meat and other protein foods Skinless chicken or turkey. Ground chicken or turkey. Pork with fat trimmed off. Fish and seafood. Egg whites. Dried beans, peas, or lentils. Unsalted nuts, nut butters, and seeds. Unsalted canned beans. Lean cuts of beef with fat trimmed off. Low-sodium, lean deli meat. Dairy Low-fat (1%) or fat-free (skim) milk. Fat-free, low-fat, or reduced-fat cheeses. Nonfat, low-sodium ricotta or cottage cheese. Low-fat or nonfat yogurt. Low-fat, low-sodium cheese. Fats and oils Soft margarine without trans fats. Vegetable oil. Low-fat, reduced-fat, or light mayonnaise and salad dressings (reduced-sodium). Canola, safflower, olive, soybean, and sunflower oils. Avocado. Seasoning and other foods Herbs. Spices. Seasoning mixes without salt. Unsalted popcorn and pretzels. Fat-free sweets. What foods are not recommended? The items listed may not be a complete list. Talk with your dietitian about what dietary choices are best for you. Grains Baked goods made with fat, such as croissants, muffins, or some breads. Dry pasta or rice meal packs. Vegetables Creamed or fried vegetables. Vegetables in a cheese sauce. Regular canned vegetables (not low-sodium or reduced-sodium). Regular canned tomato sauce and paste (not low-sodium or reduced-sodium). Regular tomato and vegetable juice (not low-sodium or reduced-sodium). Pickles. Olives. Fruits Canned fruit in a light or heavy syrup. Fried fruit. Fruit in cream or butter sauce. Meat and other protein foods Fatty cuts of meat. Ribs. Fried meat. Bacon. Sausage. Bologna and other processed lunch meats. Salami. Fatback. Hotdogs. Bratwurst. Salted nuts and seeds. Canned beans with added salt. Canned or smoked fish. Whole eggs or egg yolks. Chicken or turkey with skin. Dairy Whole or 2% milk, cream, and half-and-half. Whole or full-fat cream cheese. Whole-fat or sweetened yogurt. Full-fat cheese. Nondairy creamers. Whipped toppings.  Processed cheese and cheese spreads. Fats and oils Butter. Stick margarine. Lard. Shortening. Ghee. Bacon fat. Tropical oils, such as coconut, palm kernel, or palm oil. Seasoning and other foods Salted popcorn and pretzels. Onion salt, garlic salt, seasoned salt, table salt, and sea salt. Worcestershire sauce. Tartar sauce. Barbecue sauce. Teriyaki sauce. Soy sauce, including reduced-sodium. Steak sauce. Canned and packaged gravies. Fish sauce. Oyster sauce. Cocktail sauce. Horseradish that you find on the shelf. Ketchup. Mustard. Meat flavorings and tenderizers. Bouillon cubes. Hot sauce and Tabasco sauce. Premade or packaged marinades. Premade or packaged taco seasonings. Relishes. Regular salad dressings. Where to find more information:  National Heart, Lung, and Blood Institute: www.nhlbi.nih.gov  American Heart Association: www.heart.org Summary  The DASH eating plan is a healthy eating plan that has been shown to reduce high blood pressure (hypertension). It may also reduce your risk for type 2 diabetes, heart disease, and stroke.  With the DASH eating plan, you should limit salt (sodium) intake to 2,300 mg a day. If you have hypertension, you may need to reduce your sodium intake to 1,500 mg a day.  When on the DASH eating plan, aim to eat more fresh fruits and vegetables, whole grains, lean proteins, low-fat dairy, and heart-healthy fats.  Work with your health care provider or diet and nutrition specialist (dietitian) to adjust your eating plan to your individual   calorie needs. This information is not intended to replace advice given to you by your health care provider. Make sure you discuss any questions you have with your health care provider. Document Released: 01/19/2011 Document Revised: 01/24/2016 Document Reviewed: 01/24/2016 Elsevier Interactive Patient Education  2017 Elsevier Inc.  

## 2016-09-08 NOTE — Progress Notes (Signed)
Subjective:  Patient ID: Sarah Cruz, female    DOB: Mar 15, 1965  Age: 51 y.o. MRN: [id number]  CC: Hypertension   HPI Sarah Cruz presents for hypertension follow up . She is not exercising and is adherent to low salt diet.  Blood pressure is well controlled at home. Cardiac symptoms none. Patient denies chest pain, chest pressure/discomfort, claudication, dyspnea, lower extremity edema, near-syncope, palpitations and syncope.  Cardiovascular risk factors: hypertension and sedentary lifestyle. Use of agents associated with hypertension: none. History of target organ damage: none. History of COPD. She is not currently in exacerbation. Patient currently is not on home oxygen therapy.Marland Kitchen Respiratory history: asthma and COPD. She complaints of  nasal congestion. She denies fevers, recent URI, or cough. The patient has been suffering from these symptoms for approximately several weeks. The patient has tried nothing for symptoms . The patient has never had nasal polyps.  The patient does not suffer from frequent sinopulmonary infections. The patient has not had sinus surgery in the past.  Outpatient Medications Prior to Visit  Medication Sig Dispense Refill  . acetaminophen (TYLENOL) 500 MG tablet Take 1,000 mg by mouth 2 (two) times daily as needed for mild pain or headache.    Marland Kitchen amoxicillin-clavulanate (AUGMENTIN) 875-125 MG tablet Take 1 tablet by mouth 2 (two) times daily. 14 tablet 0  . budesonide-formoterol (SYMBICORT) 80-4.5 MCG/ACT inhaler Inhale 2 puffs into the lungs 2 (two) times daily. 2 Inhaler 0  . carbamide peroxide (DEBROX) 6.5 % otic solution Place 10 drops into both ears 2 (two) times daily. 15 mL 0  . hydrochlorothiazide (HYDRODIURIL) 12.5 MG tablet Take 2 tablets (25 mg total) by mouth daily. 14 tablet 0   No facility-administered medications prior to visit.     ROS Review of Systems  Constitutional: Negative.   HENT: Positive for congestion.   Eyes: Negative.     Respiratory: Negative.   Cardiovascular: Negative.   Gastrointestinal: Negative.   Skin: Negative.    Objective:  BP 103/70 (BP Location: Left Arm, Patient Position: Sitting, Cuff Size: Normal)   Pulse 62   Temp 98.3 F (36.8 C) (Oral)   Resp 18   Ht 5\' 4"  (1.626 m)   Wt 158 lb 6.4 oz (71.8 kg)   SpO2 100%   BMI 27.19 kg/m   BP/Weight 09/08/2016 07/21/2016 2/53/6644  Systolic BP 034 742 595  Diastolic BP 70 70 68  Wt. (Lbs) 158.4 155.8 147.2  BMI 27.19 26.74 25.27     Physical Exam  Constitutional: She appears well-developed and well-nourished.  HENT:  Head: Normocephalic and atraumatic.  Right Ear: External ear normal.  Left Ear: External ear normal.  Nose: Nose normal.  Eyes: Pupils are equal, round, and reactive to light. Conjunctivae are normal.  Neck: No JVD present.  Cardiovascular: Normal rate, regular rhythm, normal heart sounds and intact distal pulses.   Pulmonary/Chest: Effort normal and breath sounds normal. She has no wheezes.  Abdominal: Soft. Bowel sounds are normal.  Skin: Skin is warm and dry.  Nursing note and vitals reviewed.   Assessment & Plan:   Problem List Items Addressed This Visit      Cardiovascular and Mediastinum   Essential hypertension - Primary    Other Visit Diagnoses    Chronic obstructive pulmonary disease, unspecified COPD type ([place])       Relevant Medications   fluticasone (FLONASE) 50 MCG/ACT nasal spray   loratadine (CLARITIN) 10 MG tablet   albuterol (PROVENTIL HFA;VENTOLIN HFA) 108 (90  Base) MCG/ACT inhaler   Excessive cerumen in both ear canals       Relevant Medications   carbamide peroxide (DEBROX) 6.5 % OTIC solution   Chronic nasal congestion       Relevant Medications   fluticasone (FLONASE) 50 MCG/ACT nasal spray   loratadine (CLARITIN) 10 MG tablet   Screening for breast cancer       Relevant Orders   MM SCREENING BREAST TOMO BILATERAL   Healthcare maintenance       Relevant Orders   HIV antibody (with  reflex) (Completed)   Ambulatory referral to Gastroenterology      Meds ordered this encounter  Medications  . DISCONTD: hydrochlorothiazide (HYDRODIURIL) 25 MG tablet    Sig: Take 1 tablet (25 mg total) by mouth daily.    Dispense:  90 tablet    Refill:  0    Must have office visit for refills    Order Specific Question:   Supervising Provider    Answer:   Tresa Garter [id number]  . carbamide peroxide (DEBROX) 6.5 % OTIC solution    Sig: Place 10 drops into both ears 2 (two) times daily.    Dispense:  15 mL    Refill:  0    Order Specific Question:   Supervising Provider    Answer:   Tresa Garter [id number]  . fluticasone (FLONASE) 50 MCG/ACT nasal spray    Sig: Place 2 sprays into both nostrils daily as needed for allergies or rhinitis.    Dispense:  16 g    Refill:  6    Order Specific Question:   Supervising Provider    Answer:   Tresa Garter [id number]  . loratadine (CLARITIN) 10 MG tablet    Sig: Take 1 tablet (10 mg total) by mouth daily.    Dispense:  30 tablet    Refill:  6    Order Specific Question:   Supervising Provider    Answer:   Tresa Garter [id number]  . DISCONTD: albuterol (PROVENTIL HFA;VENTOLIN HFA) 108 (90 Base) MCG/ACT inhaler    Sig: Inhale 2 puffs into the lungs every 6 (six) hours as needed for wheezing or shortness of breath.    Dispense:  1 Inhaler    Refill:  2    Order Specific Question:   Supervising Provider    Answer:   Tresa Garter [id number]  . albuterol (PROVENTIL HFA;VENTOLIN HFA) 108 (90 Base) MCG/ACT inhaler    Sig: Inhale 2 puffs into the lungs every 6 (six) hours as needed for wheezing or shortness of breath.    Dispense:  1 Inhaler    Refill:  2    Order Specific Question:   Supervising Provider    Answer:   Tresa Garter [id number]    Follow-up: Return in about 3 months (around 12/09/2016) for HTN.   Alfonse Spruce FNP

## 2016-09-08 NOTE — Progress Notes (Signed)
Patient is here for f/up HTN   Patient complain about sinus congestion

## 2016-09-09 LAB — HIV ANTIBODY (ROUTINE TESTING W REFLEX): HIV SCREEN 4TH GENERATION: NONREACTIVE

## 2016-09-13 ENCOUNTER — Telehealth: Payer: Self-pay | Admitting: Family Medicine

## 2016-09-13 NOTE — Telephone Encounter (Signed)
She was supposed to be taking 12.5 mg 2 capsules to make 25 mg but it should be the same dose. Will forward to PCP since encounter isn't completed to review

## 2016-09-13 NOTE — Telephone Encounter (Signed)
Dosage is the same. She will take one pill of hydrochlorothiazide instead of two.

## 2016-09-13 NOTE — Telephone Encounter (Signed)
Patient did not answer student RMA left a voice mail for the patient to call back at [phone]

## 2016-09-13 NOTE — Telephone Encounter (Signed)
Pt called requesting medication refill on hydrochlorothiazide (HYDRODIURIL) 25 MG tablet   pt states dosage is wrong , she was taking 12.5mg  instead of 25. Please f/up

## 2016-09-14 ENCOUNTER — Other Ambulatory Visit: Payer: Self-pay | Admitting: *Deleted

## 2016-09-14 ENCOUNTER — Other Ambulatory Visit: Payer: Self-pay | Admitting: Family Medicine

## 2016-09-14 DIAGNOSIS — I10 Essential (primary) hypertension: Secondary | ICD-10-CM

## 2016-09-14 MED ORDER — HYDROCHLOROTHIAZIDE 25 MG PO TABS: 25.0000 mg | ORAL_TABLET | Freq: Every day | ORAL | 0 refills | Status: DC

## 2016-09-14 NOTE — Telephone Encounter (Signed)
Patient verified DOB A refill script was sent to the pharmacy for patients HCTZ. Patient is aware of how the dosing is the same, just provided in one capsule instead of two. No further questions.

## 2016-09-18 ENCOUNTER — Telehealth: Payer: Self-pay

## 2016-09-18 NOTE — Telephone Encounter (Signed)
CMA call regarding ab results  Patient verify DOB  Patient was aware and understood

## 2016-09-18 NOTE — Telephone Encounter (Signed)
-----   Message from Alfonse Spruce, [place] sent at 09/18/2016  8:43 AM EDT ----- HIV is negative.

## 2016-11-02 ENCOUNTER — Ambulatory Visit (INDEPENDENT_AMBULATORY_CARE_PROVIDER_SITE_OTHER): Payer: Self-pay | Admitting: Pulmonary Disease

## 2016-11-02 ENCOUNTER — Encounter: Payer: Self-pay | Admitting: Pulmonary Disease

## 2016-11-02 VITALS — BP 136/76 | HR 61 | Ht 64.5 in | Wt 158.0 lb

## 2016-11-02 DIAGNOSIS — D869 Sarcoidosis, unspecified: Secondary | ICD-10-CM

## 2016-11-02 DIAGNOSIS — J449 Chronic obstructive pulmonary disease, unspecified: Secondary | ICD-10-CM

## 2016-11-02 DIAGNOSIS — F17200 Nicotine dependence, unspecified, uncomplicated: Secondary | ICD-10-CM

## 2016-11-02 LAB — PULMONARY FUNCTION TEST
FEF 25-75 Post: 1.09 L/sec
FEF 25-75 Pre: 0.52 L/sec
FEF2575-%Change-Post: 109 %
FEF2575-%Pred-Post: 44 %
FEF2575-%Pred-Pre: 21 %
FEV1-%CHANGE-POST: 31 %
FEV1-%PRED-PRE: 47 %
FEV1-%Pred-Post: 62 %
FEV1-POST: 1.46 L
FEV1-PRE: 1.11 L
FEV1FVC-%Change-Post: 9 %
FEV1FVC-%Pred-Pre: 68 %
FEV6-%Change-Post: 18 %
FEV6-%PRED-POST: 83 %
FEV6-%PRED-PRE: 69 %
FEV6-POST: 2.36 L
FEV6-Pre: 1.99 L
FEV6FVC-%CHANGE-POST: -1 %
FEV6FVC-%PRED-POST: 100 %
FEV6FVC-%Pred-Pre: 101 %
FVC-%CHANGE-POST: 20 %
FVC-%PRED-POST: 82 %
FVC-%PRED-PRE: 68 %
FVC-POST: 2.41 L
FVC-PRE: 2.01 L
POST FEV6/FVC RATIO: 98 %
PRE FEV6/FVC RATIO: 99 %
Post FEV1/FVC ratio: 60 %
Pre FEV1/FVC ratio: 55 %

## 2016-11-02 MED ORDER — BUDESONIDE-FORMOTEROL FUMARATE 160-4.5 MCG/ACT IN AERO: 2.0000 | INHALATION_SPRAY | Freq: Every day | RESPIRATORY_TRACT | 6 refills | Status: DC

## 2016-11-02 MED ORDER — ALBUTEROL SULFATE HFA 108 (90 BASE) MCG/ACT IN AERS: 2.0000 | INHALATION_SPRAY | Freq: Four times a day (QID) | RESPIRATORY_TRACT | 2 refills | Status: DC | PRN

## 2016-11-02 MED ORDER — BUDESONIDE-FORMOTEROL FUMARATE 160-4.5 MCG/ACT IN AERO: 2.0000 | INHALATION_SPRAY | Freq: Every day | RESPIRATORY_TRACT | 0 refills | Status: DC

## 2016-11-02 NOTE — Patient Instructions (Addendum)
   Continue working on cutting back on your cigarettes.  As soon as you can get your Symbicort and Ventolin prescriptions filled.  Use the Symbicort samples we are giving you today by doing 2 puffs twice daily.  Remember to remove any dentures or partials you have before you use your inhaler. Remember to brush your teeth & tongue after you use your inhaler as well as rinse, gargle & spit to keep from getting thrush in your mouth or on your tongue (a white film).   We will see you back in 6 months or sooner if needed. Call if you need anything or have questions.

## 2016-11-02 NOTE — Progress Notes (Signed)
Subjective:    Patient ID: Sarah Cruz, female    DOB: [birth year], 51 y.o.   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  HPI  Severe COPD: Symbicort tapered to 80/4.5 at last appointment. She admits that after walking up 2 flights of stairs she has to stop due to dyspnea. She reports no coughing. She reports she does notice wheezing if she misses her Symbicort. She reports she has not be using her Symbicort twice daily trying to get it to last until her insurance takes effects. She hasn't been able to afford an emergency inhaler yet either.   Sarcoidosis: Primary lung involvement. Predominantly only on inhaled corticosteroid therapy. No rashes or bruising. She reports she does have occasional erythema in her eyes but no eye pain or blurry vision. She reports she had transient palpitations but no syncope or near syncope.   Tobacco use disorder: Patient's father passed within the last year of lung cancer. Patient was still smoking 10 cigarettes per day at last appointment. She reports she is back up to a pack per day. She cites personal issues that keep her from quitting.   History of TB exposure: Skin PPD negative & QuantiFERON-TB negative.   Review of Systems She reports an occasional sensitivity in her left chest under her armpit. She denies any other chest pain or pressure. No pleuritic chest pain. She is having more sinus congestion & drainage but is unable to afford her medication. No new numbness or tingling.   No Known Allergies  Current Outpatient Prescriptions on File Prior to Visit  Medication Sig Dispense Refill  . acetaminophen (TYLENOL) 500 MG tablet Take 1,000 mg by mouth 2 (two) times daily as needed for mild pain or headache.    . albuterol (PROVENTIL HFA;VENTOLIN HFA) 108 (90 Base) MCG/ACT inhaler Inhale 2 puffs into the lungs every 6 (six) hours as needed for wheezing or shortness of breath. 1 Inhaler 2  .  budesonide-formoterol (SYMBICORT) 80-4.5 MCG/ACT inhaler Inhale 2 puffs into the lungs 2 (two) times daily. 2 Inhaler 0  . carbamide peroxide (DEBROX) 6.5 % OTIC solution Place 10 drops into both ears 2 (two) times daily. 15 mL 0  . fluticasone (FLONASE) 50 MCG/ACT nasal spray Place 2 sprays into both nostrils daily as needed for allergies or rhinitis. 16 g 6  . hydrochlorothiazide (HYDRODIURIL) 25 MG tablet Take 1 tablet (25 mg total) by mouth daily. 90 tablet 0   No current facility-administered medications on file prior to visit.     Past Medical History:  Diagnosis Date  . Childhood asthma   . Hypertension     Past Surgical History:  Procedure Laterality Date  . ABDOMINAL HYSTERECTOMY    . ENDOBRONCHIAL ULTRASOUND Bilateral 12/07/2015   Procedure: ENDOBRONCHIAL ULTRASOUND;  Surgeon: Javier Glazier, MD;  Location: [facility];  Service: Cardiopulmonary;  Laterality: Bilateral;    Family History  Problem Relation Age of Onset  . Heart failure Mother   . Diabetes Mother   . Hypertension Mother   . Arthritis Mother   . Brain cancer Sister   . Sickle cell anemia Other   . Brain cancer Other   . Asthma Other   . Rheumatologic disease Neg Hx     Social History   Social History  . Marital status: Divorced    Spouse name: N/A  . Number of children: 3  . Years of education: N/A   Social History  Main Topics  . Smoking status: Current Every Day Smoker    Packs/day: 1.50    Years: 38.00    Types: Cigarettes    Start date: 04/08/1977  . Smokeless tobacco: Never Used     Comment: down to 0.5ppd  . Alcohol use No     Comment: Remote EtOH  . Drug use: No  . Sexual activity: Not on file   Other Topics Concern  . Not on file   Social History Narrative   Originally from [place], [place]. Moved to [facility] in November 2016. Prior travel to [place]. No travel outside the [place]. 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 No bird, mold, or hot tub exposure. No  history of volunteer work or residence in a homeless shelter. No history of incarceration. Does have prior exposure to a nephew with TB but has always had a negative PPD skin test.       Objective:   Physical Exam BP 136/76 (BP Location: Left Arm, Cuff Size: Normal)   Pulse 61   Ht 5' 4.5" (1.638 m)   Wt 158 lb (71.7 kg)   SpO2 97%   BMI 26.70 kg/m   General:  No distress. Alert. Awake. Integument:  Warm & dry. No rash on exposed skin. No bruising on exposed skin. Extremities:  No cyanosis or clubbing.  HEENT:  Minimal nasal turbinate swelling. No oral ulcers. Moist mucous membranes. Cardiovascular:  Regular rate and rhythm. No edema. Pulmonary:  Slightly diminished breath sounds in the bases bilaterally. Otherwise clear with auscultation. Normal work of breathing on room air. Abdomen: Soft. Normal bowel sounds. Nondistended.  Musculoskeletal:  Normal bulk and tone. No joint deformity or effusion appreciated.   PFT 11/02/16: FVC 2.01 L (68%) FEV1 1.11 L (47%) FEV1/FVC 0.55 FEF 25-75 0.52 L (21%) positive bronchodilator response 04/05/16: FVC 2.40 L (81%) FEV1 1.41 L (60%) FEV1/FVC 0.59 FEF 25-75 0.65 L (26%) negative bronchodilator response   TLC 4.54 L (80%) RV 119% ERV 117% DLCO corrected 61% 11/12/15: FVC 1.81 L (61%) FEV1 1.05 L (44%) FEV1/FVC 0.58 FEF 25-75 0.54 L (21%) positive bronchodilator response    TLC 5.29 L (103%) RV 187% ERV 75% DLC uncorrected 58%  6MWT 04/05/16:  Walked 437 meters / Baseline Sat 100% on RA / Nadir Sat 99% on RA @ end of test 11/19/15:  Walked 444 meters / Baseline Sat 99% on RA / Nadir Sat 98% on RA @ end of test  IMAGING CT CHEST W/ 10/16/15 (previously reviewed by me): Bilateral parenchymal nodules and opacities. There is some mild cavitation in the nodules of the upper lobes suggested. No pleural effusion or thickening. Prominent axillary lymph nodes. Bilateral hilar lymphadenopathy measuring up to 1.8 cm as well as mediastinal adenopathy with  subcarinal lymph node measuring approximately 2.2 cm in short axis. No pericardial effusion.  EKG 10/16/15 (previously reviewed by me): QTC 411 milliseconds. Normal sinus rhythm. No sign of conduction delay or suggestion of ischemia.  PATHOLOGY Right Lower Lobe Transbronchial Biopsy (12/07/15):  Benign Lung Tissue / No Malignancy EBUS FNA Level 7 (12/07/15):  Benign reactive/reparative changes suggesting granulomatous inflammation.   MICROBIOLOGY Quantiferon TB (11/19/15):  Negative  Skin PPD (10/25/15):  Negative Urine Histoplasma Ag (10/22/15):  Negative Urine Blastomyces Ag (10/22/15):  Negative   LABS On/15/17 Alpha-1 antitrypsin: MM (151)  10/22/15 LFT: 4.6/8.2/0.3/68/15/13 CRP: 0.2 ESR: 28 LDH: 138 ACE:  63 ANCA Screen:  Negative  ANA:  Negative Anti-Jo1:  <0.2 Centrometere Ab Screen:  <  0.2 DS DNA Ab:  1 RNP Ab:  0.3 Smith Ab:  <0.2 Chromatin Ab:  <0.2 RF:  <10 Anti-CCP:  <16 SSA:  <0.2 SSB:  0.4 SCL-70:  <0.2  10/16/15 Troponin I: 0.01 CBC: 4.4/12.0/39.3/316 BMP: 137/4.3/104/25/11/0.78/92/9.5 D-dimer:  <0.27    Assessment & Plan:  51 y.o. female with underlying sarcoidosis as well as severe COPD. Patient's spirometry today does demonstrate continued severe airways obstruction but also a significant bronchodilator response upon my review.This is likely due to her inability to afford her inhaler medicines and lack of consistent use. Overall her sarcoidosis does not appear to be active and I suspect what I am seeing is more a result of her chronic and ongoing tobacco use disorder. She does have significant environmental stressors that are preventing her tobacco cessation. We did speak at length regarding her potential for worsening pulmonary function with chronic tobacco use, especially in the setting of her pulmonary sarcoidosis. I instructed the patient to contact our office if she had any new breathing problems or questions before her next appointment.  1. Severe COPD:  Patient given samples of Symbicort 160/4.5 as well as instructions on use and proper oral hygiene. Also given printed prescriptions of Symbicort and Ventolin inhalers. 2. Sarcoidosis: Continuing to monitor with pulmonary function testing, yearly blood work, yearly EKG, and yearly ophthalmologic exam. Holding on immunosuppression. 3. Tobacco use disorder: Patient counseled for over 3 minutes on the need for complete tobacco cessation. 4. Health maintenance: Previously patient has declined immunizations. 5. Follow-up: Return to clinic in 6 months or sooner if needed.  Sonia Baller Ashok Cordia, M.D. [facility] Pager:  [phone] After 3pm or if no response, call [phone] 10:12 AM 11/02/16

## 2016-11-02 NOTE — Progress Notes (Signed)
Spirometry pre and post done today. 

## 2016-12-09 ENCOUNTER — Other Ambulatory Visit: Payer: Self-pay | Admitting: Family Medicine

## 2016-12-11 ENCOUNTER — Encounter: Payer: Self-pay | Admitting: Family Medicine

## 2016-12-11 ENCOUNTER — Ambulatory Visit: Payer: 59 | Attending: Family Medicine | Admitting: Family Medicine

## 2016-12-11 VITALS — BP 100/65 | HR 67 | Temp 98.2°F | Resp 18 | Ht 64.0 in | Wt 161.8 lb

## 2016-12-11 DIAGNOSIS — R202 Paresthesia of skin: Secondary | ICD-10-CM | POA: Insufficient documentation

## 2016-12-11 DIAGNOSIS — R200 Anesthesia of skin: Secondary | ICD-10-CM

## 2016-12-11 DIAGNOSIS — R51 Headache: Secondary | ICD-10-CM | POA: Diagnosis not present

## 2016-12-11 DIAGNOSIS — I10 Essential (primary) hypertension: Secondary | ICD-10-CM | POA: Diagnosis not present

## 2016-12-11 DIAGNOSIS — G44209 Tension-type headache, unspecified, not intractable: Secondary | ICD-10-CM | POA: Diagnosis not present

## 2016-12-11 DIAGNOSIS — Z79899 Other long term (current) drug therapy: Secondary | ICD-10-CM | POA: Diagnosis not present

## 2016-12-11 DIAGNOSIS — M25531 Pain in right wrist: Secondary | ICD-10-CM | POA: Insufficient documentation

## 2016-12-11 MED ORDER — IBUPROFEN 600 MG PO TABS: 600.0000 mg | ORAL_TABLET | Freq: Three times a day (TID) | ORAL | 1 refills | Status: DC | PRN

## 2016-12-11 MED ORDER — HYDROCHLOROTHIAZIDE 25 MG PO TABS: 25.0000 mg | ORAL_TABLET | Freq: Every day | ORAL | 3 refills | Status: DC

## 2016-12-11 MED FILL — HYDROCHLOROTHIAZIDE 25 MG T: 25 | 30 days supply | Qty: 30 | Fill #0

## 2016-12-11 MED FILL — IBUPROFEN 600 MG TABS: 600 | 10 days supply | Qty: 30 | Fill #0

## 2016-12-11 NOTE — Progress Notes (Signed)
Patient is here for HTN   Patient needs emergency supplies  until Wednesday   Patient complains headaches in the morning

## 2016-12-11 NOTE — Progress Notes (Signed)
Subjective:  Patient ID: Sarah Cruz, female    DOB: May 03, 1965  Age: 51 y.o. MRN: [id number]  CC: Hypertension   HPI Breland L Aplin presents for hypertension follow up . She is not exercising and is adherent to low salt diet.  Blood pressure is well controlled at home. Cardiac symptoms none. Patient denies chest pain, chest pressure/discomfort, claudication, dyspnea, lower extremity edema, near-syncope, palpitations and syncope.  Cardiovascular risk factors: hypertension and sedentary lifestyle. Use of agents associated with hypertension: none. History of target organ damage: none. She complaints of intermitment headaches. Denies any vision changes or difficulty keeping her balance. She does report recent stressors including recent moving and decreased sleep. She complains of arthralgias for which has been present for 1 month. Pain is located in the right wrist(s), is described as aching, and is intermittent .  Associated symptoms include: decreased grip and occasional episodes of numbness and tingling.  The patient has tried tylenol  for pain relief and wrist wrap.  Related to injury:   No. She reports her occupation requires working with her hands. 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   Outpatient Medications Prior to Visit  Medication Sig Dispense Refill  . budesonide-formoterol (SYMBICORT) 160-4.5 MCG/ACT inhaler Inhale 2 puffs into the lungs daily. 2 Inhaler 0  . hydrochlorothiazide (HYDRODIURIL) 25 MG tablet Take 1 tablet (25 mg total) by mouth daily. 90 tablet 0  . acetaminophen (TYLENOL) 500 MG tablet Take 1,000 mg by mouth 2 (two) times daily as needed for mild pain or headache.    . albuterol (PROVENTIL HFA;VENTOLIN HFA) 108 (90 Base) MCG/ACT inhaler Inhale 2 puffs into the lungs every 6 (six) hours as needed for wheezing or shortness of breath. 1 Inhaler 2  . budesonide-formoterol (SYMBICORT) 160-4.5 MCG/ACT inhaler Inhale 2 puffs into  the lungs daily. 1 Inhaler 6  . carbamide peroxide (DEBROX) 6.5 % OTIC solution Place 10 drops into both ears 2 (two) times daily. 15 mL 0  . fluticasone (FLONASE) 50 MCG/ACT nasal spray Place 2 sprays into both nostrils daily as needed for allergies or rhinitis. 16 g 6   No facility-administered medications prior to visit.     ROS Review of Systems  Constitutional: Negative.   Eyes: Negative.   Respiratory: Negative.   Cardiovascular: Negative.   Gastrointestinal: Negative.   Musculoskeletal: Positive for arthralgias.  Skin: Negative.   Neurological: Positive for numbness (numbness/tingling ).   Objective:  BP 100/65   Pulse 67   Temp 98.2 F (36.8 C) (Oral)   Resp 18   Ht 5\' 4"  (1.626 m)   Wt 161 lb 12.8 oz (73.4 kg)   SpO2 98%   BMI 27.77 kg/m   BP/Weight 12/11/2016 11/02/2016 04/28/1759  Systolic BP 607 371 062  Diastolic BP 65 76 70  Wt. (Lbs) 161.8 158 158.4  BMI 27.77 26.7 27.19     Physical Exam  Constitutional: She appears well-developed and well-nourished.  Cardiovascular: Normal rate, regular rhythm, normal heart sounds and intact distal pulses.   Pulmonary/Chest: Effort normal and breath sounds normal.  Musculoskeletal: Normal range of motion. She exhibits no tenderness.       Right hand: She exhibits normal range of motion and no tenderness. Normal sensation noted. Decreased strength (4/5 hand grip) noted.       Left hand: She exhibits normal range of motion and no tenderness. Normal sensation noted.  Skin: Skin is warm and dry.  Nursing note  and vitals reviewed.   Assessment & Plan:   1. Essential hypertension Continue current dosage of HTCZ Pt doesn't check BP at home, ortho done to evaluate for possibility of low BP. - hydrochlorothiazide (HYDRODIURIL) 25 MG tablet; Take 1 tablet (25 mg total) by mouth daily.  Dispense: 30 tablet; Refill: 3  2. Acute non intractable tension-type headache  - ibuprofen (ADVIL,MOTRIN) 600 MG tablet; Take 1 tablet  (600 mg total) by mouth every 8 (eight) hours as needed for headache, moderate pain or cramping (Take with food).  Dispense: 30 tablet; Refill: 1  3. Acute pain of right wrist -NSAID's/wrist brace , if symptoms do not improve or worsen recommend further workup.  - ibuprofen (ADVIL,MOTRIN) 600 MG tablet; Take 1 tablet (600 mg total) by mouth every 8 (eight) hours as needed for headache, moderate pain or cramping (Take with food).  Dispense: 30 tablet; Refill: 1 - Wrist brace  4. Numbness or tingling  - Wrist brace   Meds ordered this encounter  Medications  . hydrochlorothiazide (HYDRODIURIL) 25 MG tablet    Sig: Take 1 tablet (25 mg total) by mouth daily.    Dispense:  30 tablet    Refill:  3    Order Specific Question:   Supervising Provider    Answer:   Tresa Garter [id number]  . ibuprofen (ADVIL,MOTRIN) 600 MG tablet    Sig: Take 1 tablet (600 mg total) by mouth every 8 (eight) hours as needed for headache, moderate pain or cramping (Take with food).    Dispense:  30 tablet    Refill:  1    Order Specific Question:   Supervising Provider    Answer:   Tresa Garter [id number]    Follow-up: Return in about 3 months (around 03/13/2017), or if symptoms worsen or fail to improve, for HTN.   Alfonse Spruce FNP

## 2016-12-11 NOTE — Patient Instructions (Signed)
Wrist Pain, Adult There are many things that can cause wrist pain. Some common causes include:  An injury to the wrist area.  Overuse of the joint.  A condition that causes too much pressure to be put on a nerve in the wrist (carpal tunnel syndrome).  Wear and tear of the joints that happens as a person gets older (osteoarthritis).  Other types of arthritis.  Sometimes, the cause of wrist pain is not known. Often, the pain goes away when you follow your doctor's instructions for helping pain at home, such as resting or icing your wrist. If your wrist pain does not go away, it is important to tell your doctor. Follow these instructions at home:  Rest the wrist area for 48 hours or more, or as long as told by your doctor.  If a splint or elastic bandage has been put on your wrist, use it as told by your doctor. ? Take off the splint or bandage only as told by your doctor. ? Loosen the splint or bandage if your fingers tingle, lose feeling (get numb), or turn cold or blue.  If directed, apply ice to the injured area: ? If you have a removable splint or elastic bandage, remove it as told by your doctor. ? Put ice in a plastic bag. ? Place a towel between your skin and the bag or between your splint or bandage and the bag. ? Leave the ice on for 20 minutes, 2-3 times a day.  Keep your arm raised (elevated) above the level of your heart while you are sitting or lying down.  Take over-the-counter and prescription medicines only as told by your doctor.  Keep all follow-up visits as told by your doctor. This is important. Contact a doctor if:  You have a sudden sharp pain in the wrist, hand, or arm that is different or new.  The swelling or bruising on your wrist or hand gets worse.  Your skin becomes red, gets a rash, or has open sores.  Your pain does not get better or it gets worse. Get help right away if:  You lose feeling in your fingers or hand.  Your fingers turn white,  very red, or cold and blue.  You cannot move your fingers.  You have a fever or chills. This information is not intended to replace advice given to you by your health care provider. Make sure you discuss any questions you have with your health care provider. Document Released: 07/19/2007 Document Revised: 08/26/2015 Document Reviewed: 08/19/2015 Elsevier Interactive Patient Education  2017 [employer].

## 2017-01-09 MED FILL — HYDROCHLOROTHIAZIDE 25 MG T: 25 | 30 days supply | Qty: 30 | Fill #1

## 2017-01-11 MED FILL — !VENTOLIN HFA INHALER: 108 (90 BAS | 25 days supply | Qty: 18 | Fill #0

## 2017-02-08 MED FILL — HYDROCHLOROTHIAZIDE 25 MG T: 25 | 30 days supply | Qty: 30 | Fill #2

## 2017-03-07 MED FILL — HYDROCHLOROTHIAZIDE 25 MG T: 25 | 30 days supply | Qty: 30 | Fill #3

## 2017-03-13 ENCOUNTER — Ambulatory Visit: Payer: Self-pay | Admitting: Family Medicine

## 2017-03-13 ENCOUNTER — Ambulatory Visit: Payer: 59 | Attending: Family Medicine | Admitting: Family Medicine

## 2017-03-13 ENCOUNTER — Encounter: Payer: Self-pay | Admitting: Family Medicine

## 2017-03-13 VITALS — BP 129/78 | HR 72 | Temp 97.7°F | Resp 16 | Wt 165.6 lb

## 2017-03-13 DIAGNOSIS — Z13220 Encounter for screening for lipoid disorders: Secondary | ICD-10-CM | POA: Diagnosis not present

## 2017-03-13 DIAGNOSIS — Z7951 Long term (current) use of inhaled steroids: Secondary | ICD-10-CM | POA: Diagnosis not present

## 2017-03-13 DIAGNOSIS — Z79899 Other long term (current) drug therapy: Secondary | ICD-10-CM | POA: Diagnosis not present

## 2017-03-13 DIAGNOSIS — I10 Essential (primary) hypertension: Secondary | ICD-10-CM | POA: Diagnosis not present

## 2017-03-13 MED ORDER — HYDROCHLOROTHIAZIDE 25 MG PO TABS: 25.0000 mg | ORAL_TABLET | Freq: Every day | ORAL | 3 refills | Status: DC

## 2017-03-13 MED FILL — SYMBICORT 160-4.5 MCG INH: 160-4.5 | 30 days supply | Qty: 10 | Fill #0

## 2017-03-13 NOTE — Patient Instructions (Signed)
Schedule walk in visit for cholesterol screening. Must be fasting at least 4 hours prior.    Managing Your Hypertension Hypertension is commonly called high blood pressure. This is when the force of your blood pressing against the walls of your arteries is too strong. Arteries are blood vessels that carry blood from your heart throughout your body. Hypertension forces the heart to work harder to pump blood, and may cause the arteries to become narrow or stiff. Having untreated or uncontrolled hypertension can cause heart attack, stroke, kidney disease, and other problems. What are blood pressure readings? A blood pressure reading consists of a higher number over a lower number. Ideally, your blood pressure should be below 120/80. The first ("top") number is called the systolic pressure. It is a measure of the pressure in your arteries as your heart beats. The second ("bottom") number is called the diastolic pressure. It is a measure of the pressure in your arteries as the heart relaxes. What does my blood pressure reading mean? Blood pressure is classified into four stages. Based on your blood pressure reading, your health care provider may use the following stages to determine what type of treatment you need, if any. Systolic pressure and diastolic pressure are measured in a unit called mm Hg. Normal  Systolic pressure: below 425.  Diastolic pressure: below 80. Elevated  Systolic pressure: 956-387.  Diastolic pressure: below 80. Hypertension stage 1  Systolic pressure: 564-332.  Diastolic pressure: 95-18. Hypertension stage 2  Systolic pressure: 841 or above.  Diastolic pressure: 90 or above. What health risks are associated with hypertension? Managing your hypertension is an important responsibility. Uncontrolled hypertension can lead to:  A heart attack.  A stroke.  A weakened blood vessel (aneurysm).  Heart failure.  Kidney damage.  Eye damage.  Metabolic  syndrome.  Memory and concentration problems.  What changes can I make to manage my hypertension? Hypertension can be managed by making lifestyle changes and possibly by taking medicines. Your health care provider will help you make a plan to bring your blood pressure within a normal range. Eating and drinking  Eat a diet that is high in fiber and potassium, and low in salt (sodium), added sugar, and fat. An example eating plan is called the DASH (Dietary Approaches to Stop Hypertension) diet. To eat this way: ? Eat plenty of fresh fruits and vegetables. Try to fill half of your plate at each meal with fruits and vegetables. ? Eat whole grains, such as whole wheat pasta, brown rice, or whole grain bread. Fill about one quarter of your plate with whole grains. ? Eat low-fat diary products. ? Avoid fatty cuts of meat, processed or cured meats, and poultry with skin. Fill about one quarter of your plate with lean proteins such as fish, chicken without skin, beans, eggs, and tofu. ? Avoid premade and processed foods. These tend to be higher in sodium, added sugar, and fat.  Reduce your daily sodium intake. Most people with hypertension should eat less than 1,500 mg of sodium a day.  Limit alcohol intake to no more than 1 drink a day for nonpregnant women and 2 drinks a day for men. One drink equals 12 oz of beer, 5 oz of wine, or 1 oz of hard liquor. Lifestyle  Work with your health care provider to maintain a healthy body weight, or to lose weight. Ask what an ideal weight is for you.  Get at least 30 minutes of exercise that causes your heart to beat  faster (aerobic exercise) most days of the week. Activities may include walking, swimming, or biking.  Include exercise to strengthen your muscles (resistance exercise), such as weight lifting, as part of your weekly exercise routine. Try to do these types of exercises for 30 minutes at least 3 days a week.  Do not use any products that contain  nicotine or tobacco, such as cigarettes and e-cigarettes. If you need help quitting, ask your health care provider.  Control any long-term (chronic) conditions you have, such as high cholesterol or diabetes. Monitoring  Monitor your blood pressure at home as told by your health care provider. Your personal target blood pressure may vary depending on your medical conditions, your age, and other factors.  Have your blood pressure checked regularly, as often as told by your health care provider. Working with your health care provider  Review all the medicines you take with your health care provider because there may be side effects or interactions.  Talk with your health care provider about your diet, exercise habits, and other lifestyle factors that may be contributing to hypertension.  Visit your health care provider regularly. Your health care provider can help you create and adjust your plan for managing hypertension. Will I need medicine to control my blood pressure? Your health care provider may prescribe medicine if lifestyle changes are not enough to get your blood pressure under control, and if:  Your systolic blood pressure is 130 or higher.  Your diastolic blood pressure is 80 or higher.  Take medicines only as told by your health care provider. Follow the directions carefully. Blood pressure medicines must be taken as prescribed. The medicine does not work as well when you skip doses. Skipping doses also puts you at risk for problems. Contact a health care provider if:  You think you are having a reaction to medicines you have taken.  You have repeated (recurrent) headaches.  You feel dizzy.  You have swelling in your ankles.  You have trouble with your vision. Get help right away if:  You develop a severe headache or confusion.  You have unusual weakness or numbness, or you feel faint.  You have severe pain in your chest or abdomen.  You vomit repeatedly.  You  have trouble breathing. Summary  Hypertension is when the force of blood pumping through your arteries is too strong. If this condition is not controlled, it may put you at risk for serious complications.  Your personal target blood pressure may vary depending on your medical conditions, your age, and other factors. For most people, a normal blood pressure is less than 120/80.  Hypertension is managed by lifestyle changes, medicines, or both. Lifestyle changes include weight loss, eating a healthy, low-sodium diet, exercising more, and limiting alcohol. This information is not intended to replace advice given to you by your health care provider. Make sure you discuss any questions you have with your health care provider. Document Released: 10/25/2011 Document Revised: 12/29/2015 Document Reviewed: 12/29/2015 Elsevier Interactive Patient Education  [employer].

## 2017-03-13 NOTE — Progress Notes (Signed)
   Subjective:  Patient ID: Sarah Cruz, female    DOB: May 05, 1965  Age: 52 y.o. MRN: [id number]  CC: Hypertension   HPI Kleo L Bartz presents for hypertension follow up . She is not exercising and is adherent to low salt diet.  Blood pressure is well controlled at home. Cardiac symptoms none. Patient denies chest pain, chest pressure/discomfort, claudication, dyspnea, lower extremity edema, near-syncope, palpitations and syncope.  Cardiovascular risk factors: hypertension and sedentary lifestyle. Use of agents associated with hypertension: none. History of target organ damage: none.      Outpatient Medications Prior to Visit  Medication Sig Dispense Refill  . acetaminophen (TYLENOL) 500 MG tablet Take 1,000 mg by mouth 2 (two) times daily as needed for mild pain or headache.    . albuterol (PROVENTIL HFA;VENTOLIN HFA) 108 (90 Base) MCG/ACT inhaler Inhale 2 puffs into the lungs every 6 (six) hours as needed for wheezing or shortness of breath. 1 Inhaler 2  . budesonide-formoterol (SYMBICORT) 160-4.5 MCG/ACT inhaler Inhale 2 puffs into the lungs daily. 1 Inhaler 6  . budesonide-formoterol (SYMBICORT) 160-4.5 MCG/ACT inhaler Inhale 2 puffs into the lungs daily. 2 Inhaler 0  . carbamide peroxide (DEBROX) 6.5 % OTIC solution Place 10 drops into both ears 2 (two) times daily. 15 mL 0  . fluticasone (FLONASE) 50 MCG/ACT nasal spray Place 2 sprays into both nostrils daily as needed for allergies or rhinitis. 16 g 6  . ibuprofen (ADVIL,MOTRIN) 600 MG tablet Take 1 tablet (600 mg total) by mouth every 8 (eight) hours as needed for headache, moderate pain or cramping (Take with food). 30 tablet 1  . hydrochlorothiazide (HYDRODIURIL) 25 MG tablet Take 1 tablet (25 mg total) by mouth daily. 30 tablet 3   No facility-administered medications prior to visit.     ROS Review of Systems  Constitutional: Negative.   Eyes: Negative.   Respiratory: Negative.   Cardiovascular: Negative.     Gastrointestinal: Negative.   Skin: Negative.   Psychiatric/Behavioral: Negative.    Objective:  BP 129/78 (BP Location: Left Arm, Patient Position: Sitting, Cuff Size: Normal)   Pulse 72   Temp 97.7 F (36.5 C) (Oral)   Resp 16   Wt 165 lb 9.6 oz (75.1 kg)   SpO2 100%   BMI 28.43 kg/m   BP/Weight 03/13/2017 12/11/2016 6/71/2458  Systolic BP 099 833 825  Diastolic BP 78 65 76  Wt. (Lbs) 165.6 161.8 158  BMI 28.43 27.77 26.7     Physical Exam  Constitutional: She appears well-developed and well-nourished.  Eyes: Conjunctivae are normal. Pupils are equal, round, and reactive to light.  Neck: No JVD present.  Cardiovascular: Normal rate, regular rhythm, normal heart sounds and intact distal pulses.  Pulmonary/Chest: Effort normal and breath sounds normal.  Abdominal: Soft. Bowel sounds are normal. There is no tenderness.  Skin: Skin is warm and dry.  Psychiatric: She has a normal mood and affect.  Nursing note and vitals reviewed.    Assessment & Plan:   1. Essential hypertension  - hydrochlorothiazide (HYDRODIURIL) 25 MG tablet; Take 1 tablet (25 mg total) by mouth daily.  Dispense: 30 tablet; Refill: 3 - Basic metabolic panel  2. Screening cholesterol level  - Lipid Panel; Future      Follow-up: Return in about 3 months (around 06/11/2017) for HTN.   Alfonse Spruce FNP

## 2017-03-13 NOTE — Progress Notes (Signed)
Pt here for 3 month f/u: HTN Fatigue Pt has soreness on right upper side of back.

## 2017-03-14 LAB — BASIC METABOLIC PANEL
BUN/Creatinine Ratio: 16 (ref 9–23)
BUN: 14 mg/dL (ref 6–24)
CALCIUM: 9.3 mg/dL (ref 8.7–10.2)
CHLORIDE: 100 mmol/L (ref 96–106)
CO2: 26 mmol/L (ref 20–29)
Creatinine, Ser: 0.9 mg/dL (ref 0.57–1.00)
GFR calc Af Amer: 86 mL/min/{1.73_m2} (ref >59)
GFR calc non Af Amer: 74 mL/min/{1.73_m2} (ref >59)
Glucose: 132 mg/dL — ABNORMAL HIGH (ref 65–99)
POTASSIUM: 3.9 mmol/L (ref 3.5–5.2)
Sodium: 142 mmol/L (ref 134–144)

## 2017-03-16 ENCOUNTER — Telehealth: Payer: Self-pay

## 2017-03-16 NOTE — Telephone Encounter (Signed)
Contacted pt to go over lab results pt didn't answer left a detailed message and if she has any questions or concerns to give me a call

## 2017-04-05 MED FILL — HYDROCHLOROTHIAZIDE 25 MG T: 25 | 30 days supply | Qty: 30 | Fill #0

## 2017-04-06 MED FILL — SYMBICORT 160-4.5 MCG INH: 160-4.5 | 30 days supply | Qty: 10 | Fill #1

## 2017-05-02 ENCOUNTER — Ambulatory Visit: Payer: Medicaid Other | Admitting: Adult Health

## 2017-05-03 ENCOUNTER — Ambulatory Visit (INDEPENDENT_AMBULATORY_CARE_PROVIDER_SITE_OTHER): Payer: 59 | Admitting: Adult Health

## 2017-05-03 ENCOUNTER — Encounter: Payer: Self-pay | Admitting: Adult Health

## 2017-05-03 DIAGNOSIS — J449 Chronic obstructive pulmonary disease, unspecified: Secondary | ICD-10-CM | POA: Diagnosis not present

## 2017-05-03 DIAGNOSIS — D869 Sarcoidosis, unspecified: Secondary | ICD-10-CM | POA: Diagnosis not present

## 2017-05-03 MED ORDER — BUDESONIDE-FORMOTEROL FUMARATE 160-4.5 MCG/ACT IN AERO: 2.0000 | INHALATION_SPRAY | Freq: Every day | RESPIRATORY_TRACT | 5 refills | Status: DC

## 2017-05-03 NOTE — Addendum Note (Signed)
Addended by: Parke Poisson E on: 05/03/2017 05:20 PM   Modules accepted: Orders

## 2017-05-03 NOTE — Assessment & Plan Note (Signed)
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  Work on not smoking  Chest xray today .  Yearly eye exams  Follow up with Dr. Vaughan Browner in 6 months and As needed

## 2017-05-03 NOTE — Assessment & Plan Note (Signed)
Currently stable  Check cxr today   Plan  Patient Instructions  Continue on Symbicort 2 puffs Twice daily, rinse after use.  Work on not smoking  Chest xray today .  Yearly eye exams  Follow up with Dr. Vaughan Browner in 6 months and As needed

## 2017-05-03 NOTE — Progress Notes (Signed)
@Patient  ID: Sarah Cruz, female    DOB: 1965/09/08, 52 y.o.   MRN: [id number]  Chief Complaint  Patient presents with  . Follow-up    COPD    Referring provider: Orlando Penner*  HPI: 52 year old female active smoker followed for severe COPD, sarcoidosis, history of TB exposure  TEST  PFT 11/02/16: FVC 2.01 L (68%) FEV1 1.11 L (47%) FEV1/FVC 0.55 FEF 25-75 0.52 L (21%) positive bronchodilator response 04/05/16: FVC 2.40 L (81%) FEV1 1.41 L (60%) FEV1/FVC 0.59 FEF 25-75 0.65 L (26%) negative bronchodilator response   TLC 4.54 L (80%) RV 119% ERV 117% DLCO corrected 61% 11/12/15: FVC 1.81 L (61%) FEV1 1.05 L (44%) FEV1/FVC 0.58 FEF 25-75 0.54 L (21%) positive bronchodilator response    TLC 5.29 L (103%) RV 187% ERV 75% DLC uncorrected 58%  6MWT 04/05/16:  Walked 437 meters / Baseline Sat 100% on RA / Nadir Sat 99% on RA @ end of test 11/19/15:  Walked 444 meters / Baseline Sat 99% on RA / Nadir Sat 98% on RA @ end of test  IMAGING CT CHEST W/ 10/16/15 (): Bilateral parenchymal nodules and opacities. There is some mild cavitation in the nodules of the upper lobes suggested. No pleural effusion or thickening. Prominent axillary lymph nodes. Bilateral hilar lymphadenopathy measuring up to 1.8 cm as well as mediastinal adenopathy with subcarinal lymph node measuring approximately 2.2 cm in short axis. No pericardial effusion.  EKG 10/16/15 ( QTC 411 milliseconds. Normal sinus rhythm. No sign of conduction delay or suggestion of ischemia.  PATHOLOGY Right Lower Lobe Transbronchial Biopsy (12/07/15):  Benign Lung Tissue / No Malignancy EBUS FNA Level 7 (12/07/15):  Benign reactive/reparative changes suggesting granulomatous inflammation.   MICROBIOLOGY Quantiferon TB (11/19/15):  Negative  Skin PPD (10/25/15): Negative Urine Histoplasma Ag (10/22/15): Negative Urine Blastomyces Ag (10/22/15): Negative   LABS On/15/17 Alpha-1 antitrypsin: MM (151)  10/22/15 LFT:  4.6/8.2/0.3/68/15/13 CRP: 0.2 ESR: 28 LDH: 138 ACE: 63 ANCA Screen:  Negative  ANA: Negative Anti-Jo1: <0.2 Centrometere Ab Screen: <0.2 DS DNA Ab: 1 RNP Ab: 0.3 Smith Ab: <0.2 Chromatin Ab: <0.2 RF: <10 Anti-CCP: <16 SSA: <0.2 SSB: 0.4 SCL-70: <0.2  10/16/15 Troponin I: 0.01 CBC: 4.4/12.0/39.3/316 BMP: 137/4.3/104/25/11/0.78/92/9.5 D-dimer:  <0.27   05/03/2017 follow-up: COPD, sarcoid Patient presents for a six-month follow-up.  Patient has underlying sarcoidosis.  Patient says overall that her breathing is doing okay.  She denies any flare of cough or wheezing.  Reminded patient on smoking cessation and yearly eye exams   Patient has underlying severe COPD.  Unfortunately she continues to smoke.  Patient has recently been able to restart on her Symbicort.  She does feel that this helps with her breathing.  She denies any increased shortness of breath.  Discussed smoking cessation  No Known Allergies  Immunization History  Administered Date(s) Administered  . PPD Test 10/22/2015  . Pneumococcal Conjugate-13 10/22/2015    Past Medical History:  Diagnosis Date  . Childhood asthma   . Hypertension     Tobacco History: Social History   Tobacco Use  Smoking Status Current Every Day Smoker  . Packs/day: 1.50  . Years: 38.00  . Pack years: 57.00  . Types: Cigarettes  . Start date: 04/08/1977  Smokeless Tobacco Never Used  Tobacco Comment   down to 0.5ppd   Ready to quit: No Counseling given: Yes Comment: down to 0.5ppd   Outpatient Encounter Medications as of 05/03/2017  Medication Sig  . acetaminophen (TYLENOL)  500 MG tablet Take 1,000 mg by mouth 2 (two) times daily as needed for mild pain or headache.  . albuterol (PROVENTIL HFA;VENTOLIN HFA) 108 (90 Base) MCG/ACT inhaler Inhale 2 puffs into the lungs every 6 (six) hours as needed for wheezing or shortness of breath.  . budesonide-formoterol (SYMBICORT) 160-4.5 MCG/ACT inhaler Inhale 2  puffs into the lungs daily.  . carbamide peroxide (DEBROX) 6.5 % OTIC solution Place 10 drops into both ears 2 (two) times daily.  . fluticasone (FLONASE) 50 MCG/ACT nasal spray Place 2 sprays into both nostrils daily as needed for allergies or rhinitis.  . hydrochlorothiazide (HYDRODIURIL) 25 MG tablet Take 1 tablet (25 mg total) by mouth daily.  Marland Kitchen ibuprofen (ADVIL,MOTRIN) 600 MG tablet Take 1 tablet (600 mg total) by mouth every 8 (eight) hours as needed for headache, moderate pain or cramping (Take with food).  . [DISCONTINUED] budesonide-formoterol (SYMBICORT) 160-4.5 MCG/ACT inhaler Inhale 2 puffs into the lungs daily.   No facility-administered encounter medications on file as of 05/03/2017.      Review of Systems  Constitutional:   No  weight loss, night sweats,  Fevers, chills, fatigue, or  lassitude.  HEENT:   No headaches,  Difficulty swallowing,  Tooth/dental problems, or  Sore throat,                No sneezing, itching, ear ache, nasal congestion, post nasal drip,   CV:  No chest pain,  Orthopnea, PND, swelling in lower extremities, anasarca, dizziness, palpitations, syncope.   GI  No heartburn, indigestion, abdominal pain, nausea, vomiting, diarrhea, change in bowel habits, loss of appetite, bloody stools.   Resp:   No chest wall deformity  Skin: no rash or lesions.  GU: no dysuria, change in color of urine, no urgency or frequency.  No flank pain, no hematuria   MS:  No joint pain or swelling.  No decreased range of motion.  No back pain.    Physical Exam  BP 120/78 (BP Location: Left Arm, Cuff Size: Normal)   Pulse 73   Ht 5' 4.5" (1.638 m)   Wt 165 lb 9.6 oz (75.1 kg)   SpO2 99%   BMI 27.99 kg/m   GEN: A/Ox3; pleasant , NAD, well nourished    HEENT:  [place]/AT,  EACs-clear, TMs-wnl, NOSE-clear, THROAT-clear, no lesions, no postnasal drip or exudate noted.   NECK:  Supple w/ fair ROM; no JVD; normal carotid impulses w/o bruits; no thyromegaly or nodules  palpated; no lymphadenopathy.    RESP  Clear  P & A; w/o, wheezes/ rales/ or rhonchi. no accessory muscle use, no dullness to percussion  CARD:  RRR, no m/r/g, no peripheral edema, pulses intact, no cyanosis or clubbing.  GI:   Soft & nt; nml bowel sounds; no organomegaly or masses detected.   Musco: Warm bil, no deformities or joint swelling noted.   Neuro: alert, no focal deficits noted.    Skin: Warm, no lesions or rashes    Lab Results:  CBC  BNP No results found for: BNP  ProBNP No results found for: PROBNP  Imaging: No results found.   Assessment & Plan:   COPD, severe ([place]) Severe COPD currently compensated on Symbicort Chest x-ray today Plan  Patient Instructions  Continue on Symbicort 2 puffs Twice daily, rinse after use.  Work on not smoking  Chest xray today .  Yearly eye exams  Follow up with Dr. Vaughan Browner in 6 months and As needed  Sarcoidosis ([place]) Currently stable  Check cxr today   Plan  Patient Instructions  Continue on Symbicort 2 puffs Twice daily, rinse after use.  Work on not smoking  Chest xray today .  Yearly eye exams  Follow up with Dr. Vaughan Browner in 6 months and As needed           Rexene Edison, NP 05/03/2017

## 2017-05-03 NOTE — Patient Instructions (Signed)
Continue on Symbicort 2 puffs Twice daily, rinse after use.  Work on not smoking  Chest xray today .  Yearly eye exams  Follow up with Dr. Vaughan Browner in 6 months and As needed

## 2017-05-07 MED FILL — HYDROCHLOROTHIAZIDE 25 MG T: 25 | 30 days supply | Qty: 30 | Fill #1

## 2017-05-09 ENCOUNTER — Telehealth: Payer: Self-pay | Admitting: Family Medicine

## 2017-05-09 NOTE — Telephone Encounter (Signed)
Pt has an appt on 06/19/17 neexd to know is she need to coming early on tha same day to do a lab for cholesterol check or need a few days early to do that please call her back

## 2017-05-10 MED FILL — SYMBICORT 160-4.5 MCG INH: 160-4.5 | 30 days supply | Qty: 10 | Fill #0

## 2017-05-10 NOTE — Telephone Encounter (Signed)
CMA attempt to call patient back, no answer and left a Vm.  If patient call back please inform her that: She do not need to fast or make appt.

## 2017-06-08 MED FILL — HYDROCHLOROTHIAZIDE 25 MG T: 25 | 30 days supply | Qty: 30 | Fill #2

## 2017-06-19 ENCOUNTER — Encounter: Payer: Self-pay | Admitting: Nurse Practitioner

## 2017-06-19 ENCOUNTER — Ambulatory Visit: Payer: 59 | Attending: Nurse Practitioner | Admitting: Nurse Practitioner

## 2017-06-19 VITALS — BP 111/75 | HR 67 | Temp 98.7°F | Resp 12 | Ht 64.0 in | Wt 163.0 lb

## 2017-06-19 DIAGNOSIS — J449 Chronic obstructive pulmonary disease, unspecified: Secondary | ICD-10-CM | POA: Insufficient documentation

## 2017-06-19 DIAGNOSIS — Z79899 Other long term (current) drug therapy: Secondary | ICD-10-CM | POA: Diagnosis not present

## 2017-06-19 DIAGNOSIS — F17200 Nicotine dependence, unspecified, uncomplicated: Secondary | ICD-10-CM | POA: Diagnosis not present

## 2017-06-19 DIAGNOSIS — D869 Sarcoidosis, unspecified: Secondary | ICD-10-CM | POA: Insufficient documentation

## 2017-06-19 DIAGNOSIS — Z8249 Family history of ischemic heart disease and other diseases of the circulatory system: Secondary | ICD-10-CM | POA: Diagnosis not present

## 2017-06-19 DIAGNOSIS — Z1231 Encounter for screening mammogram for malignant neoplasm of breast: Secondary | ICD-10-CM | POA: Diagnosis not present

## 2017-06-19 DIAGNOSIS — I10 Essential (primary) hypertension: Secondary | ICD-10-CM

## 2017-06-19 DIAGNOSIS — F17210 Nicotine dependence, cigarettes, uncomplicated: Secondary | ICD-10-CM | POA: Insufficient documentation

## 2017-06-19 MED ORDER — HYDROCHLOROTHIAZIDE 25 MG PO TABS: 25.0000 mg | ORAL_TABLET | Freq: Every day | ORAL | 1 refills | Status: DC

## 2017-06-19 NOTE — Progress Notes (Signed)
Assessment & Plan:  Sarah Cruz was seen today for establish care.  Diagnoses and all orders for this visit:  Essential hypertension -     hydrochlorothiazide (HYDRODIURIL) 25 MG tablet; Take 1 tablet (25 mg total) by mouth daily. Continue all antihypertensives as prescribed.  Remember to bring in your blood pressure log with you for your follow up appointment.  DASH/Mediterranean Diets are healthier choices for HTN.   Tobacco dependence Sarah Cruz was counseled on the dangers of tobacco use, and was advised to quit. Reviewed strategies to maximize success, including removing cigarettes and smoking materials from environment, stress management and support of family/friends as well as pharmacological alternatives including: Wellbutrin, Chantix, Nicotine patch, Nicotine gum or lozenges. Smoking cessation support: smoking cessation hotline: 1-800-QUIT-NOW.  Smoking cessation classes are also available through [facility]. Call [phone] or visit our website at [email or web address]   A total of 3 minutes was spent on counseling for smoking cessation and Sarah Cruz is not ready to quit.   Breast cancer screening by mammogram -     MM 3D SCREEN BREAST BILATERAL; Future    Patient has been counseled on age-appropriate routine health concerns for screening and prevention. These are reviewed and up-to-date. Referrals have been placed accordingly. Immunizations are up-to-date or declined.    Subjective:   Chief Complaint  Patient presents with  . Establish Care    Pt. is here to establish care ofr hypertension.    HPI Sarah Cruz 52 y.o. female presents to office today to establish care. She has a history of COPD with sarcoidosis, HTN.  ESSENTIAL HYPERTENSION Chronic.  Stable and well controlled.  Currently taking hydrochlorothiazide 25 mg daily as prescribed.  She denies any chest pain, shortness of breath, palpitations, headaches, visual disturbances, nausea or  vomiting, or bilateral lower extremity edema.  BP Readings from Last 3 Encounters:  06/19/17 111/75  05/03/17 120/78  03/13/17 129/78   COPD/Sarcoidosis Currently same problem he states months for follow-up.  She does continue to smoke however is motivated to quit but declines any smoking cessation aids.  She is currently taking Symbicort daily as prescribed with significant relief of symptoms.  He denies any excessive use of her SABA.  Review of Systems  Constitutional: Negative for fever, malaise/fatigue and weight loss.  HENT: Negative.  Negative for nosebleeds.   Eyes: Negative.  Negative for blurred vision, double vision and photophobia.  Respiratory: Negative.  Negative for cough and shortness of breath.   Cardiovascular: Negative.  Negative for chest pain, palpitations and leg swelling.  Gastrointestinal: Negative.  Negative for heartburn, nausea and vomiting.  Musculoskeletal: Negative.  Negative for myalgias.  Neurological: Negative.  Negative for dizziness, focal weakness, seizures and headaches.  Psychiatric/Behavioral: Negative.  Negative for suicidal ideas.    Past Medical History:  Diagnosis Date  . Childhood asthma   . COPD (chronic obstructive pulmonary disease) ([place])   . Hypertension   . Sarcoidosis     Past Surgical History:  Procedure Laterality Date  . ABDOMINAL HYSTERECTOMY    . ENDOBRONCHIAL ULTRASOUND Bilateral 12/07/2015   Procedure: ENDOBRONCHIAL ULTRASOUND;  Surgeon: Javier Glazier, MD;  Location: [facility];  Service: Cardiopulmonary;  Laterality: Bilateral;    Family History  Problem Relation Age of Onset  . Heart failure Mother   . Diabetes Mother   . Hypertension Mother   . Arthritis Mother   . Brain cancer Sister   . Sickle cell anemia Other   . Brain cancer Other   .  Asthma Other   . Rheumatologic disease Neg Hx     Social History Reviewed with no changes to be made today.   Outpatient Medications Prior to Visit  Medication Sig  Dispense Refill  . acetaminophen (TYLENOL) 500 MG tablet Take 1,000 mg by mouth 2 (two) times daily as needed for mild pain or headache.    . albuterol (PROVENTIL HFA;VENTOLIN HFA) 108 (90 Base) MCG/ACT inhaler Inhale 2 puffs into the lungs every 6 (six) hours as needed for wheezing or shortness of breath. 1 Inhaler 2  . budesonide-formoterol (SYMBICORT) 160-4.5 MCG/ACT inhaler Inhale 2 puffs into the lungs daily. 1 Inhaler 5  . carbamide peroxide (DEBROX) 6.5 % OTIC solution Place 10 drops into both ears 2 (two) times daily. 15 mL 0  . ibuprofen (ADVIL,MOTRIN) 600 MG tablet Take 1 tablet (600 mg total) by mouth every 8 (eight) hours as needed for headache, moderate pain or cramping (Take with food). 30 tablet 1  . hydrochlorothiazide (HYDRODIURIL) 25 MG tablet Take 1 tablet (25 mg total) by mouth daily. 30 tablet 3  . fluticasone (FLONASE) 50 MCG/ACT nasal spray Place 2 sprays into both nostrils daily as needed for allergies or rhinitis. (Patient not taking: Reported on 06/19/2017) 16 g 6   No facility-administered medications prior to visit.     No Known Allergies     Objective:    BP 111/75 (BP Location: Left Arm, Patient Position: Sitting, Cuff Size: Normal)   Pulse 67   Temp 98.7 F (37.1 C) (Oral)   Ht 5\' 4"  (1.626 m)   Wt 163 lb (73.9 kg)   SpO2 99%   BMI 27.98 kg/m  Wt Readings from Last 3 Encounters:  06/19/17 163 lb (73.9 kg)  05/03/17 165 lb 9.6 oz (75.1 kg)  03/13/17 165 lb 9.6 oz (75.1 kg)    Physical Exam  Constitutional: She is oriented to person, place, and time. She appears well-developed and well-nourished. She is cooperative.  HENT:  Head: Normocephalic and atraumatic.  Eyes: EOM are normal.  Neck: Normal range of motion.  Cardiovascular: Normal rate, regular rhythm and normal heart sounds. Exam reveals no gallop and no friction rub.  No murmur heard. Pulmonary/Chest: Effort normal and breath sounds normal. No stridor. No tachypnea. No respiratory distress.  She has no decreased breath sounds. She has no wheezes. She has no rhonchi. She has no rales. She exhibits no tenderness.  Abdominal: Soft. Bowel sounds are normal.  Musculoskeletal: Normal range of motion. She exhibits no edema.  Neurological: She is alert and oriented to person, place, and time. Coordination normal.  Skin: Skin is warm and dry.  Psychiatric: She has a normal mood and affect. Her behavior is normal. Judgment and thought content normal.  Nursing note and vitals reviewed.        Patient has been counseled extensively about nutrition and exercise as well as the importance of adherence with medications and regular follow-up. The patient was given clear instructions to go to ER or return to medical center if symptoms don't improve, worsen or new problems develop. The patient verbalized understanding.   Follow-up: Return in about 8 weeks (around 08/17/2017) for FASTING LABS .   Gildardo Pounds, FNP-BC [facility] [place], [place]   06/21/2017, 10:50 AM

## 2017-06-19 NOTE — Patient Instructions (Addendum)
1-800-QUIT-NOW  Coping with Quitting Smoking Quitting smoking is a physical and mental challenge. You will face cravings, withdrawal symptoms, and temptation. Before quitting, work with your health care provider to make a plan that can help you cope. Preparation can help you quit and keep you from giving in. How can I cope with cravings? Cravings usually last for 5-10 minutes. If you get through it, the craving will pass. Consider taking the following actions to help you cope with cravings:  Keep your mouth busy: ? Chew sugar-free gum. ? Suck on hard candies or a straw. ? Brush your teeth.  Keep your hands and body busy: ? Immediately change to a different activity when you feel a craving. ? Squeeze or play with a ball. ? Do an activity or a hobby, like making bead jewelry, practicing needlepoint, or working with wood. ? Mix up your normal routine. ? Take a short exercise break. Go for a quick walk or run up and down stairs. ? Spend time in public places where smoking is not allowed.  Focus on doing something kind or helpful for someone else.  Call a friend or family member to talk during a craving.  Join a support group.  Call a quit line, such as 1-800-QUIT-NOW.  Talk with your health care provider about medicines that might help you cope with cravings and make quitting easier for you.  How can I deal with withdrawal symptoms? Your body may experience negative effects as it tries to get used to not having nicotine in the system. These effects are called withdrawal symptoms. They may include:  Feeling hungrier than normal.  Trouble concentrating.  Irritability.  Trouble sleeping.  Feeling depressed.  Restlessness and agitation.  Craving a cigarette.  To manage withdrawal symptoms:  Avoid places, people, and activities that trigger your cravings.  Remember why you want to quit.  Get plenty of sleep.  Avoid coffee and other caffeinated drinks. These may worsen  some of your symptoms.  How can I handle social situations? Social situations can be difficult when you are quitting smoking, especially in the first few weeks. To manage this, you can:  Avoid parties, bars, and other social situations where people might be smoking.  Avoid alcohol.  Leave right away if you have the urge to smoke.  Explain to your family and friends that you are quitting smoking. Ask for understanding and support.  Plan activities with friends or family where smoking is not an option.  What are some ways I can cope with stress? Wanting to smoke may cause stress, and stress can make you want to smoke. Find ways to manage your stress. Relaxation techniques can help. For example:  Breathe slowly and deeply, in through your nose and out through your mouth.  Listen to soothing, relaxing music.  Talk with a family member or friend about your stress.  Light a candle.  Soak in a bath or take a shower.  Think about a peaceful place.  What are some ways I can prevent weight gain? Be aware that many people gain weight after they quit smoking. However, not everyone does. To keep from gaining weight, have a plan in place before you quit and stick to the plan after you quit. Your plan should include:  Having healthy snacks. When you have a craving, it may help to: ? Eat plain popcorn, crunchy carrots, celery, or other cut vegetables. ? Chew sugar-free gum.  Changing how you eat: ? Eat small portion sizes at  meals. ? Eat 4-6 small meals throughout the day instead of 1-2 large meals a day. ? Be mindful when you eat. Do not watch television or do other things that might distract you as you eat.  Exercising regularly: ? Make time to exercise each day. If you do not have time for a long workout, do short bouts of exercise for 5-10 minutes several times a day. ? Do some form of strengthening exercise, like weight lifting, and some form of aerobic exercise, like running or  swimming.  Drinking plenty of water or other low-calorie or no-calorie drinks. Drink 6-8 glasses of water daily, or as much as instructed by your health care provider.  Summary  Quitting smoking is a physical and mental challenge. You will face cravings, withdrawal symptoms, and temptation to smoke again. Preparation can help you as you go through these challenges.  You can cope with cravings by keeping your mouth busy (such as by chewing gum), keeping your body and hands busy, and making calls to family, friends, or a helpline for people who want to quit smoking.  You can cope with withdrawal symptoms by avoiding places where people smoke, avoiding drinks with caffeine, and getting plenty of rest.  Ask your health care provider about the different ways to prevent weight gain, avoid stress, and handle social situations. This information is not intended to replace advice given to you by your health care provider. Make sure you discuss any questions you have with your health care provider. Document Released: 01/28/2016 Document Revised: 01/28/2016 Document Reviewed: 01/28/2016 Elsevier Interactive Patient Education  2018 [employer].  Steps to Quit Smoking Smoking tobacco can be bad for your health. It can also affect almost every organ in your body. Smoking puts you and people around you at risk for many serious long-lasting (chronic) diseases. Quitting smoking is hard, but it is one of the best things that you can do for your health. It is never too late to quit. What are the benefits of quitting smoking? When you quit smoking, you lower your risk for getting serious diseases and conditions. They can include:  Lung cancer or lung disease.  Heart disease.  Stroke.  Heart attack.  Not being able to have children (infertility).  Weak bones (osteoporosis) and broken bones (fractures).  If you have coughing, wheezing, and shortness of breath, those symptoms may get better when you  quit. You may also get sick less often. If you are pregnant, quitting smoking can help to lower your chances of having a baby of low birth weight. What can I do to help me quit smoking? Talk with your doctor about what can help you quit smoking. Some things you can do (strategies) include:  Quitting smoking totally, instead of slowly cutting back how much you smoke over a period of time.  Going to in-person counseling. You are more likely to quit if you go to many counseling sessions.  Using resources and support systems, such as: ? Database administrator with a Social worker. ? Phone quitlines. ? Careers information officer. ? Support groups or group counseling. ? Text messaging programs. ? Mobile phone apps or applications.  Taking medicines. Some of these medicines may have nicotine in them. If you are pregnant or breastfeeding, do not take any medicines to quit smoking unless your doctor says it is okay. Talk with your doctor about counseling or other things that can help you.  Talk with your doctor about using more than one strategy at the same time,  such as taking medicines while you are also going to in-person counseling. This can help make quitting easier. What things can I do to make it easier to quit? Quitting smoking might feel very hard at first, but there is a lot that you can do to make it easier. Take these steps:  Talk to your family and friends. Ask them to support and encourage you.  Call phone quitlines, reach out to support groups, or work with a Social worker.  Ask people who smoke to not smoke around you.  Avoid places that make you want (trigger) to smoke, such as: ? Bars. ? Parties. ? Smoke-break areas at work.  Spend time with people who do not smoke.  Lower the stress in your life. Stress can make you want to smoke. Try these things to help your stress: ? Getting regular exercise. ? Deep-breathing exercises. ? Yoga. ? Meditating. ? Doing a body scan. To do this, close  your eyes, focus on one area of your body at a time from head to toe, and notice which parts of your body are tense. Try to relax the muscles in those areas.  Download or buy apps on your mobile phone or tablet that can help you stick to your quit plan. There are many free apps, such as QuitGuide from the [employer] Office manager for Disease Control and Prevention). You can find more support from smokefree.gov and other websites.  This information is not intended to replace advice given to you by your health care provider. Make sure you discuss any questions you have with your health care provider. Document Released: 11/26/2008 Document Revised: 09/28/2015 Document Reviewed: 06/16/2014 Elsevier Interactive Patient Education  2018 [employer].

## 2017-06-21 ENCOUNTER — Encounter: Payer: Self-pay | Admitting: Nurse Practitioner

## 2017-07-05 MED FILL — SYMBICORT 160-4.5 MCG INH: 160-4.5 | 30 days supply | Qty: 10 | Fill #1

## 2017-07-05 MED FILL — HYDROCHLOROTHIAZIDE 25 MG T: 25 | 30 days supply | Qty: 30 | Fill #0

## 2017-08-06 MED FILL — HYDROCHLOROTHIAZIDE 25 MG T: 25 | 30 days supply | Qty: 30 | Fill #1

## 2017-08-08 MED FILL — SYMBICORT 160-4.5 MCG INH: 160-4.5 | 30 days supply | Qty: 10 | Fill #2

## 2017-08-14 ENCOUNTER — Ambulatory Visit: Payer: 59 | Attending: Nurse Practitioner | Admitting: Nurse Practitioner

## 2017-08-14 ENCOUNTER — Encounter: Payer: Self-pay | Admitting: Nurse Practitioner

## 2017-08-14 VITALS — BP 119/79 | HR 62 | Temp 98.5°F | Ht 64.0 in | Wt 159.4 lb

## 2017-08-14 DIAGNOSIS — D869 Sarcoidosis, unspecified: Secondary | ICD-10-CM | POA: Diagnosis not present

## 2017-08-14 DIAGNOSIS — G44201 Tension-type headache, unspecified, intractable: Secondary | ICD-10-CM | POA: Diagnosis present

## 2017-08-14 DIAGNOSIS — J449 Chronic obstructive pulmonary disease, unspecified: Secondary | ICD-10-CM | POA: Insufficient documentation

## 2017-08-14 DIAGNOSIS — I10 Essential (primary) hypertension: Secondary | ICD-10-CM | POA: Insufficient documentation

## 2017-08-14 DIAGNOSIS — Z0000 Encounter for general adult medical examination without abnormal findings: Secondary | ICD-10-CM

## 2017-08-14 DIAGNOSIS — G44209 Tension-type headache, unspecified, not intractable: Secondary | ICD-10-CM

## 2017-08-14 DIAGNOSIS — Z79899 Other long term (current) drug therapy: Secondary | ICD-10-CM | POA: Insufficient documentation

## 2017-08-14 DIAGNOSIS — Z808 Family history of malignant neoplasm of other organs or systems: Secondary | ICD-10-CM | POA: Insufficient documentation

## 2017-08-14 DIAGNOSIS — Z7951 Long term (current) use of inhaled steroids: Secondary | ICD-10-CM | POA: Insufficient documentation

## 2017-08-14 DIAGNOSIS — Z8249 Family history of ischemic heart disease and other diseases of the circulatory system: Secondary | ICD-10-CM | POA: Insufficient documentation

## 2017-08-14 DIAGNOSIS — Z833 Family history of diabetes mellitus: Secondary | ICD-10-CM | POA: Insufficient documentation

## 2017-08-14 MED ORDER — IBUPROFEN 800 MG PO TABS: 800.0000 mg | ORAL_TABLET | Freq: Four times a day (QID) | ORAL | 1 refills | Status: DC | PRN

## 2017-08-14 MED FILL — IBUPROFEN 800 MG TABLET: 800 | 15 days supply | Qty: 60 | Fill #0

## 2017-08-14 NOTE — Patient Instructions (Signed)
Sinus Headache A sinus headache happens when your sinuses become clogged or swollen. You may feel pain or pressure in your face, forehead, ears, or upper teeth. Sinus headaches can be mild or severe. Follow these instructions at home:  Take medicines only as told by your doctor.  If you were given an antibiotic medicine, finish all of it even if you start to feel better.  Use a nose spray if you feel stuffed up (congested).  If told, apply a warm, moist washcloth to your face to help lessen pain. Contact a doctor if:  You get headaches more than one time each week.  Light or sound bothers you.  You have a fever.  You feel sick to your stomach (nauseous) or you throw up (vomit).  Your headaches do not get better with treatment. Get help right away if:  You have trouble seeing.  You suddenly have very bad pain in your face or head.  You start to twitch or shake (seizure).  You are confused.  You have a stiff neck. This information is not intended to replace advice given to you by your health care provider. Make sure you discuss any questions you have with your health care provider. Document Released: 06/01/2010 Document Revised: 09/26/2015 Document Reviewed: 01/26/2014 Elsevier Interactive Patient Education  2018 Elsevier Inc.  

## 2017-08-14 NOTE — Progress Notes (Signed)
Assessment & Plan:  Sarah Cruz was seen today for follow-up.  Diagnoses and all orders for this visit:  Acute non intractable tension-type headache -     ibuprofen (ADVIL,MOTRIN) 800 MG tablet; Take 1 tablet (800 mg total) by mouth every 6 (six) hours as needed for moderate pain (Take with food).  Routine adult health maintenance -     CBC -     Lipid panel -     TSH  Essential hypertension Continue all antihypertensives as prescribed.  Remember to bring in your blood pressure log with you for your follow up appointment.  DASH/Mediterranean Diets are healthier choices for HTN.     Patient has been counseled on age-appropriate routine health concerns for screening and prevention. These are reviewed and up-to-date. Referrals have been placed accordingly. Immunizations are up-to-date or declined.    Subjective:   Chief Complaint  Patient presents with  . Follow-up    Pt. is here for follow-up on and fasting for blood work.    HPI Sarah Cruz 52 y.o. female presents to office today requesting a refill of ibuprofen for headaches.  Headaches Chronic and intermittent in nature. Mostly seasonal and occurring with weather changes. Location: frontal. Pain described as aching. She denies any nausea, vomiting or visual disturbances with headaches. Relieving factors: Ibuprofen used sparingly. She denies any current headache.   CHRONIC HYPERTENSION Disease Monitoring  Blood pressure range: She does not check her blood pressure at home however her BP is controlled with HCTZ 25mg  daily. Weight is down 6lbs.  BP Readings from Last 3 Encounters:  08/14/17 119/79  06/19/17 111/75  05/03/17 120/78    Chest pain: no   Dyspnea: no   Claudication: no  Medication compliance: yes  Medication Side Effects  Lightheadedness: no   Urinary frequency: no   Edema: no   Impotence: no  Preventitive Healthcare:  Exercise: no   Diet Pattern: salt not added to cooking  Salt Restriction:  yes     Review of Systems  Constitutional: Negative for fever, malaise/fatigue and weight loss.  HENT: Negative.  Negative for nosebleeds.   Eyes: Negative.  Negative for blurred vision, double vision and photophobia.  Respiratory: Negative.  Negative for cough and shortness of breath.   Cardiovascular: Negative.  Negative for chest pain, palpitations and leg swelling.  Gastrointestinal: Negative.  Negative for heartburn, nausea and vomiting.  Musculoskeletal: Negative.  Negative for myalgias.  Neurological: Positive for headaches. Negative for dizziness, focal weakness and seizures.  Psychiatric/Behavioral: Negative.  Negative for suicidal ideas.    Past Medical History:  Diagnosis Date  . Childhood asthma   . COPD (chronic obstructive pulmonary disease) ([place])   . Hypertension   . Sarcoidosis     Past Surgical History:  Procedure Laterality Date  . ABDOMINAL HYSTERECTOMY    . ENDOBRONCHIAL ULTRASOUND Bilateral 12/07/2015   Procedure: ENDOBRONCHIAL ULTRASOUND;  Surgeon: Javier Glazier, MD;  Location: [facility];  Service: Cardiopulmonary;  Laterality: Bilateral;    Family History  Problem Relation Age of Onset  . Heart failure Mother   . Diabetes Mother   . Hypertension Mother   . Arthritis Mother   . Brain cancer Sister   . Sickle cell anemia Other   . Brain cancer Other   . Asthma Other   . Rheumatologic disease Neg Hx     Social History Reviewed with no changes to be made today.   Outpatient Medications Prior to Visit  Medication Sig Dispense Refill  .  acetaminophen (TYLENOL) 500 MG tablet Take 1,000 mg by mouth 2 (two) times daily as needed for mild pain or headache.    . albuterol (PROVENTIL HFA;VENTOLIN HFA) 108 (90 Base) MCG/ACT inhaler Inhale 2 puffs into the lungs every 6 (six) hours as needed for wheezing or shortness of breath. 1 Inhaler 2  . budesonide-formoterol (SYMBICORT) 160-4.5 MCG/ACT inhaler Inhale 2 puffs into the lungs daily. 1 Inhaler 5  .  hydrochlorothiazide (HYDRODIURIL) 25 MG tablet Take 1 tablet (25 mg total) by mouth daily. 90 tablet 1  . ibuprofen (ADVIL,MOTRIN) 600 MG tablet Take 1 tablet (600 mg total) by mouth every 8 (eight) hours as needed for headache, moderate pain or cramping (Take with food). 30 tablet 1  . carbamide peroxide (DEBROX) 6.5 % OTIC solution Place 10 drops into both ears 2 (two) times daily. (Patient not taking: Reported on 08/14/2017) 15 mL 0  . fluticasone (FLONASE) 50 MCG/ACT nasal spray Place 2 sprays into both nostrils daily as needed for allergies or rhinitis. (Patient not taking: Reported on 06/19/2017) 16 g 6   No facility-administered medications prior to visit.     No Known Allergies     Objective:    BP 119/79 (BP Location: Left Arm, Patient Position: Sitting, Cuff Size: Normal)   Pulse 62   Temp 98.5 F (36.9 C) (Oral)   Ht 5\' 4"  (1.626 m)   Wt 159 lb 6.4 oz (72.3 kg)   SpO2 99%   BMI 27.36 kg/m  Wt Readings from Last 3 Encounters:  08/14/17 159 lb 6.4 oz (72.3 kg)  06/19/17 163 lb (73.9 kg)  05/03/17 165 lb 9.6 oz (75.1 kg)    Physical Exam  Constitutional: She is oriented to person, place, and time. She appears well-developed and well-nourished. She is cooperative.  HENT:  Head: Normocephalic and atraumatic.  Eyes: EOM are normal.  Neck: Normal range of motion.  Cardiovascular: Normal rate, regular rhythm and normal heart sounds. Exam reveals no gallop and no friction rub.  No murmur heard. Pulmonary/Chest: Effort normal and breath sounds normal. No tachypnea. No respiratory distress. She has no decreased breath sounds. She has no wheezes. She has no rhonchi. She has no rales. She exhibits no tenderness.  Abdominal: Bowel sounds are normal.  Musculoskeletal: Normal range of motion. She exhibits no edema.  Neurological: She is alert and oriented to person, place, and time. Coordination normal.  Skin: Skin is warm and dry.  Psychiatric: She has a normal mood and affect. Her  behavior is normal. Judgment and thought content normal.  Nursing note and vitals reviewed.      Patient has been counseled extensively about nutrition and exercise as well as the importance of adherence with medications and regular follow-up. The patient was given clear instructions to go to ER or return to medical center if symptoms don't improve, worsen or new problems develop. The patient verbalized understanding.   Follow-up: Return in about 4 months (around 12/15/2017) for HTN.   Gildardo Pounds, FNP-BC [facility] [place], [place]   08/14/2017, 9:01 AM

## 2017-08-15 LAB — CBC
HEMATOCRIT: 37.9 % (ref 34.0–46.6)
HEMOGLOBIN: 12.3 g/dL (ref 11.1–15.9)
MCH: 23 pg — ABNORMAL LOW (ref 26.6–33.0)
MCHC: 32.5 g/dL (ref 31.5–35.7)
MCV: 71 fL — AB (ref 79–97)
PLATELETS: 277 10*3/uL (ref 150–450)
RBC: 5.34 x10E6/uL — AB (ref 3.77–5.28)
RDW: 15.6 % — ABNORMAL HIGH (ref 12.3–15.4)
WBC: 4.5 10*3/uL (ref 3.4–10.8)

## 2017-08-15 LAB — LIPID PANEL
CHOL/HDL RATIO: 3.3 ratio (ref 0.0–4.4)
Cholesterol, Total: 219 mg/dL — ABNORMAL HIGH (ref 100–199)
HDL: 67 mg/dL (ref >39)
LDL CALC: 114 mg/dL — AB (ref 0–99)
Triglycerides: 188 mg/dL — ABNORMAL HIGH (ref 0–149)
VLDL CHOLESTEROL CAL: 38 mg/dL (ref 5–40)

## 2017-08-15 LAB — TSH: TSH: 1.64 u[IU]/mL (ref 0.450–4.500)

## 2017-08-17 ENCOUNTER — Other Ambulatory Visit: Payer: 59

## 2017-08-23 ENCOUNTER — Other Ambulatory Visit: Payer: Self-pay | Admitting: Nurse Practitioner

## 2017-08-23 ENCOUNTER — Telehealth: Payer: Self-pay

## 2017-08-23 MED ORDER — ATORVASTATIN CALCIUM 20 MG PO TABS: 20.0000 mg | ORAL_TABLET | Freq: Every day | ORAL | 3 refills | Status: DC

## 2017-08-23 NOTE — Telephone Encounter (Signed)
CMA attempt to call patient to inform on lab results.  No answer and left a VM for patient to call back.  If patient call back , please inform:  Please call patient: Tests show increased cholesterol/lipid levels. I would suggest starting you on statin or cholesterol/lipid lowering medication. Prescription has been sent to the pharmacy. Please also work on a low fat, heart healthy diet and participate in regular aerobic exercise program to control as well by working out at least 150 minutes per week. No red meat,  fried foods. No junk foods, sodas, sugary drinks, unhealthy snacking, or smoking.

## 2017-08-23 NOTE — Telephone Encounter (Signed)
Script has been sent.

## 2017-08-23 NOTE — Telephone Encounter (Signed)
-----   Message from Gildardo Pounds, NP sent at 08/20/2017  8:10 PM EDT ----- Please call patient: Tests show increased cholesterol/lipid levels. I would suggest starting you on statin or cholesterol/lipid lowering medication. Prescription has been sent to the pharmacy. Please also work on a low fat, heart healthy diet and participate in regular aerobic exercise program to control as well by working out at least 150 minutes per week. No red meat,  fried foods. No junk foods, sodas, sugary drinks, unhealthy snacking, or smoking.

## 2017-08-24 MED FILL — ATORVASTATIN 20 MG TABLET: 20 | 31 days supply | Qty: 31 | Fill #0

## 2017-08-24 NOTE — Telephone Encounter (Signed)
CMA attempt to call patient to inform her Rx has been sent.  No answer and left a VM for patient.

## 2017-09-06 MED FILL — HYDROCHLOROTHIAZIDE 25 MG T: 25 | 30 days supply | Qty: 30 | Fill #2

## 2017-09-19 MED FILL — IBUPROFEN 800 MG TABLET: 800 | 15 days supply | Qty: 60 | Fill #1

## 2017-09-19 MED FILL — ATORVASTATIN 20 MG TABLET: 20 | 31 days supply | Qty: 31 | Fill #1

## 2017-10-04 MED FILL — HYDROCHLOROTHIAZIDE 25 MG T: 25 | 30 days supply | Qty: 30 | Fill #3

## 2017-10-23 MED FILL — SYMBICORT 160-4.5 MCG INH: 160-4.5 | 30 days supply | Qty: 10 | Fill #3

## 2017-10-23 MED FILL — ATORVASTATIN 20 MG TABLET: 20 | 31 days supply | Qty: 31 | Fill #2

## 2017-11-02 ENCOUNTER — Ambulatory Visit (INDEPENDENT_AMBULATORY_CARE_PROVIDER_SITE_OTHER): Payer: 59 | Admitting: Pulmonary Disease

## 2017-11-02 ENCOUNTER — Ambulatory Visit (INDEPENDENT_AMBULATORY_CARE_PROVIDER_SITE_OTHER)
Admission: RE | Admit: 2017-11-02 | Discharge: 2017-11-02 | Disposition: A | Discharge status: Home / Self Care | Payer: 59 | Source: Ambulatory Visit | Attending: Pulmonary Disease | Admitting: Pulmonary Disease

## 2017-11-02 ENCOUNTER — Encounter: Payer: Self-pay | Admitting: Pulmonary Disease

## 2017-11-02 VITALS — BP 120/70 | HR 67 | Ht 65.0 in | Wt 162.6 lb

## 2017-11-02 DIAGNOSIS — J449 Chronic obstructive pulmonary disease, unspecified: Secondary | ICD-10-CM

## 2017-11-02 DIAGNOSIS — D869 Sarcoidosis, unspecified: Secondary | ICD-10-CM | POA: Diagnosis not present

## 2017-11-02 NOTE — Patient Instructions (Signed)
I am glad the breathing is stable Continue the Symbicort and albuterol as needed We will get a chest x-ray today Refer to ophthalmology for evaluation of the eye Follow-up in 6 months

## 2017-11-02 NOTE — Progress Notes (Signed)
Sarah Cruz    [id number]    1966/02/11  Primary Care Physician:Fleming, Vernia Buff, NP  Referring Physician: Alfonse Spruce, FNP No address on file  Chief complaint: Follow-up for COPD, sarcoidosis  HPI: 52 year old with history of severe COPD, sarcoidosis Diagnosed with sarcoidosis after bronchoscopy in 2017.  She has not been treated with immunosuppressive's or systemic steroids.  Continues on Symbicort which she is using intermittently once a day..  Continues to smoke  States that her breathing is doing well.  She has mild dyspnea on exertion.  No symptoms at rest.  No cough, sputum production, fevers, chills  Pets: No pets Occupation: Works at [employer] in [employer]. Exposures: No known exposures, no mold, hot tub Smoking history: 57 pack-year smoker.  Continues to smoke half pack per day Travel history: No significant travel history Relevant family history: Father died of lung cancer.  Outpatient Encounter Medications as of 11/02/2017  Medication Sig  . albuterol (PROVENTIL HFA;VENTOLIN HFA) 108 (90 Base) MCG/ACT inhaler Inhale 2 puffs into the lungs every 6 (six) hours as needed for wheezing or shortness of breath.  Marland Kitchen atorvastatin (LIPITOR) 20 MG tablet Take 1 tablet (20 mg total) by mouth daily.  . budesonide-formoterol (SYMBICORT) 160-4.5 MCG/ACT inhaler Inhale 2 puffs into the lungs daily.  Marland Kitchen acetaminophen (TYLENOL) 500 MG tablet Take 1,000 mg by mouth 2 (two) times daily as needed for mild pain or headache.  . carbamide peroxide (DEBROX) 6.5 % OTIC solution Place 10 drops into both ears 2 (two) times daily. (Patient not taking: Reported on 08/14/2017)  . fluticasone (FLONASE) 50 MCG/ACT nasal spray Place 2 sprays into both nostrils daily as needed for allergies or rhinitis. (Patient not taking: Reported on 06/19/2017)  . hydrochlorothiazide (HYDRODIURIL) 25 MG tablet Take 1 tablet (25 mg total) by mouth daily.  Marland Kitchen ibuprofen (ADVIL,MOTRIN) 800 MG  tablet Take 1 tablet (800 mg total) by mouth every 6 (six) hours as needed for moderate pain (Take with food). (Patient not taking: Reported on 11/02/2017)   No facility-administered encounter medications on file as of 11/02/2017.     Allergies as of 11/02/2017  . (No Known Allergies)    Past Medical History:  Diagnosis Date  . Childhood asthma   . COPD (chronic obstructive pulmonary disease) ([place])   . Hypertension   . Sarcoidosis     Past Surgical History:  Procedure Laterality Date  . ABDOMINAL HYSTERECTOMY    . ENDOBRONCHIAL ULTRASOUND Bilateral 12/07/2015   Procedure: ENDOBRONCHIAL ULTRASOUND;  Surgeon: Javier Glazier, MD;  Location: [facility];  Service: Cardiopulmonary;  Laterality: Bilateral;    Family History  Problem Relation Age of Onset  . Heart failure Mother   . Diabetes Mother   . Hypertension Mother   . Arthritis Mother   . Brain cancer Sister   . Sickle cell anemia Other   . Brain cancer Other   . Asthma Other   . Rheumatologic disease Neg Hx     Social History   Socioeconomic History  . Marital status: Divorced    Spouse name: Not on file  . Number of children: 3  . Years of education: Not on file  . Highest education level: Not on file  Occupational History  . Not on file  Social Needs  . Financial resource strain: Not on file  . Food insecurity:    Worry: Not on file    Inability: Not on file  . Transportation needs:  Medical: Not on file    Non-medical: Not on file  Tobacco Use  . Smoking status: Current Every Day Smoker    Packs/day: 1.50    Years: 38.00    Pack years: 57.00    Types: Cigarettes    Start date: 04/08/1977  . Smokeless tobacco: Never Used  . Tobacco comment: down to 0.5ppd  Substance and Sexual Activity  . Alcohol use: No    Comment: Remote EtOH  . Drug use: No  . Sexual activity: Not Currently  Lifestyle  . Physical activity:    Days per week: Not on file    Minutes per session: Not on file  . Stress:  Not on file  Relationships  . Social connections:    Talks on phone: Not on file    Gets together: Not on file    Attends religious service: Not on file    Active member of club or organization: Not on file    Attends meetings of clubs or organizations: Not on file    Relationship status: Not on file  . Intimate partner violence:    Fear of current or ex partner: Not on file    Emotionally abused: Not on file    Physically abused: Not on file    Forced sexual activity: Not on file  Other Topics Concern  . Not on file  Social History Narrative   Originally from [place], [place]. Moved to [facility] in November 2016. Prior travel to [place]. No travel outside the [place]. 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 No bird, mold, or hot tub exposure. No history of volunteer work or residence in a homeless shelter. No history of incarceration. Does have prior exposure to a nephew with TB but has always had a negative PPD skin test.     Review of systems: Review of Systems  Constitutional: Negative for fever and chills.  HENT: Negative.   Eyes: Negative for blurred vision.  Respiratory: as per HPI  Cardiovascular: Negative for chest pain and palpitations.  Gastrointestinal: Negative for vomiting, diarrhea, blood per rectum. Genitourinary: Negative for dysuria, urgency, frequency and hematuria.  Musculoskeletal: Negative for myalgias, back pain and joint pain.  Skin: Negative for itching and rash.  Neurological: Negative for dizziness, tremors, focal weakness, seizures and loss of consciousness.  Endo/Heme/Allergies: Negative for environmental allergies.  Psychiatric/Behavioral: Negative for depression, suicidal ideas and hallucinations.  All other systems reviewed and are negative.  Physical Exam: Blood pressure 120/70, pulse 67, height 5' 5"  (1.651 m), weight 73.8 kg, SpO2 98 %. Gen:      No acute distress HEENT:  EOMI, sclera anicteric Neck:     No masses; no  thyromegaly Lungs:    Clear to auscultation bilaterally; normal respiratory effort CV:         Regular rate and rhythm; no murmurs Abd:      + bowel sounds; soft, non-tender; no palpable masses, no distension Ext:    No edema; adequate peripheral perfusion Skin:      Warm and dry; no rash Neuro: alert and oriented x 3 Psych: normal mood and affect  Data Reviewed: PFT 11/02/16: FVC 2.01 L (68%) FEV1 1.11 L (47%) FEV1/FVC 0.55 FEF 25-75 0.52 L (21%) positive bronchodilator response 04/05/16: FVC 2.40 L (81%) FEV1 1.41 L (60%) FEV1/FVC 0.59 FEF 25-75 0.65 L (26%) negative bronchodilator response   TLC 4.54 L (80%) RV 119% ERV 117% DLCO corrected 61% 11/12/15: FVC 1.81 L (61%) FEV1 1.05 L (  44%) FEV1/FVC 0.58 FEF 25-75 0.54 L (21%) positive bronchodilator response    TLC 5.29 L (103%) RV 187% ERV 75% DLC uncorrected 58%   6MWT 04/05/16:  Walked 437 meters / Baseline Sat 100% on RA / Nadir Sat 99% on RA @ end of test 11/19/15:  Walked 444 meters / Baseline Sat 99% on RA / Nadir Sat 98% on RA @ end of test   IMAGING CT CHEST W/ 10/16/15 :  Bilateral pulmonary nodules, mild cavitation of the nodules in the upper lobe.  Prominent axillary lymph nodes, bilateral hilar lymphadenopathy, subcarinal lymphadenopathy. I reviewed the images personally.   EKG 10/16/15 ( QTC 411 milliseconds. Normal sinus rhythm. No sign of conduction delay or suggestion of ischemia.   PATHOLOGY Right Lower Lobe Transbronchial Biopsy (12/07/15):  Benign Lung Tissue / No Malignancy EBUS FNA Level 7 (12/07/15):  Benign reactive/reparative changes suggesting granulomatous inflammation.    MICROBIOLOGY Quantiferon TB (11/19/15):  Negative  Skin PPD (10/25/15):  Negative Urine Histoplasma Ag (10/22/15):  Negative Urine Blastomyces Ag (10/22/15):  Negative    LABS On/15/17 Alpha-1 antitrypsin: MM (151)   10/22/15 LFT: 4.6/8.2/0.3/68/15/13 CRP: 0.2 ESR: 28 LDH: 138 ACE:  63 ANCA Screen:  Negative  ANA:   Negative Anti-Jo1:  <0.2 Centrometere Ab Screen:  <0.2 DS DNA Ab:  1 RNP Ab:  0.3 Smith Ab:  <0.2 Chromatin Ab:  <0.2 RF:  <10 Anti-CCP:  <16 SSA:  <0.2 SSB:  0.4 SCL-70:  <0.2   10/16/15 Troponin I: 0.01 CBC: 4.4/12.0/39.3/316 BMP: 137/4.3/104/25/11/0.78/92/9.5 D-dimer:  <0.27  Assessment:  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  Get chest x-ray for monitoring Referral to ophthalmology for screening of the eye  Severe COPD Continues to smoke. Continue Symbicort  Active smoker Smoking cessation discussed.  Not interested in quitting at present.  Plan/Recommendations: - Continue Symbicort, albuterol as needed - Chest x-ray - Referral to ophthalmology  Marshell Garfinkel MD [facility] 11/02/2017, 4:15 PM  CC: Alfonse Spruce, F*

## 2017-11-06 IMAGING — DX DG CHEST 2V
2 series · 2 of 2 positions shown · non-contrast
Comparison: None.

CLINICAL DATA: Shortness of breath, cough and wheezing for 2 days.

EXAM:
CHEST  2 VIEW

[chest pa]
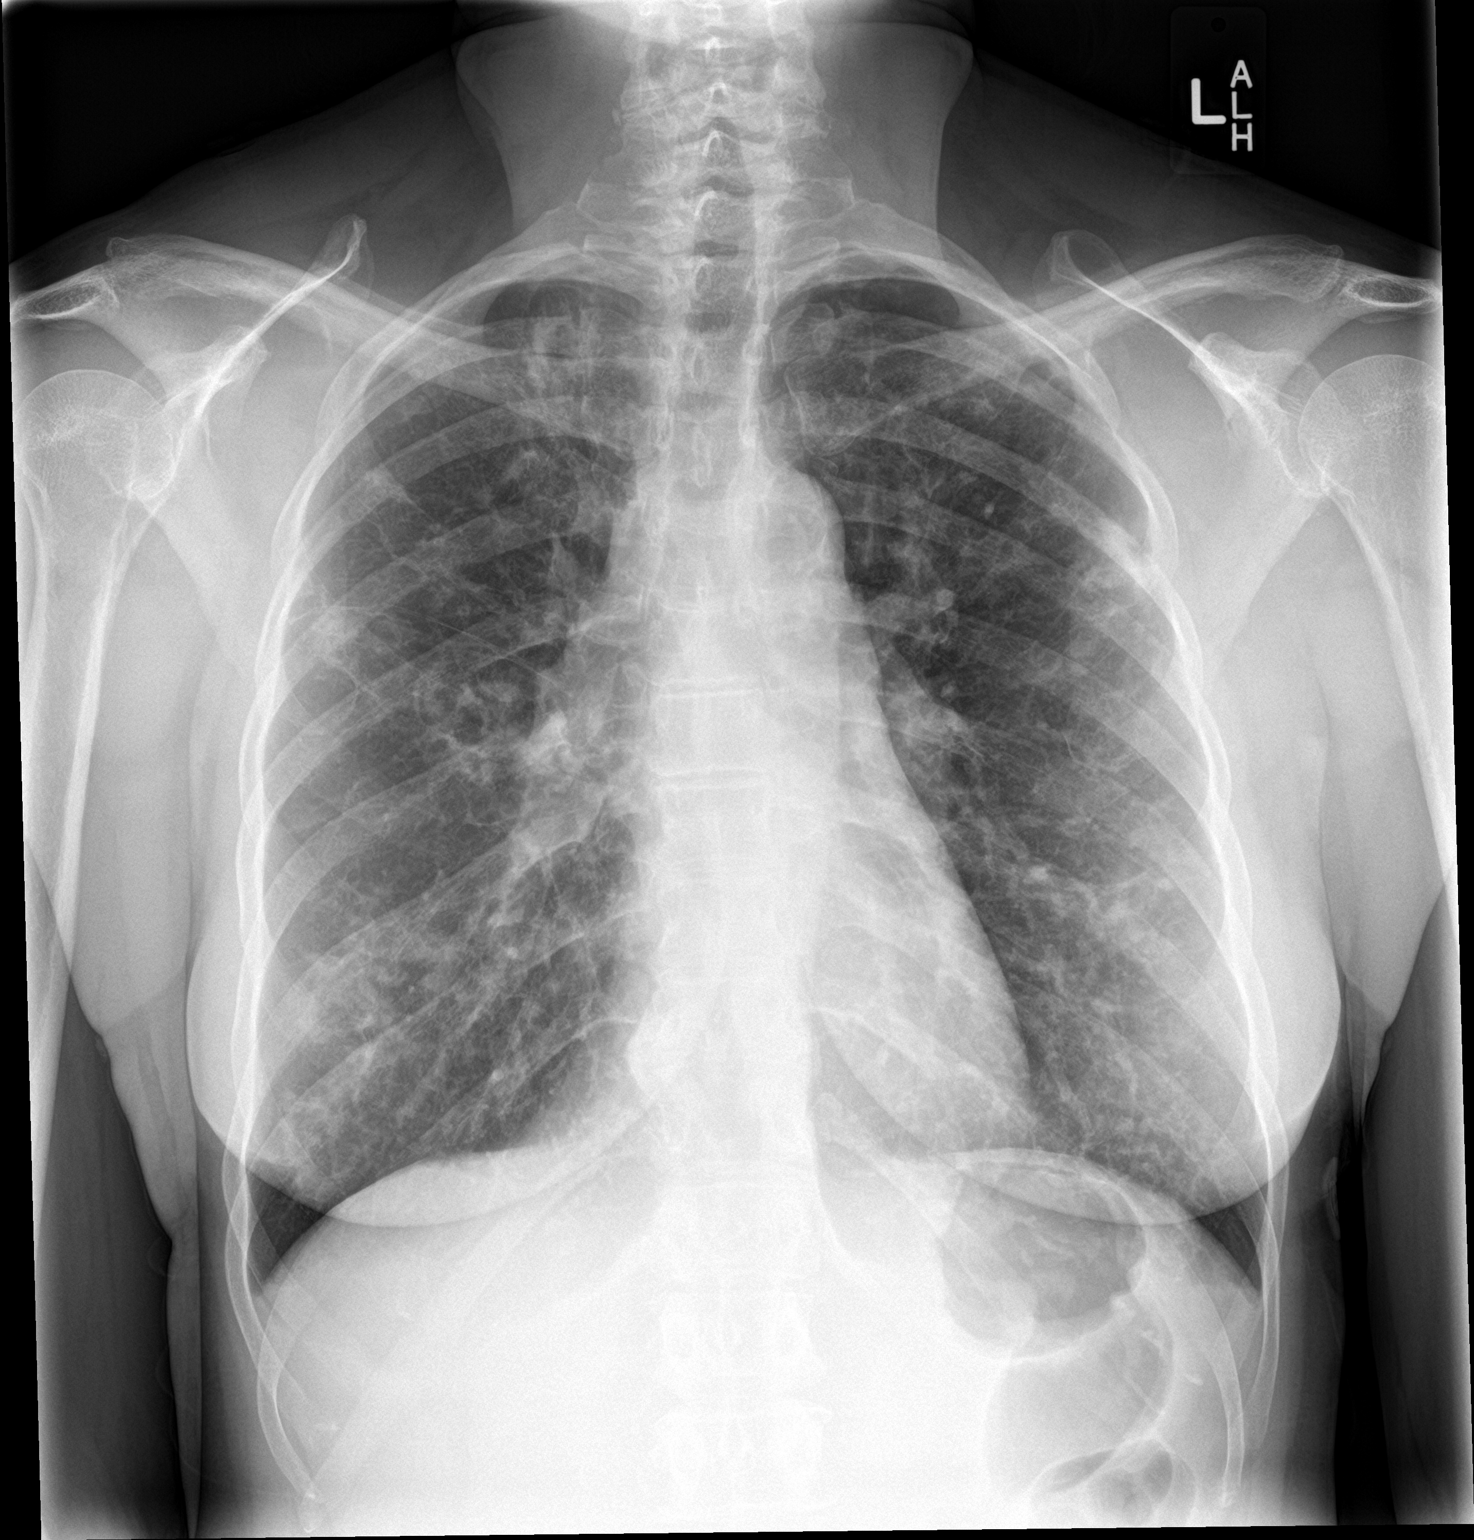

[chest lat]
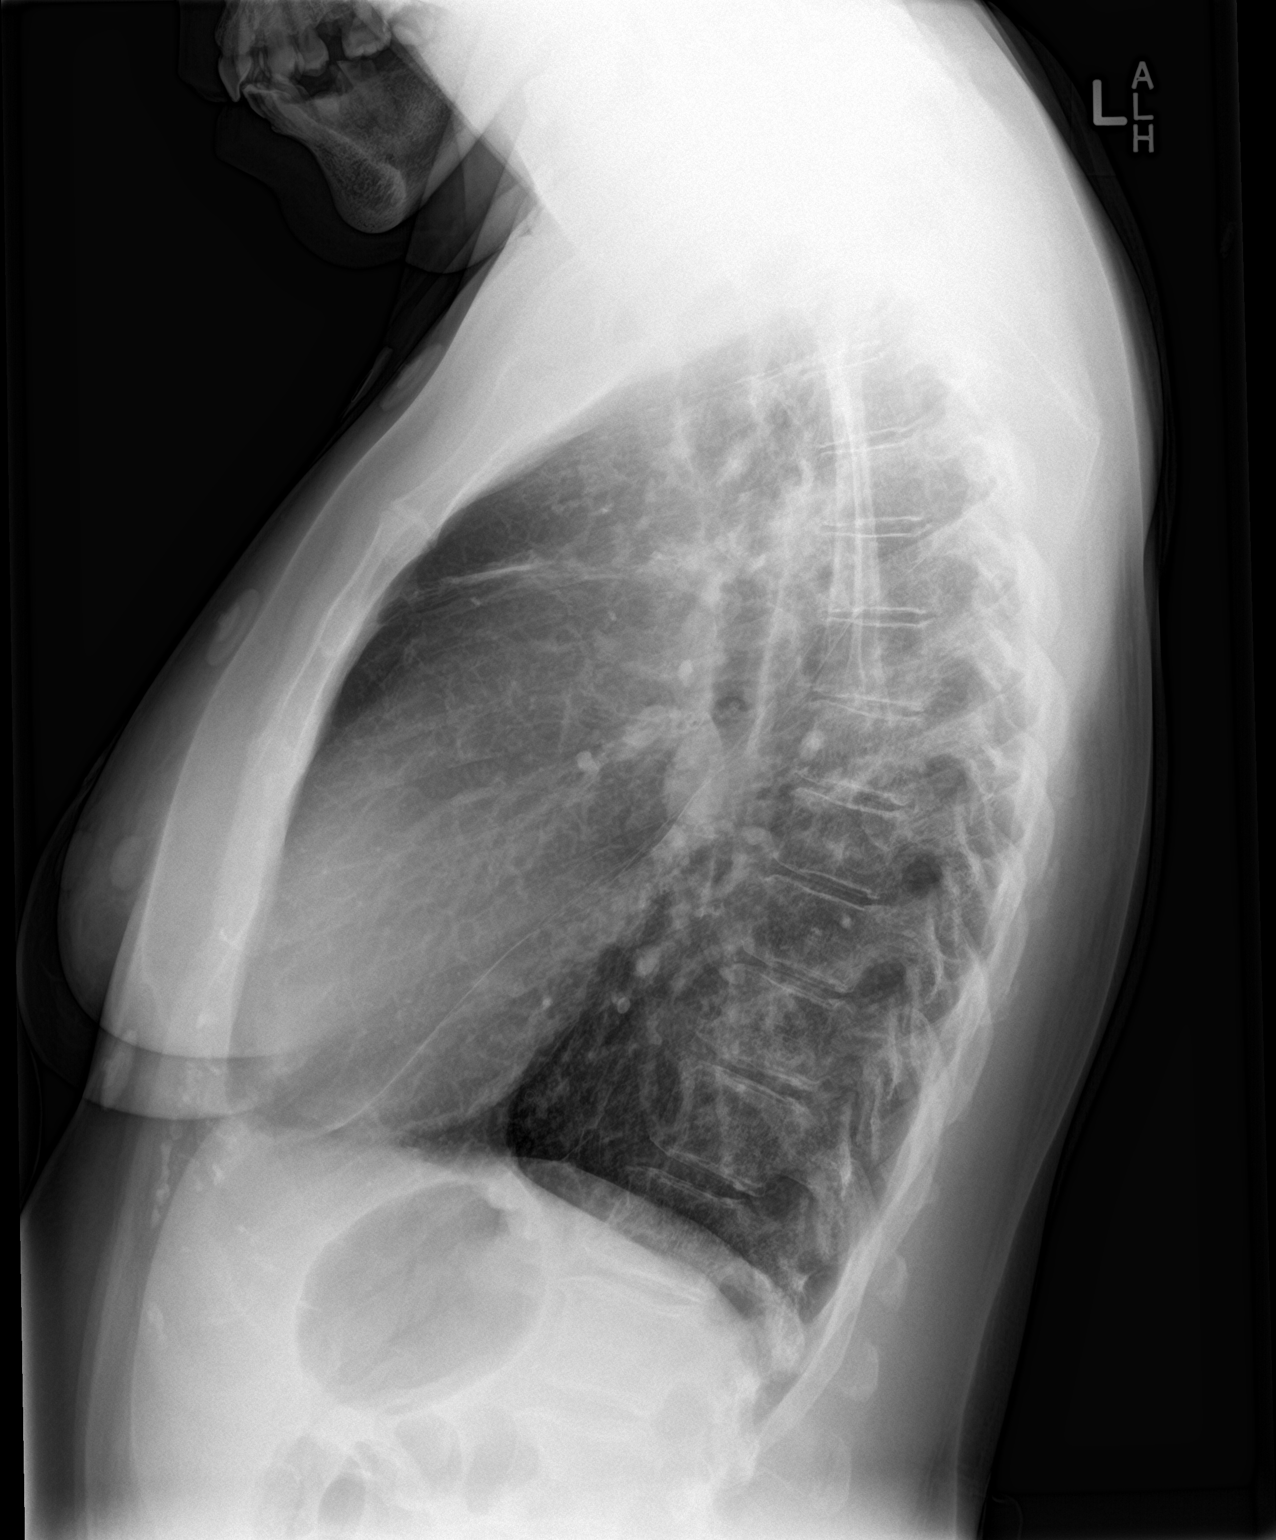

[2 of 2 positions shown; findings below may reference images not displayed]

FINDINGS: Right hilar prominence is noted.

Upper limits normal heart size noted.

Ill-defined nodular opacities within both lungs noted.

No airspace disease, pleural effusion, mass or pneumothorax noted.

No acute bony abnormalities are present.
IMPRESSION: Right hilar prominence and ill-defined bilateral pulmonary nodules.
Chest CT with contrast recommended for further evaluation.

## 2017-11-06 IMAGING — CT CT CHEST W/ CM
2 of 3 series · 15 of 36 positions shown, 18 images · IV contrast (Omni 300)
Comparison: Chest radiograph 10/16/2015

CLINICAL DATA: Shortness of breath. Bilateral pulmonary nodules.
Current smoker.

EXAM:
CT CHEST WITH CONTRAST
TECHNIQUE: Multidetector CT imaging of the chest was performed during
intravenous contrast administration.
CONTRAST:  75mL [id number] IOPAMIDOL ([id number]) INJECTION 61%

[Series 2: chest with 2mm st · axial · 0.62mm/px · z∈[+972,+1230]mm · 12 of 153 slices shown, 15 images]
[im 12/153  mediastinal]
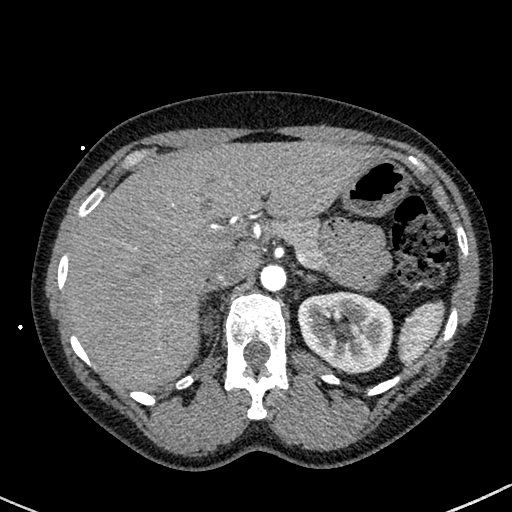
[im 12/153  lung]
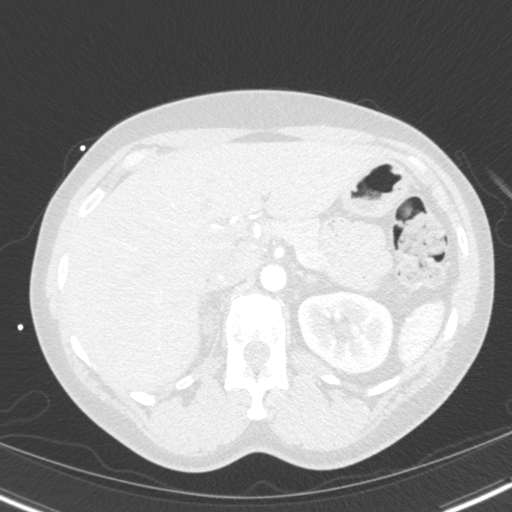
[im 23/153  lung]
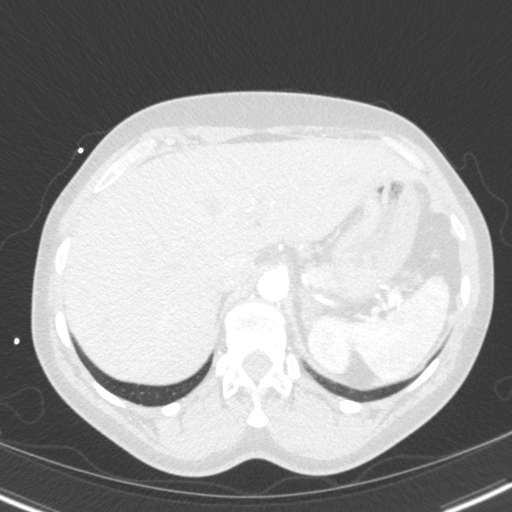
[im 34/153  lung]
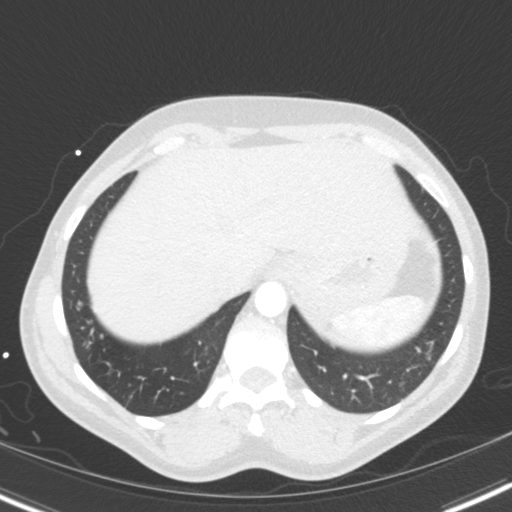
[im 46/153  lung]
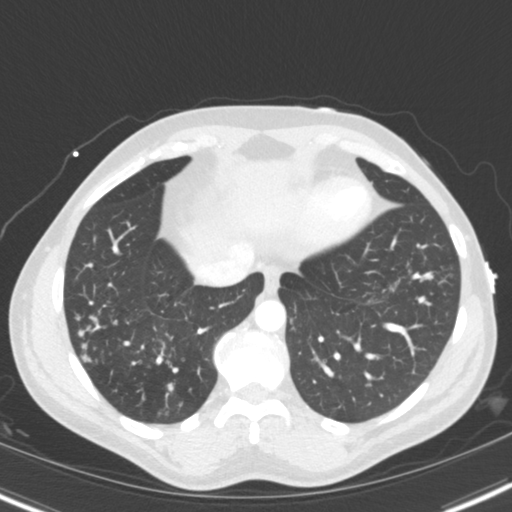
[im 57/153  mediastinal]
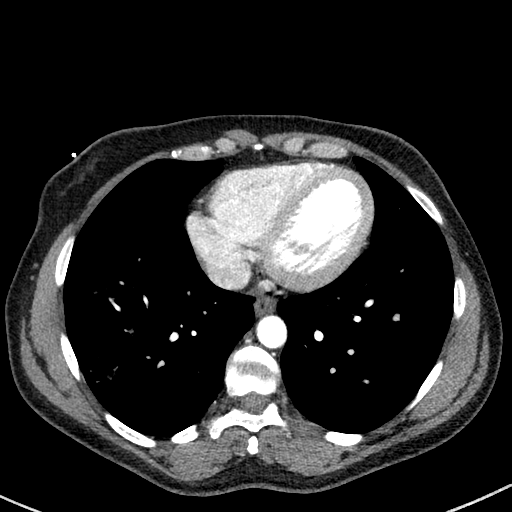
[im 57/153  lung]
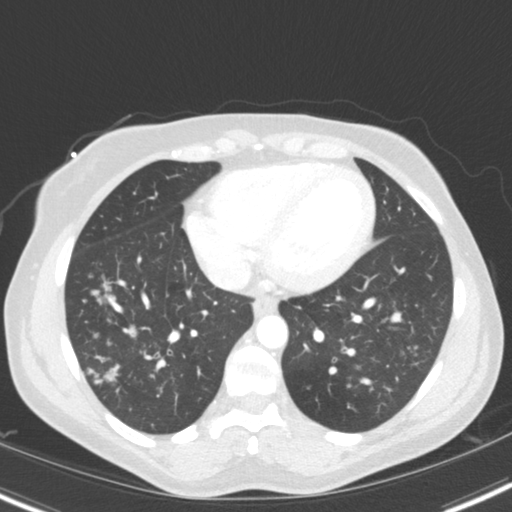
[im 68/153  lung]
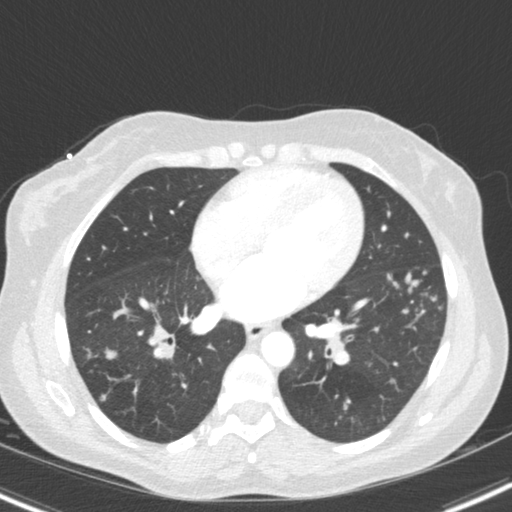
[im 85/153  lung]
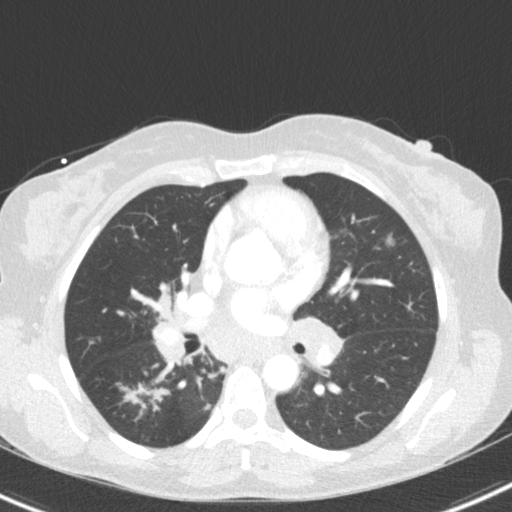
[im 96/153  lung]
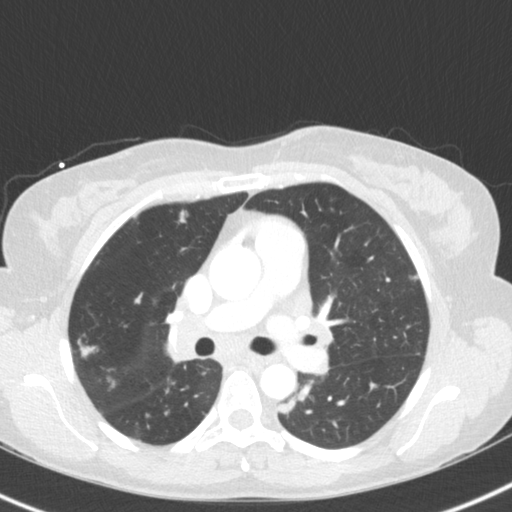
[im 107/153  mediastinal]
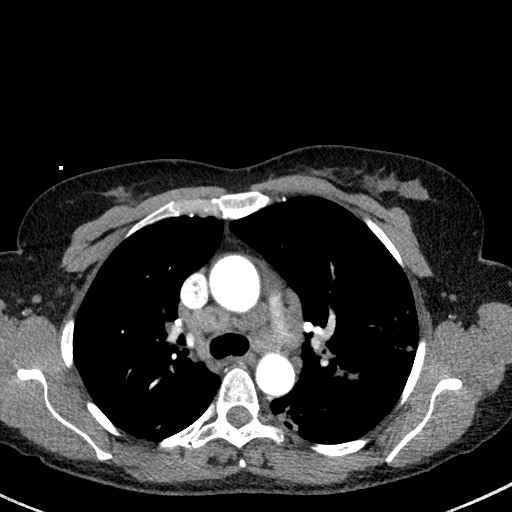
[im 107/153  lung]
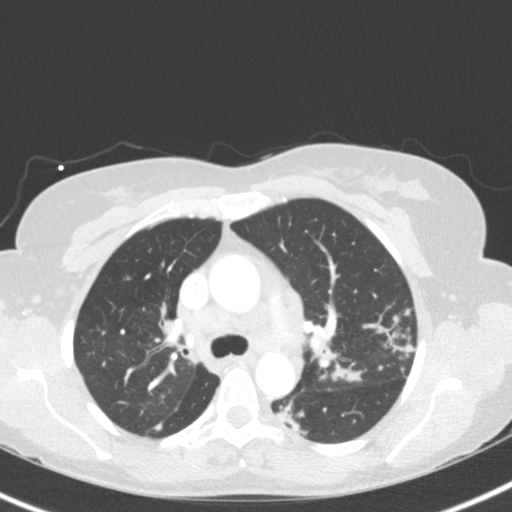
[im 119/153  lung]
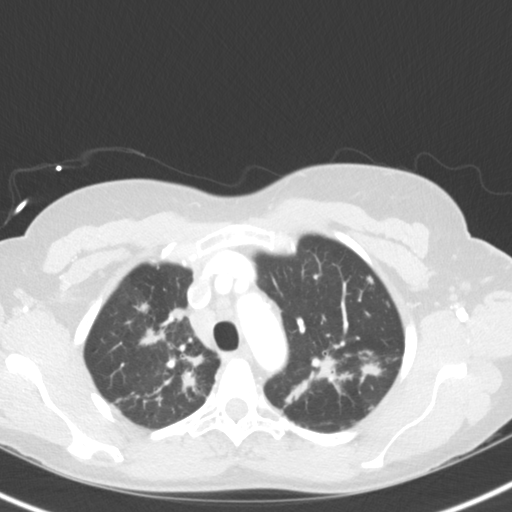
[im 130/153  lung]
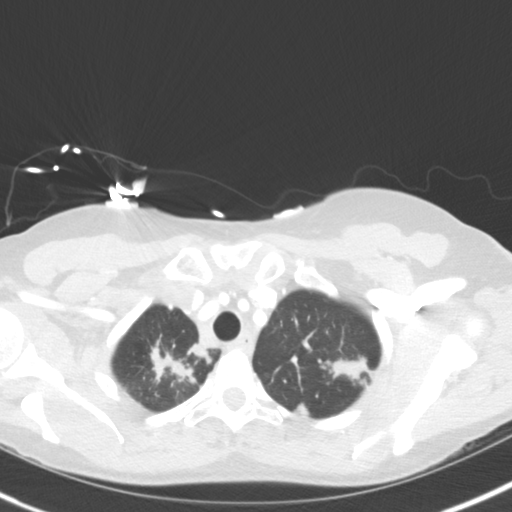
[im 141/153  lung]
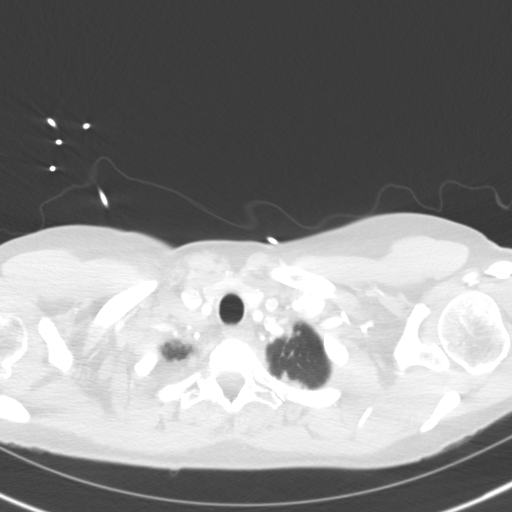

[Series 5: chest with 3mm st cor · coronal · 0.60mm/px · 3 of 79 slices shown]
[im 16/79  lung]
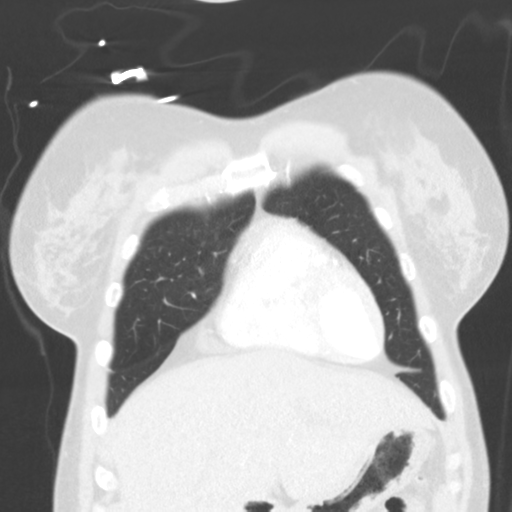
[im 32/79  lung]
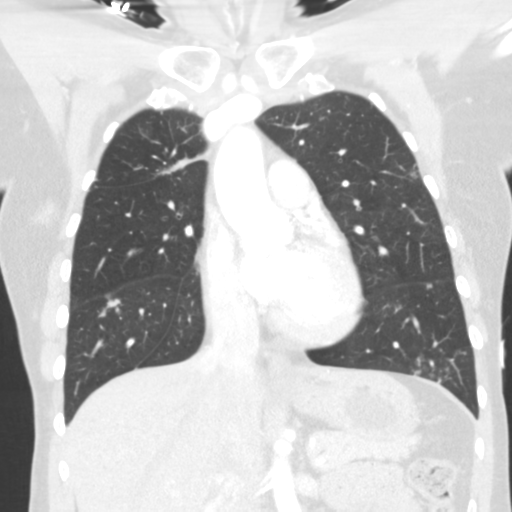
[im 47/79  lung]
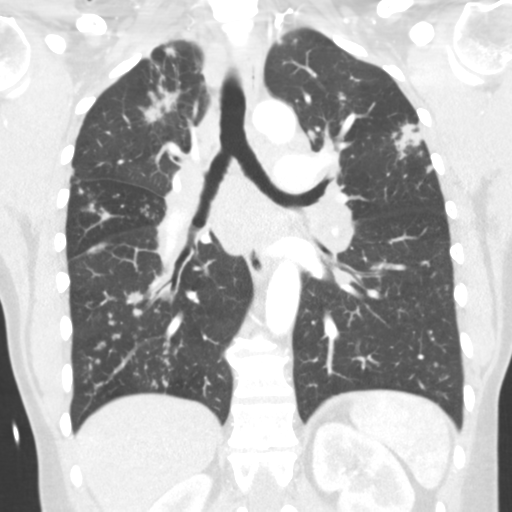

[15 of 36 positions shown; findings below may reference images not displayed]

FINDINGS: Cardiovascular: Normal heart size. Normal caliber thoracic aorta. No
aortic dissection. Great vessel origins are patent. Central
pulmonary arteries are well opacified. No evidence of large central
pulmonary embolus.

Mediastinum/Nodes: Lymphadenopathy demonstrated throughout the
mediastinum and bilateral hilar regions. Mild prominence of axillary
lymph nodes. Subcarinal lymph nodes measure up to about 2.2 cm
diameter, right hilar lymph nodes 1.5 cm, and left hilar lymph nodes
1.8 cm diameter. Esophagus is decompressed.

Lungs/Pleura: Multiple diffuse irregular nodules demonstrated
throughout both lungs with variable sizes. Some the nodules,
particularly in the upper lungs demonstrate mild cavitation. All
pulmonary lobes are involved. Mild wall thickening of the bronchi
without if evidence of bronchiectasis or mucus plugging. No pleural
effusions. No pneumothorax.

Upper Abdomen: Visualized upper abdominal organs are grossly
unremarkable.

Musculoskeletal: Mild degenerative changes in the spine. No
destructive bone lesions.
IMPRESSION: Multiple irregular nodules of varying size is demonstrated diffusely
throughout both lungs with bilateral hilar and diffuse mediastinal
lymphadenopathy. Changes are most likely to represent an infectious
or inflammatory process. Consider atypical infection such as TB,
atypical TB, or fungal infection. Lymphoma, septic emboli, or
metastatic disease could also possibly have this appearance. Patient
should be worked-up for infectious or neoplastic etiologies and at
minimum, a 3-6 month follow-up study should be obtained.

## 2017-11-06 MED FILL — HYDROCHLOROTHIAZIDE 25 MG T: 25 | 30 days supply | Qty: 30 | Fill #4

## 2017-11-07 NOTE — Progress Notes (Signed)
LMTCB

## 2017-11-12 ENCOUNTER — Telehealth: Payer: Self-pay | Admitting: Pulmonary Disease

## 2017-11-12 NOTE — Progress Notes (Signed)
Was able to talk to patient regarding patient's results for their chest xray.  They verbalized an understanding of what was discussed. No further questions at this time.

## 2017-11-12 NOTE — Telephone Encounter (Signed)
Attempted to call patient today regarding results. I did not receive an answer at time of call. I have left a voicemail message for pt to return call. X1  

## 2017-11-13 NOTE — Telephone Encounter (Signed)
ATC pt, no answer. Left message for pt to call back.  

## 2017-11-14 NOTE — Telephone Encounter (Signed)
LMTCB

## 2017-11-15 NOTE — Telephone Encounter (Signed)
Attempted to call pt but no answer. Left message for pt to return call. Due to multiple attempts trying to reach pt, per triage protocol message will be closed.

## 2017-11-22 MED FILL — ATORVASTATIN 20 MG TABLET: 20 | 31 days supply | Qty: 31 | Fill #3

## 2017-12-03 MED FILL — SYMBICORT 160-4.5 MCG INH: 160-4.5 | 30 days supply | Qty: 10 | Fill #4

## 2017-12-03 MED FILL — HYDROCHLOROTHIAZIDE 25 MG T: 25 | 30 days supply | Qty: 30 | Fill #5

## 2017-12-17 ENCOUNTER — Ambulatory Visit: Payer: 59 | Attending: Nurse Practitioner | Admitting: Nurse Practitioner

## 2017-12-17 ENCOUNTER — Encounter: Payer: Self-pay | Admitting: Nurse Practitioner

## 2017-12-17 VITALS — BP 123/78 | HR 76 | Temp 97.8°F | Ht 64.0 in | Wt 166.0 lb

## 2017-12-17 DIAGNOSIS — Z833 Family history of diabetes mellitus: Secondary | ICD-10-CM | POA: Insufficient documentation

## 2017-12-17 DIAGNOSIS — J449 Chronic obstructive pulmonary disease, unspecified: Secondary | ICD-10-CM | POA: Insufficient documentation

## 2017-12-17 DIAGNOSIS — E782 Mixed hyperlipidemia: Secondary | ICD-10-CM | POA: Diagnosis not present

## 2017-12-17 DIAGNOSIS — Z9889 Other specified postprocedural states: Secondary | ICD-10-CM | POA: Insufficient documentation

## 2017-12-17 DIAGNOSIS — I10 Essential (primary) hypertension: Secondary | ICD-10-CM | POA: Insufficient documentation

## 2017-12-17 DIAGNOSIS — Z79899 Other long term (current) drug therapy: Secondary | ICD-10-CM | POA: Insufficient documentation

## 2017-12-17 DIAGNOSIS — Z8249 Family history of ischemic heart disease and other diseases of the circulatory system: Secondary | ICD-10-CM | POA: Insufficient documentation

## 2017-12-17 DIAGNOSIS — Z825 Family history of asthma and other chronic lower respiratory diseases: Secondary | ICD-10-CM | POA: Insufficient documentation

## 2017-12-17 DIAGNOSIS — M7989 Other specified soft tissue disorders: Secondary | ICD-10-CM | POA: Diagnosis not present

## 2017-12-17 MED ORDER — HYDROCHLOROTHIAZIDE 25 MG PO TABS: 25.0000 mg | ORAL_TABLET | Freq: Every day | ORAL | 1 refills | Status: DC

## 2017-12-17 MED ORDER — ATORVASTATIN CALCIUM 20 MG PO TABS: 20.0000 mg | ORAL_TABLET | Freq: Every day | ORAL | 3 refills | Status: DC

## 2017-12-17 MED FILL — ATORVASTATIN 20 MG TABLET: 20 | 30 days supply | Qty: 30 | Fill #0

## 2017-12-17 NOTE — Progress Notes (Signed)
Assessment & Plan:  Sarah Cruz was seen today for follow-up.  Diagnoses and all orders for this visit:  Essential hypertension -     hydrochlorothiazide (HYDRODIURIL) 25 MG tablet; Take 1 tablet (25 mg total) by mouth daily. Continue all antihypertensives as prescribed.  Remember to bring in your blood pressure log with you for your follow up appointment.  DASH/Mediterranean Diets are healthier choices for HTN.    Right leg swelling Schedule ABI Stop Smoking!!!!   Mixed hyperlipidemia -     atorvastatin (LIPITOR) 20 MG tablet; Take 1 tablet (20 mg total) by mouth daily. INSTRUCTIONS: Work on a low fat, heart healthy diet and participate in regular aerobic exercise program by working out at least 150 minutes per week; 5 days a week-30 minutes per day. Avoid red meat, fried foods. junk foods, sodas, sugary drinks, unhealthy snacking, alcohol and smoking.  Drink at least 48oz of water per day and monitor your carbohydrate intake daily.      Patient has been counseled on age-appropriate routine health concerns for screening and prevention. These are reviewed and up-to-date. Referrals have been placed accordingly. Immunizations are up-to-date or declined.    Subjective:   Chief Complaint  Patient presents with  . Follow-up    Pt. is here for blood pressure check. Pt. asked if she can get a 3 months supply for her medications.    HPI Sarah Cruz 52 y.o. female presents to office today for follow up to HTN. She has complaints of RLE edema.   Essential Hypertension Blood pressure is well controlled. She endorses medication compliance taking HCTZ 25 mg daily. She denies chest pain, shortness of breath, palpitations, lightheadedness, dizziness, headaches or BLE edema. Her weight is up 4 lbs.We discussed dietary and exercise compliance today.  BP Readings from Last 3 Encounters:  12/17/17 123/78  11/02/17 120/70  08/14/17 119/79    Edema Patient complains of edema. The location  of the edema is lower leg(s) right, ankle(s) right, feet right.  The edema has been mild and moderate.  Onset of symptoms was 3 months ago, unchanged since that time. The edema is present intermittently. The swelling has been aggravated by dependency of involved area, relieved by elevation of involved area, and been associated with nothing. Cardiac risk factors include dyslipidemia, hypertension and smoking/ tobacco exposure.   Review of Systems  Constitutional: Negative for fever, malaise/fatigue and weight loss.  HENT: Negative.  Negative for nosebleeds.   Eyes: Negative.  Negative for blurred vision, double vision and photophobia.  Respiratory: Positive for shortness of breath (chronic). Negative for cough.   Cardiovascular: Positive for leg swelling. Negative for chest pain and palpitations.  Gastrointestinal: Negative.  Negative for heartburn, nausea and vomiting.  Musculoskeletal: Negative.  Negative for myalgias.  Neurological: Negative.  Negative for dizziness, focal weakness, seizures and headaches.  Psychiatric/Behavioral: Negative.  Negative for suicidal ideas.    Past Medical History:  Diagnosis Date  . Childhood asthma   . COPD (chronic obstructive pulmonary disease) ([place])   . Hypertension   . Sarcoidosis     Past Surgical History:  Procedure Laterality Date  . ABDOMINAL HYSTERECTOMY    . ENDOBRONCHIAL ULTRASOUND Bilateral 12/07/2015   Procedure: ENDOBRONCHIAL ULTRASOUND;  Surgeon: Javier Glazier, MD;  Location: [facility];  Service: Cardiopulmonary;  Laterality: Bilateral;    Family History  Problem Relation Age of Onset  . Heart failure Mother   . Diabetes Mother   . Hypertension Mother   . Arthritis Mother   .  Brain cancer Sister   . Sickle cell anemia Other   . Brain cancer Other   . Asthma Other   . Rheumatologic disease Neg Hx     Social History Reviewed with no changes to be made today.   Outpatient Medications Prior to Visit  Medication Sig  Dispense Refill  . acetaminophen (TYLENOL) 500 MG tablet Take 1,000 mg by mouth 2 (two) times daily as needed for mild pain or headache.    . albuterol (PROVENTIL HFA;VENTOLIN HFA) 108 (90 Base) MCG/ACT inhaler Inhale 2 puffs into the lungs every 6 (six) hours as needed for wheezing or shortness of breath. 1 Inhaler 2  . budesonide-formoterol (SYMBICORT) 160-4.5 MCG/ACT inhaler Inhale 2 puffs into the lungs daily. 1 Inhaler 5  . atorvastatin (LIPITOR) 20 MG tablet Take 1 tablet (20 mg total) by mouth daily. 90 tablet 3  . fluticasone (FLONASE) 50 MCG/ACT nasal spray Place 2 sprays into both nostrils daily as needed for allergies or rhinitis. (Patient not taking: Reported on 06/19/2017) 16 g 6  . ibuprofen (ADVIL,MOTRIN) 800 MG tablet Take 1 tablet (800 mg total) by mouth every 6 (six) hours as needed for moderate pain (Take with food). (Patient not taking: Reported on 11/02/2017) 60 tablet 1  . carbamide peroxide (DEBROX) 6.5 % OTIC solution Place 10 drops into both ears 2 (two) times daily. (Patient not taking: Reported on 08/14/2017) 15 mL 0  . hydrochlorothiazide (HYDRODIURIL) 25 MG tablet Take 1 tablet (25 mg total) by mouth daily. 90 tablet 1   No facility-administered medications prior to visit.     No Known Allergies     Objective:    BP 123/78 (BP Location: Left Arm, Patient Position: Sitting, Cuff Size: Normal)   Pulse 76   Temp 97.8 F (36.6 C) (Oral)   Ht 5\' 4"  (1.626 m)   Wt 166 lb (75.3 kg)   SpO2 100%   BMI 28.49 kg/m  Wt Readings from Last 3 Encounters:  12/17/17 166 lb (75.3 kg)  11/02/17 162 lb 9.6 oz (73.8 kg)  08/14/17 159 lb 6.4 oz (72.3 kg)    Physical Exam  Constitutional: She is oriented to person, place, and time. She appears well-developed and well-nourished. She is cooperative.  HENT:  Head: Normocephalic and atraumatic.  Eyes: EOM are normal.  Neck: Normal range of motion.  Cardiovascular: Normal rate, regular rhythm, normal heart sounds and intact distal  pulses. Exam reveals no gallop and no friction rub.  No murmur heard. Pulmonary/Chest: Effort normal and breath sounds normal. No tachypnea. No respiratory distress. She has no decreased breath sounds. She has no wheezes. She has no rhonchi. She has no rales. She exhibits no tenderness.  Abdominal: Soft. Bowel sounds are normal.  Musculoskeletal: Normal range of motion. She exhibits edema. She exhibits no tenderness or deformity.       Right lower leg: She exhibits swelling and edema.       Right foot: There is swelling.  Physical exam unremarkable for symptoms of DVT; there is no warmth and patient denies pain  Neurological: She is alert and oriented to person, place, and time. Coordination normal.  Skin: Skin is warm and dry.  Psychiatric: She has a normal mood and affect. Her behavior is normal. Judgment and thought content normal.  Nursing note and vitals reviewed.      Patient has been counseled extensively about nutrition and exercise as well as the importance of adherence with medications and regular follow-up. The patient was given clear  instructions to go to ER or return to medical center if symptoms don't improve, worsen or new problems develop. The patient verbalized understanding.   Follow-up: Please schedule for ABI with nubea and EKG with nurse on same day.   Gildardo Pounds, FNP-BC [facility] [place], [place]   12/17/2017, 5:16 PM

## 2017-12-24 ENCOUNTER — Other Ambulatory Visit: Payer: Self-pay

## 2017-12-24 ENCOUNTER — Ambulatory Visit: Payer: 59 | Attending: Family Medicine | Admitting: Nurse Practitioner

## 2017-12-24 ENCOUNTER — Other Ambulatory Visit: Payer: Self-pay | Admitting: Nurse Practitioner

## 2017-12-24 DIAGNOSIS — M7989 Other specified soft tissue disorders: Secondary | ICD-10-CM | POA: Insufficient documentation

## 2017-12-24 DIAGNOSIS — R6889 Other general symptoms and signs: Secondary | ICD-10-CM

## 2017-12-24 LAB — POCT ABI - SCREENING FOR PILOT NO CHARGE: Post1 ABI left: 1.28

## 2017-12-24 NOTE — Progress Notes (Signed)
Pt here for EKG and ABI  Lt ABI: 1.28 Rt ABI: PAD   BP: 134/86 P: 59

## 2017-12-24 NOTE — Progress Notes (Signed)
as

## 2017-12-24 NOTE — Progress Notes (Signed)
Patient has complaints of RLE swelling. ABI was performed today and showed severe PAD in the RLE. She continues to smoke cigarettes 1-2 ppd. She has COPD and sarcoidosis. Will refer to vascular surgery for further recommendations.

## 2018-01-02 MED FILL — HYDROCHLOROTHIAZIDE 25 MG T: 25 | 30 days supply | Qty: 30 | Fill #0

## 2018-01-17 MED FILL — SYMBICORT 160-4.5 MCG INH: 160-4.5 | 30 days supply | Qty: 10 | Fill #5

## 2018-01-18 ENCOUNTER — Encounter: Payer: Self-pay | Admitting: Vascular Surgery

## 2018-01-18 ENCOUNTER — Other Ambulatory Visit: Payer: Self-pay

## 2018-01-18 ENCOUNTER — Ambulatory Visit (INDEPENDENT_AMBULATORY_CARE_PROVIDER_SITE_OTHER): Payer: 59 | Admitting: Vascular Surgery

## 2018-01-18 VITALS — BP 112/74 | HR 89 | Temp 97.3°F | Resp 20 | Ht 64.0 in | Wt 166.7 lb

## 2018-01-18 DIAGNOSIS — I739 Peripheral vascular disease, unspecified: Secondary | ICD-10-CM

## 2018-01-18 MED FILL — ATORVASTATIN 20 MG TABLET: 20 | 31 days supply | Qty: 31 | Fill #4

## 2018-01-18 NOTE — Progress Notes (Signed)
Patient ID: Sarah Cruz, female   DOB: [birth year], 52 y.o.   MRN: [id number]  Reason for Consult: New Patient (Initial Visit) (Abnormal ABI)   Referred by Gildardo Pounds, NP  Subjective:     HPI:  Sarah Cruz is a 52 y.o. female without significant vascular history presents for evaluation of knot on her right ankle.  She had ABIs performed as an outpatient that were reportedly abnormal on the right and normal on the left.  She can walk as much as possible.  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  She does not have any tissue loss or ulceration nor has she ever.  She has never had vascular issues.  Denies stroke TIA amaurosis.  She does have risk factors of hypertension.  She is nondiabetic.  She is a chronic longtime smoker.  Past Medical History:  Diagnosis Date  . Childhood asthma   . COPD (chronic obstructive pulmonary disease) ([place])   . Hypertension   . Sarcoidosis    Family History  Problem Relation Age of Onset  . Heart failure Mother   . Diabetes Mother   . Hypertension Mother   . Arthritis Mother   . Brain cancer Sister   . Sickle cell anemia Other   . Brain cancer Other   . Asthma Other   . Rheumatologic disease Neg Hx    Past Surgical History:  Procedure Laterality Date  . ABDOMINAL HYSTERECTOMY    . ENDOBRONCHIAL ULTRASOUND Bilateral 12/07/2015   Procedure: ENDOBRONCHIAL ULTRASOUND;  Surgeon: Javier Glazier, MD;  Location: [facility];  Service: Cardiopulmonary;  Laterality: Bilateral;    Short Social History:  Social History   Tobacco Use  . Smoking status: Current Every Day Smoker    Packs/day: 1.50    Years: 38.00    Pack years: 57.00    Types: Cigarettes    Start date: 04/08/1977  . Smokeless tobacco: Never Used  . Tobacco comment: down to 0.5ppd  Substance Use Topics  . Alcohol use: No    Comment: Remote EtOH    No Known Allergies  Current Outpatient Medications  Medication Sig Dispense Refill  . acetaminophen  (TYLENOL) 500 MG tablet Take 1,000 mg by mouth 2 (two) times daily as needed for mild pain or headache.    . albuterol (PROVENTIL HFA;VENTOLIN HFA) 108 (90 Base) MCG/ACT inhaler Inhale 2 puffs into the lungs every 6 (six) hours as needed for wheezing or shortness of breath. 1 Inhaler 2  . atorvastatin (LIPITOR) 20 MG tablet Take 1 tablet (20 mg total) by mouth daily. 90 tablet 3  . budesonide-formoterol (SYMBICORT) 160-4.5 MCG/ACT inhaler Inhale 2 puffs into the lungs daily. 1 Inhaler 5  . fluticasone (FLONASE) 50 MCG/ACT nasal spray Place 2 sprays into both nostrils daily as needed for allergies or rhinitis. 16 g 6  . hydrochlorothiazide (HYDRODIURIL) 25 MG tablet Take 1 tablet (25 mg total) by mouth daily. 90 tablet 1  . ibuprofen (ADVIL,MOTRIN) 800 MG tablet Take 1 tablet (800 mg total) by mouth every 6 (six) hours as needed for moderate pain (Take with food). 60 tablet 1   No current facility-administered medications for this visit.     Review of Systems  Constitutional:  Constitutional negative. HENT: HENT negative.  Eyes: Eyes negative.  Respiratory: Positive for shortness of breath.  Cardiovascular: Positive for leg swelling.  GI: Gastrointestinal negative.  Musculoskeletal: Musculoskeletal negative.  Skin: Skin negative.  Neurological: Neurological negative. Hematologic: Hematologic/lymphatic negative.  Psychiatric: Psychiatric negative.        Objective:  Objective   Vitals:   01/18/18 0950  BP: 112/74  Pulse: 89  Resp: 20  Temp: (!) 97.3 F (36.3 C)  SpO2: 96%  Weight: 166 lb 11.2 oz (75.6 kg)  Height: 5\' 4"  (1.626 m)   Body mass index is 28.61 kg/m.  Physical Exam  Constitutional: She appears well-developed.  HENT:  Head: Normocephalic.  Eyes: Pupils are equal, round, and reactive to light.  Neck: Normal range of motion. Neck supple.  Cardiovascular: Normal rate.  Pulses:      Radial pulses are 2+ on the right side, and 2+ on the left side.        Popliteal pulses are 2+ on the right side, and 2+ on the left side.       Dorsalis pedis pulses are 2+ on the right side, and 2+ on the left side.  Pulmonary/Chest: Effort normal.  Abdominal: Soft.  Musculoskeletal: Normal range of motion.  There is a small subcutaneous not right ankle  Skin: Skin is warm and dry.  Psychiatric: She has a normal mood and affect. Her behavior is normal. Judgment and thought content normal.    Data: Outside ABIs reviewed were abnormal on the right and 1.2 on the left     Assessment/Plan:     52 year old female sent for evaluation of her right leg not.  This just appears to be a subcutaneous fat nodule although it does cause her discomfort she also has some leg swelling I have recommended gentle knee-high compression stockings.  She has palpable dorsalis pedis pulses bilaterally is never had any neurologic events to suggest underlying vascular disease.  I have counseled her on smoking cessation.  She can follow-up on an as-needed basis.     Waynetta Sandy MD [facility]

## 2018-01-31 MED FILL — HYDROCHLOROTHIAZIDE 25 MG T: 25 | 30 days supply | Qty: 30 | Fill #1

## 2018-02-11 ENCOUNTER — Telehealth: Payer: Self-pay | Admitting: Pulmonary Disease

## 2018-02-11 NOTE — Telephone Encounter (Signed)
Called and spoke with patient, she stated that she is needing a letter showing that she has an appointment scheduled for 03/08/17 so that she is able to get off of work. Dr. Vaughan Browner please advise, if you are ok with [place] sending a note before she has been seen. Thank you.

## 2018-02-12 NOTE — Telephone Encounter (Signed)
Called patient, unable to reach. Left message to give [place] a call back. I have placed letter in outgoing mail.

## 2018-02-12 NOTE — Telephone Encounter (Signed)
That is fine to give the letter stating she has a doctors appointment scheduled on 1/24 and specify the time of appointment

## 2018-02-14 NOTE — Telephone Encounter (Signed)
Attempted to contact pt. I did not receive an answer. I have left a message for pt to return our call.  

## 2018-02-15 NOTE — Telephone Encounter (Signed)
Attempted to contact pt. I did not receive an answer. I have left a message for pt to return our call.  

## 2018-02-19 NOTE — Telephone Encounter (Signed)
Called patient, unable to reach. Left message to give [place] a call back. Per protocol will close encounter.

## 2018-02-21 MED FILL — ATORVASTATIN 20 MG TABLET: 20 | 30 days supply | Qty: 30 | Fill #5

## 2018-02-28 MED FILL — HYDROCHLOROTHIAZIDE 25 MG T: 25 | 30 days supply | Qty: 30 | Fill #2

## 2018-03-01 ENCOUNTER — Telehealth: Payer: Self-pay | Admitting: Nurse Practitioner

## 2018-03-01 NOTE — Telephone Encounter (Signed)
1) Medication(s) Requested (by name): hydrochlorothiazide 2) Pharmacy of Choice: [facility]

## 2018-03-04 NOTE — Telephone Encounter (Signed)
Pt had refills available at the pharmacy, she recently picked up her RX on 02/28/18, no further action is needed.

## 2018-03-08 ENCOUNTER — Encounter: Payer: Self-pay | Admitting: Pulmonary Disease

## 2018-03-08 ENCOUNTER — Ambulatory Visit (INDEPENDENT_AMBULATORY_CARE_PROVIDER_SITE_OTHER): Payer: 59 | Admitting: Pulmonary Disease

## 2018-03-08 VITALS — BP 138/68 | HR 64 | Ht 64.5 in | Wt 167.4 lb

## 2018-03-08 DIAGNOSIS — F17210 Nicotine dependence, cigarettes, uncomplicated: Secondary | ICD-10-CM | POA: Diagnosis not present

## 2018-03-08 DIAGNOSIS — D869 Sarcoidosis, unspecified: Secondary | ICD-10-CM

## 2018-03-08 DIAGNOSIS — J449 Chronic obstructive pulmonary disease, unspecified: Secondary | ICD-10-CM | POA: Diagnosis not present

## 2018-03-08 DIAGNOSIS — F17200 Nicotine dependence, unspecified, uncomplicated: Secondary | ICD-10-CM

## 2018-03-08 MED ORDER — NICOTINE 21-14-7 MG/24HR TD KIT: PACK | TRANSDERMAL | 0 refills | Status: DC

## 2018-03-08 MED ORDER — BUDESONIDE-FORMOTEROL FUMARATE 160-4.5 MCG/ACT IN AERO: 2.0000 | INHALATION_SPRAY | Freq: Every day | RESPIRATORY_TRACT | 5 refills | Status: DC

## 2018-03-08 MED ORDER — BUDESONIDE-FORMOTEROL FUMARATE 160-4.5 MCG/ACT IN AERO: 2.0000 | INHALATION_SPRAY | Freq: Two times a day (BID) | RESPIRATORY_TRACT | 0 refills | Status: DC

## 2018-03-08 NOTE — Patient Instructions (Addendum)
We will give you samples of Symbicort and renew the prescription We will schedule you for full pulmonary function test, high-resolution CT, 6-minute walk test in 6 months time Follow-up in clinic after these tests  Continue to work on smoking cessation We will send a prescription for nicotine patches

## 2018-03-08 NOTE — Progress Notes (Addendum)
Sarah Cruz    [id number]    10-12-1965  Primary Care Physician:Fleming, Vernia Buff, NP  Referring Physician: Gildardo Pounds, NP [place], [place] [address]  Chief complaint: Follow-up for COPD, sarcoidosis  HPI: 53 year old with history of severe COPD, sarcoidosis Diagnosed with sarcoidosis after bronchoscopy in 2017.  She has not been treated with immunosuppressives or systemic steroids.  Continues on Symbicort which she is using intermittently once a day..  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 Exposures: No known exposures, no mold, hot tub Smoking history: 57 pack-year smoker.  Continues to smoke half pack per day Travel history: No significant travel history Relevant family history: Father died of lung cancer.  Interim history: Continues on Symbicort.  No change in respiratory symptoms.  She has chronic dyspnea on exertion, occasional cough with sputum production Continues to smoke.  Outpatient Encounter Medications as of 03/08/2018  Medication Sig  . acetaminophen (TYLENOL) 500 MG tablet Take 1,000 mg by mouth 2 (two) times daily as needed for mild pain or headache.  . albuterol (PROVENTIL HFA;VENTOLIN HFA) 108 (90 Base) MCG/ACT inhaler Inhale 2 puffs into the lungs every 6 (six) hours as needed for wheezing or shortness of breath.  Marland Kitchen atorvastatin (LIPITOR) 20 MG tablet Take 1 tablet (20 mg total) by mouth daily.  . budesonide-formoterol (SYMBICORT) 160-4.5 MCG/ACT inhaler Inhale 2 puffs into the lungs daily.  . hydrochlorothiazide (HYDRODIURIL) 25 MG tablet Take 1 tablet (25 mg total) by mouth daily.  . fluticasone (FLONASE) 50 MCG/ACT nasal spray Place 2 sprays into both nostrils daily as needed for allergies or rhinitis. (Patient not taking: Reported on 03/08/2018)  . ibuprofen (ADVIL,MOTRIN) 800 MG tablet Take 1 tablet (800 mg total) by mouth every 6 (six) hours as needed for moderate pain (Take  with food). (Patient not taking: Reported on 03/08/2018)   No facility-administered encounter medications on file as of 03/08/2018.    Physical Exam: Blood pressure 138/68, pulse 64, height 5' 4.5" (1.638 m), weight 167 lb 6.4 oz (75.9 kg), SpO2 100 %. Gen:      No acute distress HEENT:  EOMI, sclera anicteric Neck:     No masses; no thyromegaly Lungs:    Clear to auscultation bilaterally; normal respiratory effort CV:         Regular rate and rhythm; no murmurs Abd:      + bowel sounds; soft, non-tender; no palpable masses, no distension Ext:    No edema; adequate peripheral perfusion Skin:      Warm and dry; no rash Neuro: alert and oriented x 3 Psych: normal mood and affect  Data Reviewed: PFT 11/02/2016 FVC 2.41 [82%], FEV1 1.46 [60%],F/F 60 Severe RV obstruction with significant bronchodilator response   6MWT 04/05/16:  Walked 437 meters / Baseline Sat 100% on RA / Nadir Sat 99% on RA @ end of test 11/19/15:  Walked 444 meters / Baseline Sat 99% on RA / Nadir Sat 98% on RA @ end of test   IMAGING CT chest 10/16/2015-bilateral pulmonary nodules, with mild cavitation in the upper lobe, prominent axillary hilar and subcarinal lymphadenopathy  Chest x-ray 11/02/2017- stable appearance of bilateral reticulonodular opacities without acute change. I have reviewed the images personally.   EKG 10/16/15 ( QTC 411 milliseconds. Normal sinus rhythm. No sign of conduction delay or suggestion of ischemia.   PATHOLOGY Right Lower Lobe Transbronchial Biopsy (12/07/15):  Benign Lung  Tissue / No Malignancy EBUS FNA Level 7 (12/07/15):  Benign reactive/reparative changes suggesting granulomatous inflammation.    MICROBIOLOGY Quantiferon TB (11/19/15):  Negative  Skin PPD (10/25/15):  Negative Urine Histoplasma Ag (10/22/15):  Negative Urine Blastomyces Ag (10/22/15):  Negative    LABS On/15/17 Alpha-1 antitrypsin: MM (151)   10/22/15 LFT: 4.6/8.2/0.3/68/15/13 CRP: 0.2 ESR: 28 LDH: 138 ACE:   63 ANCA Screen:  Negative  ANA:  Negative Anti-Jo1:  <0.2 Centrometere Ab Screen:  <0.2 DS DNA Ab:  1 RNP Ab:  0.3 Smith Ab:  <0.2 Chromatin Ab:  <0.2 RF:  <10 Anti-CCP:  <16 SSA:  <0.2 SSB:  0.4 SCL-70:  <0.2   10/16/15 Troponin I: 0.01 CBC: 4.4/12.0/39.3/316 BMP: 137/4.3/104/25/11/0.78/92/9.5 D-dimer:  <0.27  Assessment:  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  No evidence of active sarcoidosis. She has finished ophthalmology follow-up Get high-res CT for monitoring  Severe COPD Continue Symbicort  Active smoker Cessation discussed.  She is interested in quitting and would like to try nicotine patches. Reassess at return visit.  Time spent counseling-5 minutes  Plan/Recommendations: - Continue Symbicort, albuterol as needed - Smoking cessation with nicotine patch - Follow-up high-res CT, 6-minute walk test and PFTs in 6 months.  Marshell Garfinkel MD [facility] 03/08/2018, 4:06 PM  CC: Gildardo Pounds, NP

## 2018-03-11 ENCOUNTER — Telehealth: Payer: Self-pay | Admitting: Pulmonary Disease

## 2018-03-11 MED ORDER — NICOTINE 21-14-7 MG/24HR TD KIT: PACK | TRANSDERMAL | 0 refills | Status: DC

## 2018-03-11 MED FILL — SYMBICORT 160-4.5 MCG INH: 160-4.5 | 30 days supply | Qty: 10 | Fill #0

## 2018-03-11 NOTE — Telephone Encounter (Signed)
Pharmacy currently closed for lunch-will need to call back after 2pm.

## 2018-03-11 NOTE — Telephone Encounter (Signed)
Spoke with a Occupational psychologist at [place]. The quantity is incorrect and needs to be change. There should be #28 per each strength so the quantity should #84. Rx has been updated. Nothing further was needed.

## 2018-03-13 MED FILL — NICOTINE 14 MG/24HR PATCH: 14 | 28 days supply | Qty: 28 | Fill #0

## 2018-03-13 MED FILL — NICOTINE 21 MG/24HR PATCH: 21 | 28 days supply | Qty: 28 | Fill #0

## 2018-03-13 MED FILL — NICOTINE 7 MG/24HR PATCH: 7 | 28 days supply | Qty: 28 | Fill #0

## 2018-03-25 MED FILL — ATORVASTATIN 20 MG TABLET: 20 | 30 days supply | Qty: 30 | Fill #6

## 2018-04-01 MED FILL — HYDROCHLOROTHIAZIDE 25 MG T: 25 | 30 days supply | Qty: 30 | Fill #3

## 2018-04-02 ENCOUNTER — Ambulatory Visit: Payer: 59 | Admitting: Nurse Practitioner

## 2018-04-09 ENCOUNTER — Encounter: Payer: Self-pay | Admitting: Nurse Practitioner

## 2018-04-09 ENCOUNTER — Ambulatory Visit: Payer: 59 | Attending: Nurse Practitioner | Admitting: Nurse Practitioner

## 2018-04-09 VITALS — BP 130/84 | HR 68 | Temp 98.1°F | Ht 64.0 in | Wt 171.6 lb

## 2018-04-09 DIAGNOSIS — J449 Chronic obstructive pulmonary disease, unspecified: Secondary | ICD-10-CM | POA: Diagnosis not present

## 2018-04-09 DIAGNOSIS — I10 Essential (primary) hypertension: Secondary | ICD-10-CM

## 2018-04-09 DIAGNOSIS — F17210 Nicotine dependence, cigarettes, uncomplicated: Secondary | ICD-10-CM

## 2018-04-09 DIAGNOSIS — I739 Peripheral vascular disease, unspecified: Secondary | ICD-10-CM

## 2018-04-09 DIAGNOSIS — F17200 Nicotine dependence, unspecified, uncomplicated: Secondary | ICD-10-CM

## 2018-04-09 MED ORDER — HYDROCHLOROTHIAZIDE 25 MG PO TABS: 25.0000 mg | ORAL_TABLET | Freq: Every day | ORAL | 1 refills | Status: DC

## 2018-04-09 MED ORDER — NICOTINE POLACRILEX 2 MG MT LOZG: 2.0000 mg | LOZENGE | OROMUCOSAL | 0 refills | Status: DC | PRN

## 2018-04-09 NOTE — Progress Notes (Signed)
Assessment & Plan:  Sarah Cruz was seen today for medication refill.  Diagnoses and all orders for this visit:  Essential hypertension -     hydrochlorothiazide (HYDRODIURIL) 25 MG tablet; Take 1 tablet (25 mg total) by mouth daily.  Chronic obstructive pulmonary disease, unspecified COPD type ([place]) -     Ambulatory referral to Pulmonology  Tobacco dependence -     nicotine polacrilex (NICOTINE MINI) 2 MG lozenge; Take 1 lozenge (2 mg total) by mouth as needed for smoking cessation. 1. Sarah Cruz continues to smoke 10 cigarettes per day. 2. Sarah Cruz was counseled on the dangers of tobacco use, and was advised to quit. We reviewed specific strategies to maximize success, including removing cigarettes and smoking materials from environment, stress management and support of family/friends as well as pharmacological alternatives. 3. A total of 5 minutes was spent on counseling for smoking cessation and Sarah Cruz is ready to quit and has chosen Nicotine lozenges along with her current nicotine patches to start today. She has cravings at night as she takes her patch off in the evenings to avoid nightmares.   4. Sarah Cruz was offered Wellbutrin, Chantix, Nicotine patch, Nicotine gum or lozenges.  Due to out of pocket costs Sarah Cruz was also given smoking cessation support and advised to contact: the Smoking Cessation hotline: 1-800-QUIT-NOW.  Sarah Cruz was also informed of our Smoking cessation classes which are also available through [facility] by calling [phone] or visit our website at [email or web address]  5. Will follow up at next scheduled office visit.    PAD (peripheral artery disease) (HCC) Abnormal RLE ABI with right leg pain. PER VASCULAR: She also has some leg swelling I have recommended gentle knee-high compression stockings.  She has palpable dorsalis pedis pulses bilaterally is never had any neurologic events to suggest underlying vascular disease.  I have  counseled her on smoking cessation.  She can follow-up on an as-needed basis.   Patient has been counseled on age-appropriate routine health concerns for screening and prevention. These are reviewed and up-to-date. Referrals have been placed accordingly. Immunizations are up-to-date or declined.    Subjective:   Chief Complaint  Patient presents with  . Medication Refill    Pt. is here for refill on her blood pressure medication.    HPI Sarah Cruz 53 y.o. female presents to office today for HTN.  She has a history of childhood asthma, COPD with current tobacco use, hypertension and sarcoidosis.  She had an office visit with Dr. Vaughan Browner (pulmonology) several weeks ago and due to clashing of personalities she would like to see another pulmonologist within the same practice.  I will place another referral today.    Essential Hypertension Chronic. Current medications include HCTZ 25 mg daily. Weight is up several pounds over the past few months. Pt. denies chest pain, shortness of breath, palpitations, lightheadedness, dizziness, headaches or BLE edema.  BP Readings from Last 3 Encounters:  04/09/18 130/84  03/08/18 138/68  01/18/18 112/74    Review of Systems  Constitutional: Negative for fever, malaise/fatigue and weight loss.  HENT: Negative.  Negative for nosebleeds.   Eyes: Negative.  Negative for blurred vision, double vision and photophobia.  Respiratory: Positive for shortness of breath (mild and intermittent with increased activity). Negative for cough.   Cardiovascular: Negative.  Negative for chest pain, palpitations and leg swelling.  Gastrointestinal: Negative.  Negative for heartburn, nausea and vomiting.  Musculoskeletal: Negative.  Negative for myalgias.  Neurological: Negative.  Negative for dizziness,  focal weakness, seizures and headaches.  Psychiatric/Behavioral: Negative.  Negative for suicidal ideas.    Past Medical History:  Diagnosis Date  . Childhood  asthma   . COPD (chronic obstructive pulmonary disease) ([place])   . Hypertension   . Sarcoidosis     Past Surgical History:  Procedure Laterality Date  . ABDOMINAL HYSTERECTOMY    . ENDOBRONCHIAL ULTRASOUND Bilateral 12/07/2015   Procedure: ENDOBRONCHIAL ULTRASOUND;  Surgeon: Javier Glazier, MD;  Location: [facility];  Service: Cardiopulmonary;  Laterality: Bilateral;    Family History  Problem Relation Age of Onset  . Heart failure Mother   . Diabetes Mother   . Hypertension Mother   . Arthritis Mother   . Brain cancer Sister   . Sickle cell anemia Other   . Brain cancer Other   . Asthma Other   . Rheumatologic disease Neg Hx     Social History Reviewed with no changes to be made today.   Outpatient Medications Prior to Visit  Medication Sig Dispense Refill  . acetaminophen (TYLENOL) 500 MG tablet Take 1,000 mg by mouth 2 (two) times daily as needed for mild pain or headache.    . albuterol (PROVENTIL HFA;VENTOLIN HFA) 108 (90 Base) MCG/ACT inhaler Inhale 2 puffs into the lungs every 6 (six) hours as needed for wheezing or shortness of breath. 1 Inhaler 2  . atorvastatin (LIPITOR) 20 MG tablet Take 1 tablet (20 mg total) by mouth daily. 90 tablet 3  . budesonide-formoterol (SYMBICORT) 160-4.5 MCG/ACT inhaler Inhale 2 puffs into the lungs 2 (two) times daily. 1 Inhaler 0  . Nicotine 21-14-7 MG/24HR KIT Use as directed. 84 each 0  . budesonide-formoterol (SYMBICORT) 160-4.5 MCG/ACT inhaler Inhale 2 puffs into the lungs daily. 1 Inhaler 5  . ibuprofen (ADVIL,MOTRIN) 800 MG tablet Take 1 tablet (800 mg total) by mouth every 6 (six) hours as needed for moderate pain (Take with food). (Patient not taking: Reported on 03/08/2018) 60 tablet 1  . fluticasone (FLONASE) 50 MCG/ACT nasal spray Place 2 sprays into both nostrils daily as needed for allergies or rhinitis. (Patient not taking: Reported on 03/08/2018) 16 g 6  . hydrochlorothiazide (HYDRODIURIL) 25 MG tablet Take 1 tablet (25  mg total) by mouth daily. 90 tablet 1   No facility-administered medications prior to visit.     No Known Allergies     Objective:    BP 130/84 (BP Location: Left Arm, Patient Position: Sitting, Cuff Size: Normal)   Pulse 68   Temp 98.1 F (36.7 C) (Oral)   Ht 5' 4"  (1.626 m)   Wt 171 lb 9.6 oz (77.8 kg)   SpO2 99%   BMI 29.46 kg/m  Wt Readings from Last 3 Encounters:  04/09/18 171 lb 9.6 oz (77.8 kg)  03/08/18 167 lb 6.4 oz (75.9 kg)  01/18/18 166 lb 11.2 oz (75.6 kg)    Physical Exam Vitals signs and nursing note reviewed.  Constitutional:      Appearance: She is well-developed.  HENT:     Head: Normocephalic and atraumatic.  Neck:     Musculoskeletal: Normal range of motion.  Cardiovascular:     Rate and Rhythm: Normal rate and regular rhythm.     Heart sounds: Normal heart sounds. No murmur. No friction rub. No gallop.   Pulmonary:     Effort: Pulmonary effort is normal. No tachypnea or respiratory distress.     Breath sounds: Normal breath sounds. No decreased breath sounds, wheezing, rhonchi or rales.  Chest:  Chest wall: No tenderness.  Abdominal:     General: Bowel sounds are normal.     Palpations: Abdomen is soft.  Musculoskeletal: Normal range of motion.  Skin:    General: Skin is warm and dry.  Neurological:     Mental Status: She is alert and oriented to person, place, and time.     Coordination: Coordination normal.  Psychiatric:        Behavior: Behavior normal. Behavior is cooperative.        Thought Content: Thought content normal.        Judgment: Judgment normal.          Patient has been counseled extensively about nutrition and exercise as well as the importance of adherence with medications and regular follow-up. The patient was given clear instructions to go to ER or return to medical center if symptoms don't improve, worsen or new problems develop. The patient verbalized understanding.   Follow-up: Return in about 6 months  (around 10/08/2018).   Gildardo Pounds, FNP-BC [facility] [place], [place]   04/09/2018, 5:40 PM

## 2018-04-24 MED FILL — ATORVASTATIN 20 MG TABLET: 20 | 30 days supply | Qty: 30 | Fill #7

## 2018-05-01 MED FILL — HYDROCHLOROTHIAZIDE 25 MG T: 25 | 30 days supply | Qty: 30 | Fill #4

## 2018-05-06 MED FILL — SYMBICORT 160-4.5 MCG INH: 160-4.5 | 30 days supply | Qty: 10 | Fill #1

## 2018-05-22 MED FILL — ATORVASTATIN 20 MG TABLET: 20 | 30 days supply | Qty: 30 | Fill #8

## 2018-05-28 MED FILL — HYDROCHLOROTHIAZIDE 25 MG T: 25 | 30 days supply | Qty: 30 | Fill #5

## 2018-06-05 ENCOUNTER — Encounter: Payer: Self-pay | Admitting: *Deleted

## 2018-06-05 ENCOUNTER — Telehealth: Payer: Self-pay | Admitting: Pulmonary Disease

## 2018-06-05 NOTE — Telephone Encounter (Signed)
Fine with me but will need consult slot and all meds/inhalers in hand for ov

## 2018-06-05 NOTE — Telephone Encounter (Signed)
Called and spoke with patient.  Patient aware of provider change.  Dr Melvyn Novas recommendations given. Patient scheduled 06/17/18, at 3pm, as consult, with MW.  Patient told to arrive 15 min. early for paperwork, and to bring all medications. Understanding stated. Patient requested a Patient reminder letter to be sent to her home address.  Letter typed and placed in out going mail. Nothing further at this time.

## 2018-06-05 NOTE — Telephone Encounter (Signed)
Ok with me 

## 2018-06-05 NOTE — Telephone Encounter (Signed)
Dr. Vaughan Browner, please advise if you are okay with pt switching to Dr. Melvyn Novas. Thanks!

## 2018-06-17 ENCOUNTER — Ambulatory Visit (INDEPENDENT_AMBULATORY_CARE_PROVIDER_SITE_OTHER): Payer: 59 | Admitting: Internal Medicine

## 2018-06-17 ENCOUNTER — Ambulatory Visit (INDEPENDENT_AMBULATORY_CARE_PROVIDER_SITE_OTHER): Payer: 59

## 2018-06-17 ENCOUNTER — Encounter: Payer: Self-pay | Admitting: Internal Medicine

## 2018-06-17 ENCOUNTER — Other Ambulatory Visit: Payer: Self-pay

## 2018-06-17 VITALS — BP 148/82 | HR 69 | Temp 98.1°F | Ht 64.5 in | Wt 175.0 lb

## 2018-06-17 DIAGNOSIS — F17210 Nicotine dependence, cigarettes, uncomplicated: Secondary | ICD-10-CM | POA: Diagnosis not present

## 2018-06-17 DIAGNOSIS — D869 Sarcoidosis, unspecified: Secondary | ICD-10-CM | POA: Diagnosis not present

## 2018-06-17 DIAGNOSIS — J449 Chronic obstructive pulmonary disease, unspecified: Secondary | ICD-10-CM

## 2018-06-17 MED ORDER — BUDESONIDE-FORMOTEROL FUMARATE 80-4.5 MCG/ACT IN AERO: 2.0000 | INHALATION_SPRAY | Freq: Two times a day (BID) | RESPIRATORY_TRACT | 11 refills | Status: DC

## 2018-06-17 MED ORDER — NICOTINE 7 MG/24HR TD PT24: 7.0000 mg | MEDICATED_PATCH | Freq: Every day | TRANSDERMAL | 5 refills | Status: DC

## 2018-06-17 MED FILL — NICOTINE 7 MG/24HR PATCH: 7 | 28 days supply | Qty: 28 | Fill #0

## 2018-06-17 NOTE — Patient Instructions (Addendum)
Symbicort 80 Take 2 puffs first thing in am and then another 2 puffs about 12 hours later.   Work on inhaler technique:  relax and gently blow all the way out then take a nice smooth deep breath back in, triggering the inhaler at same time you start breathing in.  Hold for up to 5 seconds if you can. Blow out thru nose. Brush teeth /tongue arm and hammer tooth rinse and gargle after use      Please remember to go to the x-ray department   for your tests - we will call you with the results when they are available.  Stay on nicotine 7mg /day patch and f/u PCP    Ideally, you will need yearly PFTs every September until we see that they are stabilized

## 2018-06-17 NOTE — Progress Notes (Signed)
Sarah Cruz, female    DOB: 01-19-1966,    MRN: [id number]   Brief patient profile:  63 yobf grew up in [place]  ? Asthma as Cruz but good ex tol in HS with new onset sob, wheezing around 2017 > [facility] fob 11/2015 dx sarcoid/ copd with asthma component by pfts > rx with symbicort 160 2 q12 s prednisone with improved ex tol req second opinion for longterm maint rx so self referred 06/17/2018 .     History of Present Illness  06/17/2018   1st office eval/[facility] re sarcoid vs copd/ 2nd opinion/ rx symbicort 160 2 each am  Chief Complaint  Patient presents with  . Consult    Former patient of Dr Vaughan Browner. Pt states her breathing is at her normal baseline today. She has been walking 1/2 to 1 mile daily. She has occ cough and mucus gets stuck in her throat. She rarely uses her albuterol inhaler.   Dyspnea:  Nl pace / can't jog  Cough: sensation of globus but no excess mucus Sleep: bed is flat / two pillows / no am flares  SABA use: none now    No obvious day to day or daytime variability or assoc excess/ purulent sputum or mucus plugs or hemoptysis or cp or chest tightness, subjective wheeze or overt sinus or hb symptoms.   Sleeping as above  without nocturnal  or early am exacerbation  of respiratory  c/o's or need for noct saba. Also denies any obvious fluctuation of symptoms with weather or environmental changes or other aggravating or alleviating factors except as outlined above   No unusual exposure hx or h/o childhood pna/ asthma or knowledge of premature birth.  Current Allergies, Complete Past Medical History, Past Surgical History, Family History, and Social History were reviewed in [employer] record.  ROS  The following are not active complaints unless bolded Hoarseness, sore throat, dysphagia, dental problems, itching, sneezing,  nasal congestion or discharge of excess mucus or purulent secretions, ear ache,   fever, chills, sweats, unintended wt loss or  wt gain, classically pleuritic or exertional cp,  orthopnea pnd or arm/hand swelling  or leg swelling, presyncope, palpitations, abdominal pain, anorexia, nausea, vomiting, diarrhea  or change in bowel habits or change in bladder habits, change in stools or change in urine, dysuria, hematuria,  rash, arthralgias/cts on R , visual complaints, headache, numbness, weakness or ataxia or problems with walking or coordination,  change in mood or  memory.           Past Medical History:  Diagnosis Date  . Childhood asthma   . COPD (chronic obstructive pulmonary disease) ([place])   . Hypertension   . Sarcoidosis     Outpatient Medications Prior to Visit  Medication Sig Dispense Refill  . acetaminophen (TYLENOL) 500 MG tablet Take 1,000 mg by mouth 2 (two) times daily as needed for mild pain or headache.    . albuterol (PROVENTIL HFA;VENTOLIN HFA) 108 (90 Base) MCG/ACT inhaler Inhale 2 puffs into the lungs every 6 (six) hours as needed for wheezing or shortness of breath. 1 Inhaler 2  . atorvastatin (LIPITOR) 20 MG tablet Take 1 tablet (20 mg total) by mouth daily. 90 tablet 3  . budesonide-formoterol (SYMBICORT) 160-4.5 MCG/ACT inhaler Inhale 2 puffs into the lungs 2 (two) times daily. 1 Inhaler 0  . hydrochlorothiazide (HYDRODIURIL) 25 MG tablet Take 1 tablet (25 mg total) by mouth daily. 90 tablet 1  . Nicotine 21-14-7 MG/24HR KIT  Use as directed. 84 each 0  . ibuprofen (ADVIL,MOTRIN) 800 MG tablet Take 1 tablet (800 mg total) by mouth every 6 (six) hours as needed for moderate pain (Take with food). (Patient not taking: Reported on 03/08/2018) 60 tablet 1  . nicotine polacrilex (NICOTINE MINI) 2 MG lozenge Take 1 lozenge (2 mg total) by mouth as needed for smoking cessation. 100 tablet 0   No facility-administered medications prior to visit.      Objective:     BP (!) 148/82 (BP Location: Left Arm, Cuff Size: Normal)   Pulse 69   Temp 98.1 F (36.7 C) (Oral)   Ht 5' 4.5" (1.638 m)   Wt 175  lb (79.4 kg)   SpO2 100%   BMI 29.57 kg/m   SpO2: 100 % RA   HEENT: nl dentition, turbinates bilaterally, and oropharynx. Nl external ear canals without cough reflex   NECK :  without JVD/Nodes/TM/ nl carotid upstrokes bilaterally   LUNGS: no acc muscle use,  Nl contour chest which is clear to A and P bilaterally without cough on insp or exp maneuvers   CV:  RRR  no s3 or murmur or increase in P2, and no edema   ABD:  soft and nontender with nl inspiratory excursion in the supine position. No bruits or organomegaly appreciated, bowel sounds nl  MS:  Nl gait/ ext warm without deformities, calf tenderness, cyanosis or clubbing No obvious joint restrictions   SKIN: warm and dry without lesions    NEURO:  alert, approp, nl sensorium with  no motor or cerebellar deficits apparent.    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 No acute infiltrate.      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        COPD GOLD II/ still smoking  Spirometry 11/02/16   FEV1 1.46 (62%)  Ratio 0.60 with classic curature - 06/17/2018  After extensive coaching inhaler device,  effectiveness =    90% from baseline 75% and h/o globus sensation from symb 160 2 each am so rec 80 2bid trial    It may well be that less will prove to be more here in terms of symbicort dosing if she'll be more consistent with inhaling her  meds and stop inhaling cigs (see separate a/p)     Cigarette smoker Counseled re importance of smoking cessation but did not meet time criteria for separate billing    Discussed:  I reviewed the Fletcher curve with the patient that basically indicates  if you quit smoking when your best day FEV1 is still  preserved (as is relatively still  the case here)  it is highly unlikely you will progress to severe disease and informed the patient there was  no medication on the market that has proven to alter the curve/ its downward trajectory  or the likelihood of progression of  their disease(unlike other chronic medical conditions such as atheroclerosis where we do think we can change the natural hx with risk reducing meds like her lipitor)    Therefore stopping smoking and maintaining abstinence are  the most important aspects of care, not choice of inhalers or for that matter, doctors.   Treatment other than smoking cessation  is entirely directed by severity of symptoms and focused also on reducing exacerbations, not attempting to change the natural history of the disease.    rec try reduce the nicotine patch to 7 mg / day and f/u PCP      Total time devoted to counseling  > 50 % of initial 60 min office visit:  review case with pt/See device teaching which extended face to face time for this visit/ discussion of options/alternatives/ personally creating written customized instructions  in presence of pt  then going over those specific  Instructions directly with the pt including how to use all of the meds but in particular covering each new medication in detail and the difference between the maintenance= "automatic" meds and the prns using an action plan format for the latter (If this problem/symptom => do that organization reading Left to right).  Please see AVS from this visit for a full list of these instructions which I personally wrote for this pt and  are unique to this visit.      Christinia Gully, MD 06/17/2018

## 2018-06-18 ENCOUNTER — Encounter: Payer: Self-pay | Admitting: Internal Medicine

## 2018-06-18 DIAGNOSIS — F17210 Nicotine dependence, cigarettes, uncomplicated: Secondary | ICD-10-CM | POA: Insufficient documentation

## 2018-06-18 MED FILL — ATORVASTATIN 20 MG TABLET: 20 | 30 days supply | Qty: 30 | Fill #9

## 2018-06-18 NOTE — Assessment & Plan Note (Addendum)
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

## 2018-06-18 NOTE — Progress Notes (Signed)
Left detailed msg on machine ok per DPR

## 2018-06-18 NOTE — Assessment & Plan Note (Signed)
Counseled re importance of smoking cessation but did not meet time criteria for separate billing     Discussed:  I reviewed the Fletcher curve with the patient that basically indicates  if you quit smoking when your best day FEV1 is still  preserved (as is relatively still  the case here)  it is highly unlikely you will progress to severe disease and informed the patient there was  no medication on the market that has proven to alter the curve/ its downward trajectory  or the likelihood of progression of their disease(unlike other chronic medical conditions such as atheroclerosis where we do think we can change the natural hx with risk reducing meds like her lipitor)    Therefore stopping smoking and maintaining abstinence are  the most important aspects of care, not choice of inhalers or for that matter, doctors.   Treatment other than smoking cessation  is entirely directed by severity of symptoms and focused also on reducing exacerbations, not attempting to change the natural history of the disease.    rec try reduce the nicotine patch to 7 mg / day and f/u PCP     Total time devoted to counseling  > 50 % of initial 60 min office visit:  review case with pt/See device teaching which extended face to face time for this visit/ discussion of options/alternatives/ personally creating written customized instructions  in presence of pt  then going over those specific  Instructions directly with the pt including how to use all of the meds but in particular covering each new medication in detail and the difference between the maintenance= "automatic" meds and the prns using an action plan format for the latter (If this problem/symptom => do that organization reading Left to right).  Please see AVS from this visit for a full list of these instructions which I personally wrote for this pt and  are unique to this visit.

## 2018-06-18 NOTE — Assessment & Plan Note (Addendum)
Spirometry 11/02/16   FEV1 1.46 (62%)  Ratio 0.60 with classic curature - 06/17/2018  After extensive coaching inhaler device,  effectiveness =    90% from baseline 75% and h/o globus sensation from symb 160 2 each am so rec 80 2bid trial    It may well be that less will prove to be more here in terms of symbicort dosing if she'll be more consistent with inhaling her meds and stop inhaling cigs (see separate a/p)

## 2018-06-29 MED FILL — HYDROCHLOROTHIAZIDE 25 MG T: 25 | 30 days supply | Qty: 30 | Fill #0

## 2018-07-15 MED FILL — SYMBICORT 160-4.5 MCG INH: 160-4.5 | 30 days supply | Qty: 10 | Fill #2

## 2018-07-19 MED FILL — HYDROCHLOROTHIAZIDE 25 MG T: 25 | 30 days supply | Qty: 30 | Fill #1

## 2018-07-19 MED FILL — ATORVASTATIN 20 MG TABLET: 20 | 30 days supply | Qty: 30 | Fill #10

## 2018-08-19 MED FILL — ATORVASTATIN 20 MG TABLET: 20 | 30 days supply | Qty: 30 | Fill #1

## 2018-08-28 MED FILL — HYDROCHLOROTHIAZIDE 25 MG T: 25 | 30 days supply | Qty: 30 | Fill #2

## 2018-08-29 MED FILL — SYMBICORT 160-4.5 MCG INH: 160-4.5 | 30 days supply | Qty: 10 | Fill #3

## 2018-09-16 ENCOUNTER — Ambulatory Visit (HOSPITAL_BASED_OUTPATIENT_CLINIC_OR_DEPARTMENT_OTHER): Payer: 59 | Admitting: Nurse Practitioner

## 2018-09-16 ENCOUNTER — Other Ambulatory Visit: Payer: Self-pay

## 2018-09-16 ENCOUNTER — Other Ambulatory Visit (HOSPITAL_COMMUNITY)
Admission: RE | Admit: 2018-09-16 | Discharge: 2018-09-16 | Disposition: A | Discharge status: Home / Self Care | Payer: 59 | Source: Ambulatory Visit | Attending: Nurse Practitioner | Admitting: Nurse Practitioner

## 2018-09-16 ENCOUNTER — Encounter: Payer: Self-pay | Admitting: Nurse Practitioner

## 2018-09-16 VITALS — BP 114/75 | HR 79 | Temp 98.7°F | Ht 64.0 in | Wt 181.2 lb

## 2018-09-16 DIAGNOSIS — N898 Other specified noninflammatory disorders of vagina: Secondary | ICD-10-CM | POA: Insufficient documentation

## 2018-09-16 DIAGNOSIS — Z1231 Encounter for screening mammogram for malignant neoplasm of breast: Secondary | ICD-10-CM

## 2018-09-16 DIAGNOSIS — F17200 Nicotine dependence, unspecified, uncomplicated: Secondary | ICD-10-CM

## 2018-09-16 DIAGNOSIS — I10 Essential (primary) hypertension: Secondary | ICD-10-CM

## 2018-09-16 DIAGNOSIS — R7309 Other abnormal glucose: Secondary | ICD-10-CM | POA: Diagnosis not present

## 2018-09-16 DIAGNOSIS — E669 Obesity, unspecified: Secondary | ICD-10-CM | POA: Diagnosis not present

## 2018-09-16 DIAGNOSIS — N760 Acute vaginitis: Secondary | ICD-10-CM

## 2018-09-16 DIAGNOSIS — F17210 Nicotine dependence, cigarettes, uncomplicated: Secondary | ICD-10-CM

## 2018-09-16 LAB — POCT URINALYSIS DIPSTICK
Bilirubin, UA: NEGATIVE
Blood, UA: NEGATIVE
Glucose, UA: NEGATIVE
Ketones, UA: NEGATIVE
Leukocytes, UA: NEGATIVE
Nitrite, UA: NEGATIVE
Protein, UA: NEGATIVE
Spec Grav, UA: 1.015 (ref 1.010–1.025)
Urobilinogen, UA: 0.2 E.U./dL
pH, UA: 6 (ref 5.0–8.0)

## 2018-09-16 MED ORDER — HYDROCHLOROTHIAZIDE 25 MG PO TABS: 25.0000 mg | ORAL_TABLET | Freq: Every day | ORAL | 1 refills | Status: DC

## 2018-09-16 MED ORDER — NICOTINE 7 MG/24HR TD PT24: 7.0000 mg | MEDICATED_PATCH | Freq: Every day | TRANSDERMAL | 3 refills | Status: DC

## 2018-09-16 MED ORDER — PHENTERMINE HCL 37.5 MG PO CAPS: 37.5000 mg | ORAL_CAPSULE | ORAL | 1 refills | Status: DC

## 2018-09-16 MED FILL — NICOTINE 7 MG/24HR PATCH: 7 | 28 days supply | Qty: 28 | Fill #0

## 2018-09-16 MED FILL — ATORVASTATIN 20 MG TABLET: 20 | 30 days supply | Qty: 30 | Fill #2

## 2018-09-16 NOTE — Progress Notes (Signed)
Assessment & Plan:  Sarah Cruz was seen today for follow-up.  Diagnoses and all orders for this visit:  Essential hypertension -     CBC -     Lipid panel -     CMP14+EGFR -     hydrochlorothiazide (HYDRODIURIL) 25 MG tablet; Take 1 tablet (25 mg total) by mouth daily. Continue all antihypertensives as prescribed.  Remember to bring in your blood pressure log with you for your follow up appointment.  DASH/Mediterranean Diets are healthier choices for HTN.    Acute vaginitis -     Cervicovaginal ancillary only -     Urinalysis Dipstick  Obesity (BMI 30-39.9) -     phentermine 37.5 MG capsule; Take 1 capsule (37.5 mg total) by mouth every morning. Discussed diet and exercise for person with BMI >30. Instructed: You must burn more calories than you eat. Losing 5 percent of your body weight should be considered a success. In the longer term, losing more than 15 percent of your body weight and staying at this weight is an extremely good result. However, keep in mind that even losing 5 percent of your body weight leads to important health benefits, so try not to get discouraged if you're not able to lose more than this. Will recheck weight in 3-6 months.  Breast cancer screening by mammogram -     MM 3D SCREEN BREAST BILATERAL; Future  Elevated glucose -     Hemoglobin A1c  Tobacco dependence -     nicotine (NICODERM CQ - DOSED IN MG/24 HR) 7 mg/24hr patch; Place 1 patch (7 mg total) onto the skin daily. She is trying to quit.   Patient has been counseled on age-appropriate routine health concerns for screening and prevention. These are reviewed and up-to-date. Referrals have been placed accordingly. Immunizations are up-to-date or declined.    Subjective:   Chief Complaint  Patient presents with   Follow-up    Pt. is here for HTN follow up.    HPI Sarah Cruz 53 y.o. female presents to office today for HTN.   has a past medical history of Childhood asthma, COPD (chronic  obstructive pulmonary disease) ([place]), Hypertension, and Sarcoidosis. Evaluated by Dr. Melvyn Novas 06-17-2018 for 2nd opinion: sarcoids and COPD.   PER DR. 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  I reviewed the Fletcher curve with the patient that basically indicates  if you quit smoking when your best day FEV1 is still  preserved (as is relatively still  the case here)  it is highly unlikely you will progress to severe disease and informed the patient there was  no medication on the market that has proven to alter the curve/ its downward trajectory  or the likelihood of progression of their disease(unlike other chronic medical conditions such as atheroclerosis where we do think we can change the natural hx with risk reducing meds like her lipitor). Therefore stopping smoking and maintaining abstinence are  the most important aspects of care, not choice of inhalers or for that matter, doctors.    Essential Hypertension Currently well controlled. Taking HCTZ 25 mg daily as prescribed. She is still smoking 0.5 pack of cigarettes per day down from 1ppd. She Is still using the nicotine patches  that were prescribed for her. Will refill today.   Denies chest pain, worsening shortness of breath, palpitations, lightheadedness, dizziness, headaches or BLE edema.  BP Readings from Last 3 Encounters:  09/16/18 114/75  06/17/18 (!) 148/82  04/09/18 130/84   Obesity BMI 30 with co morbidities of hyperlipidemia and HTN.  Weight is up and she is requesting a prescription for an appetite suppressant to help lose weight. I have instructed her that I will support a 30 day trial however she is to incorporate an exercise routine as well as healthier eating into her day to day activities as  the medication is meant to  complement a lifestyle change.   Vaginitis: Patient complains of an abnormal vaginal discharge for several days. Vaginal symptoms include odor.Vulvar symptoms include odor.STI Risk: Very low risk of STD exposureDischarge described as: normal and physiologic.Other associated symptoms: none.  She reportedly used a feminine wash about a week ago and then began to notice an abnormal vaginal odor. She denies UTI symptoms,  being sexually active or at risk for STDs.    Review of Systems  Constitutional: Negative for fever, malaise/fatigue and weight loss.  HENT: Negative.  Negative for nosebleeds.   Eyes: Negative.  Negative for blurred vision, double vision and photophobia.  Respiratory: Negative.  Negative for cough and shortness of breath.   Cardiovascular: Negative.  Negative for chest pain, palpitations and leg swelling.  Gastrointestinal: Negative.  Negative for heartburn, nausea and vomiting.  Genitourinary:       SEE HPI  Musculoskeletal: Negative.  Negative for myalgias.  Neurological: Negative.  Negative for dizziness, focal weakness, seizures and headaches.  Psychiatric/Behavioral: Negative.  Negative for suicidal ideas.    Past Medical History:  Diagnosis Date   Childhood asthma    COPD (chronic obstructive pulmonary disease) ([place])    Hypertension    Sarcoidosis     Past Surgical History:  Procedure Laterality Date   ABDOMINAL HYSTERECTOMY     ENDOBRONCHIAL ULTRASOUND Bilateral 12/07/2015   Procedure: ENDOBRONCHIAL ULTRASOUND;  Surgeon: Javier Glazier, MD;  Location: [facility];  Service: Cardiopulmonary;  Laterality: Bilateral;    Family History  Problem Relation Age of Onset   Heart failure Mother    Diabetes Mother    Hypertension Mother    Arthritis Mother    Brain cancer Sister    Sickle cell anemia Other    Brain cancer Other    Asthma Other    Rheumatologic disease Neg Hx     Social History Reviewed with no  changes to be made today.   Outpatient Medications Prior to Visit  Medication Sig Dispense Refill   acetaminophen (TYLENOL) 500 MG tablet Take 1,000 mg by mouth 2 (two) times daily as needed for mild pain or headache.     albuterol (PROVENTIL HFA;VENTOLIN HFA) 108 (90 Base) MCG/ACT inhaler Inhale 2 puffs into the lungs every 6 (six) hours as needed for wheezing or shortness of breath. 1 Inhaler 2   atorvastatin (LIPITOR) 20 MG tablet Take 1 tablet (20 mg total) by mouth daily. 90 tablet 3   budesonide-formoterol (SYMBICORT) 80-4.5 MCG/ACT inhaler Inhale 2 puffs into the lungs 2 (two) times daily. 1 Inhaler 11   nicotine (NICODERM CQ - DOSED IN MG/24 HR) 7 mg/24hr patch Place 1 patch (7 mg total) onto the skin daily. 28 patch 5   hydrochlorothiazide (HYDRODIURIL) 25 MG tablet Take 1 tablet (25 mg total) by mouth daily. 90 tablet 1   No facility-administered medications prior to visit.  No Known Allergies     Objective:    BP 114/75 (BP Location: Left Arm, Patient Position: Sitting, Cuff Size: Normal)    Pulse 79    Temp 98.7 F (37.1 C) (Oral)    Ht _0  (1.626 m)    Wt 181 lb 3.2 oz (82.2 kg)    SpO2 99%    BMI 31.10 kg/m  Wt Readings from Last 3 Encounters:  09/16/18 181 lb 3.2 oz (82.2 kg)  06/17/18 175 lb (79.4 kg)  04/09/18 171 lb 9.6 oz (77.8 kg)    Physical Exam       Patient has been counseled extensively about nutrition and exercise as well as the importance of adherence with medications and regular follow-up. The patient was given clear instructions to go to ER or return to medical center if symptoms don't improve, worsen or new problems develop. The patient verbalized understanding.   Follow-up: Return in about 4 weeks (around 10/14/2018) for weight check.   Gildardo Pounds, FNP-BC [facility] and [place], [place]   09/16/2018, 5:37 PM

## 2018-09-17 LAB — HEMOGLOBIN A1C
Est. average glucose Bld gHb Est-mCnc: 128 mg/dL
Hgb A1c MFr Bld: 6.1 % — ABNORMAL HIGH (ref 4.8–5.6)

## 2018-09-17 LAB — CBC
Hematocrit: 39.6 % (ref 34.0–46.6)
Hemoglobin: 12.4 g/dL (ref 11.1–15.9)
MCH: 23 pg — ABNORMAL LOW (ref 26.6–33.0)
MCHC: 31.3 g/dL — ABNORMAL LOW (ref 31.5–35.7)
MCV: 73 fL — ABNORMAL LOW (ref 79–97)
Platelets: 302 10*3/uL (ref 150–450)
RBC: 5.4 x10E6/uL — ABNORMAL HIGH (ref 3.77–5.28)
RDW: 14.5 % (ref 11.7–15.4)
WBC: 6.2 10*3/uL (ref 3.4–10.8)

## 2018-09-17 LAB — LIPID PANEL
Chol/HDL Ratio: 2.4 ratio (ref 0.0–4.4)
Cholesterol, Total: 154 mg/dL (ref 100–199)
HDL: 64 mg/dL (ref >39)
LDL Calculated: 73 mg/dL (ref 0–99)
Triglycerides: 84 mg/dL (ref 0–149)
VLDL Cholesterol Cal: 17 mg/dL (ref 5–40)

## 2018-09-17 LAB — CMP14+EGFR
ALT: 31 IU/L (ref 0–32)
AST: 21 IU/L (ref 0–40)
Albumin/Globulin Ratio: 1.7 (ref 1.2–2.2)
Albumin: 4.6 g/dL (ref 3.8–4.9)
Alkaline Phosphatase: 70 IU/L (ref 39–117)
BUN/Creatinine Ratio: 20 (ref 9–23)
BUN: 18 mg/dL (ref 6–24)
Bilirubin Total: <0.2 mg/dL (ref 0.0–1.2)
CO2: 25 mmol/L (ref 20–29)
Calcium: 9.7 mg/dL (ref 8.7–10.2)
Chloride: 98 mmol/L (ref 96–106)
Creatinine, Ser: 0.91 mg/dL (ref 0.57–1.00)
GFR calc Af Amer: 83 mL/min/{1.73_m2} (ref >59)
GFR calc non Af Amer: 72 mL/min/{1.73_m2} (ref >59)
Globulin, Total: 2.7 g/dL (ref 1.5–4.5)
Glucose: 95 mg/dL (ref 65–99)
Potassium: 4.2 mmol/L (ref 3.5–5.2)
Sodium: 140 mmol/L (ref 134–144)
Total Protein: 7.3 g/dL (ref 6.0–8.5)

## 2018-09-20 LAB — CERVICOVAGINAL ANCILLARY ONLY
Bacterial vaginitis: POSITIVE — AB
Candida vaginitis: NEGATIVE
Chlamydia: NEGATIVE
Neisseria Gonorrhea: NEGATIVE
Trichomonas: NEGATIVE

## 2018-09-21 ENCOUNTER — Other Ambulatory Visit: Payer: Self-pay | Admitting: Nurse Practitioner

## 2018-09-21 DIAGNOSIS — D649 Anemia, unspecified: Secondary | ICD-10-CM

## 2018-09-21 DIAGNOSIS — Z1211 Encounter for screening for malignant neoplasm of colon: Secondary | ICD-10-CM

## 2018-09-21 MED ORDER — METRONIDAZOLE 500 MG PO TABS: 500.0000 mg | ORAL_TABLET | Freq: Two times a day (BID) | ORAL | 0 refills | Status: AC

## 2018-09-23 ENCOUNTER — Telehealth: Payer: Self-pay

## 2018-09-23 MED FILL — metroNIDAZOLE 500 MG TABS: 500 | 7 days supply | Qty: 14 | Fill #0

## 2018-09-23 MED FILL — HYDROCHLOROTHIAZIDE 25 MG T: 25 | 30 days supply | Qty: 30 | Fill #3

## 2018-09-23 NOTE — Telephone Encounter (Signed)
Pt states she can see her MyChart lab results are back but request a call to explain what it means and if she is to start taking diabetes medication.

## 2018-09-23 NOTE — Telephone Encounter (Signed)
CMA called patient and went over her lab results.  Pt. Understood. Pt. Verified DOB.

## 2018-10-07 ENCOUNTER — Other Ambulatory Visit: Payer: Self-pay | Admitting: Internal Medicine

## 2018-10-12 ENCOUNTER — Other Ambulatory Visit (HOSPITAL_COMMUNITY)
Admission: RE | Admit: 2018-10-12 | Discharge: 2018-10-12 | Disposition: A | Discharge status: Home / Self Care | Payer: 59 | Source: Ambulatory Visit | Attending: Internal Medicine | Admitting: Internal Medicine

## 2018-10-12 DIAGNOSIS — Z20828 Contact with and (suspected) exposure to other viral communicable diseases: Secondary | ICD-10-CM | POA: Insufficient documentation

## 2018-10-12 DIAGNOSIS — Z01812 Encounter for preprocedural laboratory examination: Secondary | ICD-10-CM | POA: Diagnosis present

## 2018-10-12 LAB — SARS CORONAVIRUS 2 (TAT 6-24 HRS): SARS Coronavirus 2: NEGATIVE

## 2018-10-14 ENCOUNTER — Ambulatory Visit: Payer: 59 | Admitting: Nurse Practitioner

## 2018-10-14 NOTE — Progress Notes (Signed)
mychart note sent

## 2018-10-18 ENCOUNTER — Other Ambulatory Visit: Payer: Self-pay

## 2018-10-18 ENCOUNTER — Ambulatory Visit (INDEPENDENT_AMBULATORY_CARE_PROVIDER_SITE_OTHER): Payer: 59 | Admitting: Internal Medicine

## 2018-10-18 DIAGNOSIS — D869 Sarcoidosis, unspecified: Secondary | ICD-10-CM

## 2018-10-18 LAB — PULMONARY FUNCTION TEST
DL/VA % pred: 102 %
DL/VA: 4.33 ml/min/mmHg/L
DLCO cor % pred: 73 %
DLCO cor: 15.86 ml/min/mmHg
DLCO unc % pred: 71 %
DLCO unc: 15.35 ml/min/mmHg
FEF 25-75 Post: 0.99 L/sec
FEF 25-75 Pre: 0.75 L/sec
FEF2575-%Change-Post: 32 %
FEF2575-%Pred-Post: 41 %
FEF2575-%Pred-Pre: 31 %
FEV1-%Change-Post: 8 %
FEV1-%Pred-Post: 68 %
FEV1-%Pred-Pre: 62 %
FEV1-Post: 1.61 L
FEV1-Pre: 1.48 L
FEV1FVC-%Change-Post: 1 %
FEV1FVC-%Pred-Pre: 77 %
FEV6-%Change-Post: 6 %
FEV6-%Pred-Post: 88 %
FEV6-%Pred-Pre: 82 %
FEV6-Post: 2.52 L
FEV6-Pre: 2.36 L
FEV6FVC-%Change-Post: 0 %
FEV6FVC-%Pred-Post: 102 %
FEV6FVC-%Pred-Pre: 102 %
FVC-%Change-Post: 6 %
FVC-%Pred-Post: 85 %
FVC-%Pred-Pre: 80 %
FVC-Post: 2.52 L
FVC-Pre: 2.36 L
Post FEV1/FVC ratio: 64 %
Post FEV6/FVC ratio: 100 %
Pre FEV1/FVC ratio: 62 %
Pre FEV6/FVC Ratio: 100 %
RV % pred: 119 %
RV: 2.26 L
TLC % pred: 88 %
TLC: 4.61 L

## 2018-10-18 MED ORDER — BUDESONIDE-FORMOTEROL FUMARATE 80-4.5 MCG/ACT IN AERO: 2.0000 | INHALATION_SPRAY | Freq: Two times a day (BID) | RESPIRATORY_TRACT | 0 refills | Status: DC

## 2018-10-18 NOTE — Progress Notes (Signed)
Full PFT performed today. °

## 2018-10-22 ENCOUNTER — Encounter: Payer: Self-pay | Admitting: Nurse Practitioner

## 2018-10-22 ENCOUNTER — Telehealth: Payer: Self-pay | Admitting: Internal Medicine

## 2018-10-22 ENCOUNTER — Other Ambulatory Visit: Payer: Self-pay

## 2018-10-22 ENCOUNTER — Ambulatory Visit: Payer: 59 | Attending: Nurse Practitioner | Admitting: Nurse Practitioner

## 2018-10-22 VITALS — BP 122/84 | HR 87 | Ht 64.0 in | Wt 172.0 lb

## 2018-10-22 DIAGNOSIS — I10 Essential (primary) hypertension: Secondary | ICD-10-CM

## 2018-10-22 DIAGNOSIS — E669 Obesity, unspecified: Secondary | ICD-10-CM | POA: Diagnosis not present

## 2018-10-22 DIAGNOSIS — R7303 Prediabetes: Secondary | ICD-10-CM

## 2018-10-22 LAB — GLUCOSE, POCT (MANUAL RESULT ENTRY): POC Glucose: 89 mg/dl (ref 70–99)

## 2018-10-22 MED ORDER — PHENTERMINE HCL 37.5 MG PO CAPS: 37.5000 mg | ORAL_CAPSULE | ORAL | 1 refills | Status: DC

## 2018-10-22 MED FILL — HYDROCHLOROTHIAZIDE 25 MG T: 25 | 30 days supply | Qty: 30 | Fill #4

## 2018-10-22 MED FILL — ATORVASTATIN 20 MG TABLET: 20 | 30 days supply | Qty: 30 | Fill #3

## 2018-10-22 NOTE — Telephone Encounter (Signed)
Pt aware of PFT result. Documented in results Nothing further needed.

## 2018-10-22 NOTE — Patient Instructions (Addendum)
Programme researcher, broadcasting/film/video in [place], [place] in: [facility] Address: [address], [place], [place] [address] Phone: [phone]

## 2018-10-22 NOTE — Progress Notes (Signed)
Assessment & Plan:  Sarah Cruz was seen today for weight loss.  Diagnoses and all orders for this visit:  Obesity (BMI 30-39.9) -     phentermine 37.5 MG capsule; Take 1 capsule (37.5 mg total) by mouth every morning.  Prediabetes -     Glucose (CBG)  Essential hypertension    Patient has been counseled on age-appropriate routine health concerns for screening and prevention. These are reviewed and up-to-date. Referrals have been placed accordingly. Immunizations are up-to-date or declined.    Subjective:   Chief Complaint  Patient presents with  . Weight Loss    Pt. is here for her weight check.    HPI Sarah Cruz 53 y.o. female presents to office today for weight check.  Obesity She was started on phentermine August 3rd and is here today for weight loss evaluation. She is doing great and has lost 9lb since last month. Denies any adverse or intolerable symptoms. Blood pressure is well controlled. Will continue phentermine at this time. She is also making healthier food choices. BMI down from >30 to 29 currently.   Essential Hypertension Well controlled. Taking HCTZ 25 mg daily as prescribed. Denies chest pain, shortness of breath, palpitations, lightheadedness, dizziness, headaches or BLE edema.  BP Readings from Last 3 Encounters:  10/22/18 122/84  09/16/18 114/75  06/17/18 (!) 148/82    Review of Systems  Constitutional: Negative for fever, malaise/fatigue and weight loss.  HENT: Negative.  Negative for nosebleeds.   Eyes: Negative.  Negative for blurred vision, double vision and photophobia.  Respiratory: Negative.  Negative for cough and shortness of breath.   Cardiovascular: Negative.  Negative for chest pain, palpitations and leg swelling.  Gastrointestinal: Negative.  Negative for heartburn, nausea and vomiting.  Musculoskeletal: Negative.  Negative for myalgias.  Neurological: Negative.  Negative for dizziness, focal weakness, seizures and headaches.   Psychiatric/Behavioral: Negative.  Negative for suicidal ideas.    Past Medical History:  Diagnosis Date  . Childhood asthma   . COPD (chronic obstructive pulmonary disease) ([place])   . Hypertension   . Sarcoidosis     Past Surgical History:  Procedure Laterality Date  . ABDOMINAL HYSTERECTOMY    . ENDOBRONCHIAL ULTRASOUND Bilateral 12/07/2015   Procedure: ENDOBRONCHIAL ULTRASOUND;  Surgeon: Javier Glazier, MD;  Location: [facility];  Service: Cardiopulmonary;  Laterality: Bilateral;    Family History  Problem Relation Age of Onset  . Heart failure Mother   . Diabetes Mother   . Hypertension Mother   . Arthritis Mother   . Brain cancer Sister   . Sickle cell anemia Other   . Brain cancer Other   . Asthma Other   . Rheumatologic disease Neg Hx     Social History Reviewed with no changes to be made today.   Outpatient Medications Prior to Visit  Medication Sig Dispense Refill  . acetaminophen (TYLENOL) 500 MG tablet Take 1,000 mg by mouth 2 (two) times daily as needed for mild pain or headache.    . albuterol (PROVENTIL HFA;VENTOLIN HFA) 108 (90 Base) MCG/ACT inhaler Inhale 2 puffs into the lungs every 6 (six) hours as needed for wheezing or shortness of breath. 1 Inhaler 2  . atorvastatin (LIPITOR) 20 MG tablet Take 1 tablet (20 mg total) by mouth daily. 90 tablet 3  . budesonide-formoterol (SYMBICORT) 80-4.5 MCG/ACT inhaler Inhale 2 puffs into the lungs 2 (two) times daily. 1 Inhaler 0  . hydrochlorothiazide (HYDRODIURIL) 25 MG tablet Take 1 tablet (25 mg total) by  mouth daily. 90 tablet 1  . nicotine (NICODERM CQ - DOSED IN MG/24 HR) 7 mg/24hr patch Place 1 patch (7 mg total) onto the skin daily. 28 patch 3  . budesonide-formoterol (SYMBICORT) 80-4.5 MCG/ACT inhaler Inhale 2 puffs into the lungs 2 (two) times daily. (Patient not taking: Reported on 10/22/2018) 1 Inhaler 11  . phentermine 37.5 MG capsule Take 1 capsule (37.5 mg total) by mouth every morning. 30 capsule 1    No facility-administered medications prior to visit.     No Known Allergies     Objective:    BP 122/84 (BP Location: Left Arm, Patient Position: Sitting, Cuff Size: Normal)   Pulse 87   Ht 5\' 4"  (1.626 m)   Wt 172 lb (78 kg)   SpO2 99%   BMI 29.52 kg/m  Wt Readings from Last 3 Encounters:  10/22/18 172 lb (78 kg)  09/16/18 181 lb 3.2 oz (82.2 kg)  06/17/18 175 lb (79.4 kg)    Physical Exam Vitals signs and nursing note reviewed.  Constitutional:      Appearance: She is well-developed.  HENT:     Head: Normocephalic and atraumatic.  Neck:     Musculoskeletal: Normal range of motion.  Cardiovascular:     Rate and Rhythm: Normal rate and regular rhythm.     Heart sounds: Normal heart sounds. No murmur. No friction rub. No gallop.   Pulmonary:     Effort: Pulmonary effort is normal. No tachypnea or respiratory distress.     Breath sounds: Normal breath sounds. No decreased breath sounds, wheezing, rhonchi or rales.  Chest:     Chest wall: No tenderness.  Abdominal:     General: Bowel sounds are normal.     Palpations: Abdomen is soft.  Musculoskeletal: Normal range of motion.  Skin:    General: Skin is warm and dry.  Neurological:     Mental Status: She is alert and oriented to person, place, and time.     Coordination: Coordination normal.  Psychiatric:        Behavior: Behavior normal. Behavior is cooperative.        Thought Content: Thought content normal.        Judgment: Judgment normal.          Patient has been counseled extensively about nutrition and exercise as well as the importance of adherence with medications and regular follow-up. The patient was given clear instructions to go to ER or return to medical center if symptoms don't improve, worsen or new problems develop. The patient verbalized understanding.   Follow-up: Return in about 2 months (around 12/22/2018).   Gildardo Pounds, FNP-BC [facility] and [place]  [place], [place]   10/22/2018, 2:07 PM

## 2018-11-01 ENCOUNTER — Ambulatory Visit (INDEPENDENT_AMBULATORY_CARE_PROVIDER_SITE_OTHER): Payer: 59 | Admitting: Nurse Practitioner

## 2018-11-01 ENCOUNTER — Other Ambulatory Visit: Payer: Self-pay

## 2018-11-01 ENCOUNTER — Encounter: Payer: Self-pay | Admitting: Nurse Practitioner

## 2018-11-01 ENCOUNTER — Other Ambulatory Visit: Payer: 59

## 2018-11-01 ENCOUNTER — Ambulatory Visit
Admission: RE | Admit: 2018-11-01 | Discharge: 2018-11-01 | Disposition: A | Discharge status: Home / Self Care | Payer: 59 | Source: Ambulatory Visit | Attending: Nurse Practitioner | Admitting: Nurse Practitioner

## 2018-11-01 VITALS — BP 132/76 | Temp 97.8°F | Ht 64.5 in | Wt 170.0 lb

## 2018-11-01 DIAGNOSIS — Z1211 Encounter for screening for malignant neoplasm of colon: Secondary | ICD-10-CM

## 2018-11-01 DIAGNOSIS — R718 Other abnormality of red blood cells: Secondary | ICD-10-CM | POA: Diagnosis not present

## 2018-11-01 DIAGNOSIS — Z1231 Encounter for screening mammogram for malignant neoplasm of breast: Secondary | ICD-10-CM

## 2018-11-01 MED ORDER — NA SULFATE-K SULFATE-MG SULF 17.5-3.13-1.6 GM/177ML PO SOLN: ORAL | 0 refills | Status: DC

## 2018-11-01 NOTE — Patient Instructions (Signed)
If you are age 54 or older, your body mass index should be between 23-30. Your Body mass index is 28.73 kg/m. If this is out of the aforementioned range listed, please consider follow up with your Primary Care Provider.  If you are age 79 or younger, your body mass index should be between 19-25. Your Body mass index is 28.73 kg/m. If this is out of the aformentioned range listed, please consider follow up with your Primary Care Provider.   You have been scheduled for a colonoscopy. Please follow written instructions given to you at your visit today.  Please pick up your prep supplies at the pharmacy within the next 1-3 days. If you use inhalers (even only as needed), please bring them with you on the day of your procedure. Your physician has requested that you go to www.startemmi.com and enter the access code given to you at your visit today. This web site gives a general overview about your procedure. However, you should still follow specific instructions given to you by our office regarding your preparation for the procedure.  We have sent the following medications to your pharmacy for you to pick up at your convenience: Suprep  HOLD PHENTERMINE FOR 10-14 [place].  Your provider has requested that you go to the basement level for lab work before leaving today. Press "B" on the elevator. The lab is located at the first door on the left as you exit the elevator.  Thank you for choosing me and [facility].   Tye Savoy, NP

## 2018-11-01 NOTE — Progress Notes (Signed)
ASSESSMENT / PLAN:   69. 53 yo female for colon cancer screening. This will be her first colonoscopy.  --The risks and benefits of colonoscopy with possible polypectomy / biopsies were discussed and the patient agrees to proceed.  --Hold Phentermine for 10 days prior to colonoscopy   2. Hx of anemia as child. Patient thought reason for referral was anemia but her hgb has been in low 12 range since 2017. Her MCV is slightly low however.  --Ferritin , TIBC   3. Sarcoidosis. Followed by Dr. Melvyn Novas. Never required systemic steroids.    HPI:    Referring Provider:    Geryl Rankins, NP   Reason for referral:   ? Anemia and colon cancer screening  Chief Complaint:  none   Patient is a 53 yo female with hx of COPD, sarcoidosis, HTN, obesity. Patient thought she was referred for anemia but hgb has been in 12 range since 2017. MCV is low however. No overt GI bleeding. Not big NSAID consumer. No abdominal pain. No bowel changes or other GI symptoms. No [place] of colon cancer. Dad died with pancreatic cancer.   Past Surgical History:  Procedure Laterality Date  . ABDOMINAL HYSTERECTOMY    . ENDOBRONCHIAL ULTRASOUND Bilateral 12/07/2015   Procedure: ENDOBRONCHIAL ULTRASOUND;  Surgeon: Javier Glazier, MD;  Location: [facility];  Service: Cardiopulmonary;  Laterality: Bilateral;   Family History  Problem Relation Age of Onset  . Heart failure Mother   . Diabetes Mother   . Hypertension Mother   . Arthritis Mother   . Brain cancer Sister   . Sickle cell anemia Other   . Brain cancer Other   . Asthma Other   . Rheumatologic disease Neg Hx    Social History   Tobacco Use  . Smoking status: Current Every Day Smoker    Packs/day: 0.50    Years: 38.00    Pack years: 19.00    Types: Cigarettes    Start date: 04/08/1977  . Smokeless tobacco: Never Used  . Tobacco comment: down to 0.5ppd  Substance Use Topics  . Alcohol use: No    Comment: Remote EtOH  . Drug use: No    Current Outpatient Medications  Medication Sig Dispense Refill  . acetaminophen (TYLENOL) 500 MG tablet Take 1,000 mg by mouth 2 (two) times daily as needed for mild pain or headache.    . albuterol (PROVENTIL HFA;VENTOLIN HFA) 108 (90 Base) MCG/ACT inhaler Inhale 2 puffs into the lungs every 6 (six) hours as needed for wheezing or shortness of breath. 1 Inhaler 2  . atorvastatin (LIPITOR) 20 MG tablet Take 1 tablet (20 mg total) by mouth daily. 90 tablet 3  . budesonide-formoterol (SYMBICORT) 80-4.5 MCG/ACT inhaler Inhale 2 puffs into the lungs 2 (two) times daily. 1 Inhaler 11  . budesonide-formoterol (SYMBICORT) 80-4.5 MCG/ACT inhaler Inhale 2 puffs into the lungs 2 (two) times daily. 1 Inhaler 0  . nicotine (NICODERM CQ - DOSED IN MG/24 HR) 7 mg/24hr patch Place 1 patch (7 mg total) onto the skin daily. 28 patch 3  . phentermine 37.5 MG capsule Take 1 capsule (37.5 mg total) by mouth every morning. 30 capsule 1   No current facility-administered medications for this visit.    No Known Allergies  Review of Systems: Positive for swelling of feet and legs . All other systems reviewed and negative except where noted in HPI.  Physical Exam:    Wt Readings from Last 3 Encounters:  11/01/18 170 lb (77.1 kg)  10/22/18 172 lb (78 kg)  09/16/18 181 lb 3.2 oz (82.2 kg)    BP 132/76   Temp 97.8 F (36.6 C) (Temporal)   Ht 5' 4.5" (1.638 m)   Wt 170 lb (77.1 kg)   BMI 28.73 kg/m  Constitutional:  Pleasant female in no acute distress. Psychiatric: Normal mood and affect. Behavior is normal. EENT: Pupils normal.  Conjunctivae are normal. No scleral icterus. Neck supple.  Cardiovascular: Normal rate, regular rhythm. No edema Pulmonary/chest: Effort normal and breath sounds normal. No wheezing, rales or rhonchi. Abdominal: Soft, nondistended, nontender. Bowel sounds active throughout. There are no masses palpable. No hepatomegaly. Neurological: Alert and oriented to person place and  time. Skin: Skin is warm and dry. No rashes noted.  Tye Savoy, NP  11/01/2018, 1:52 PM  Cc: Gildardo Pounds, NP

## 2018-11-02 LAB — IRON,TIBC AND FERRITIN PANEL
%SAT: 25 % (calc) (ref 16–45)
Ferritin: 94 ng/mL (ref 16–232)
Iron: 64 ug/dL (ref 45–160)
TIBC: 255 mcg/dL (calc) (ref 250–450)

## 2018-11-04 ENCOUNTER — Telehealth: Payer: Self-pay | Admitting: Nurse Practitioner

## 2018-11-04 NOTE — Telephone Encounter (Signed)
Patient came in wanting to get her results please follow up.

## 2018-11-04 NOTE — Progress Notes (Signed)
____________________________________________________________  Attending physician addendum:  Thank you for sending this case to me. I have reviewed the entire note, and the outlined plan seems appropriate.  Screening colonoscopy.  Perhaps she has thalassemia to account for microcytosis, if it has truly been lifelong.  Wilfrid Lund, MD  ____________________________________________________________

## 2018-11-05 ENCOUNTER — Telehealth: Payer: Self-pay | Admitting: Nurse Practitioner

## 2018-11-05 NOTE — Telephone Encounter (Signed)
Spoke to patient and she was requesting Gastroenterologist lab results.  Pt. Was informed to call her Gastroenterologist.

## 2018-11-05 NOTE — Telephone Encounter (Signed)
Spoke with patient.  She is going to pick up sample of Surep.

## 2018-11-06 ENCOUNTER — Telehealth: Payer: Self-pay | Admitting: Gastroenterology

## 2018-11-06 NOTE — Telephone Encounter (Signed)
Spoke to the patient and explained that her test results were normal. Answered questions. No other complaints or concerns voiced by the end of the call.

## 2018-11-20 MED FILL — ATORVASTATIN CALCIUM 20 MG: 20 | 30 days supply | Qty: 30 | Fill #4

## 2018-11-27 MED FILL — HYDROCHLOROTHIAZIDE 25 MG T: 25 | 30 days supply | Qty: 30 | Fill #5

## 2018-12-05 MED FILL — SYMBICORT 80-4.5 MCG INH: 80-4.5 | 30 days supply | Qty: 10 | Fill #0

## 2018-12-10 ENCOUNTER — Encounter: Payer: Self-pay | Admitting: Gastroenterology

## 2018-12-11 ENCOUNTER — Telehealth: Payer: Self-pay

## 2018-12-11 NOTE — Telephone Encounter (Signed)
Covid-19 screening questions   Do you now or have you had a fever in the last 14 days?  Do you have any respiratory symptoms of shortness of breath or cough now or in the last 14 days?  Do you have any family members or close contacts with diagnosed or suspected Covid-19 in the past 14 days?  Have you been tested for Covid-19 and found to be positive?       

## 2018-12-12 ENCOUNTER — Ambulatory Visit (AMBULATORY_SURGERY_CENTER): Payer: 59 | Admitting: Gastroenterology

## 2018-12-12 ENCOUNTER — Other Ambulatory Visit: Payer: Self-pay

## 2018-12-12 ENCOUNTER — Other Ambulatory Visit: Payer: Self-pay | Admitting: Gastroenterology

## 2018-12-12 ENCOUNTER — Encounter: Payer: Self-pay | Admitting: Gastroenterology

## 2018-12-12 VITALS — BP 123/74 | HR 52 | Temp 97.5°F | Resp 14 | Ht 64.5 in | Wt 170.0 lb

## 2018-12-12 DIAGNOSIS — Z1211 Encounter for screening for malignant neoplasm of colon: Secondary | ICD-10-CM

## 2018-12-12 DIAGNOSIS — D124 Benign neoplasm of descending colon: Secondary | ICD-10-CM

## 2018-12-12 DIAGNOSIS — K620 Anal polyp: Secondary | ICD-10-CM

## 2018-12-12 DIAGNOSIS — A630 Anogenital (venereal) warts: Secondary | ICD-10-CM

## 2018-12-12 DIAGNOSIS — D122 Benign neoplasm of ascending colon: Secondary | ICD-10-CM

## 2018-12-12 DIAGNOSIS — D120 Benign neoplasm of cecum: Secondary | ICD-10-CM | POA: Diagnosis not present

## 2018-12-12 MED ORDER — SODIUM CHLORIDE 0.9 % IV SOLN: 500.0000 mL | Freq: Once | INTRAVENOUS | Status: DC

## 2018-12-12 NOTE — Progress Notes (Signed)
To PACU, VSS. Report to [email or web address] 

## 2018-12-12 NOTE — Progress Notes (Signed)
VS- Courtney Washington Temperature- Lisa Clapps 

## 2018-12-12 NOTE — Progress Notes (Signed)
Called to room to assist during endoscopic procedure.  Patient ID and intended procedure confirmed with present staff. Received instructions for my participation in the procedure from the performing physician.  

## 2018-12-12 NOTE — Op Note (Signed)
[facility] Patient Name: Sarah Cruz Procedure Date: 12/12/2018 3:05 PM MRN: [id number] Endoscopist: [place] Loletha Carrow , MD Age: 52 Referring MD:  Date of Birth: [birth year] Gender: Female Account #: [id number] Procedure:                Colonoscopy Indications:              Screening for colorectal malignant neoplasm, This                            is the patient's first colonoscopy Medicines:                Monitored Anesthesia Care Procedure:                Pre-Anesthesia Assessment:                           - Prior to the procedure, a History and Physical                            was performed, and patient medications and                            allergies were reviewed. The patient's tolerance of                            previous anesthesia was also reviewed. The risks                            and benefits of the procedure and the sedation                            options and risks were discussed with the patient.                            All questions were answered, and informed consent                            was obtained. Prior Anticoagulants: The patient has                            taken no previous anticoagulant or antiplatelet                            agents. ASA Grade Assessment: II - A patient with                            mild systemic disease. After reviewing the risks                            and benefits, the patient was deemed in                            satisfactory condition to undergo the procedure.  After obtaining informed consent, the colonoscope                            was passed under direct vision. Throughout the                            procedure, the patient's blood pressure, pulse, and                            oxygen saturations were monitored continuously. The                            Colonoscope was introduced through the anus and                            advanced to the the  cecum, identified by                            appendiceal orifice and ileocecal valve. The                            colonoscopy was performed without difficulty. The                            patient tolerated the procedure well. The quality                            of the bowel preparation was good. The ileocecal                            valve, appendiceal orifice, and rectum were                            photographed. Scope In: 3:21:18 PM Scope Out: 3:41:49 PM Scope Withdrawal Time: 0 hours 17 minutes 50 seconds  Total Procedure Duration: 0 hours 20 minutes 31 seconds  Findings:                 The perianal and digital rectal examinations were                            normal.                           Three sessile polyps were found in the ascending                            colon and cecum. The polyps were diminutive in                            size. These polyps were removed with a cold snare.                            Resection and retrieval were complete.  A diminutive polyp was found in the descending                            colon. The polyp was sessile. The polyp was removed                            with a cold snare. Resection and retrieval were                            complete.                           A diminutive polyp was found in the rectum, on the                            dentate line (12 o'clock position). The polyp was                            semi-sessile. The polyp was removed with a                            piecemeal technique using a cold biopsy forceps.                            Resection and retrieval were complete.                           The exam was otherwise without abnormality on                            direct and retroflexion views. Complications:            No immediate complications. Estimated Blood Loss:     Estimated blood loss was minimal. Impression:               - Three diminutive polyps  in the ascending colon                            and in the cecum, removed with a cold snare.                            Resected and retrieved.                           - One diminutive polyp in the descending colon,                            removed with a cold snare. Resected and retrieved.                           - One diminutive polyp in the rectum, removed                            piecemeal using a cold biopsy forceps. Resected and  retrieved.                           - The examination was otherwise normal on direct                            and retroflexion views. Recommendation:           - Patient has a contact number available for                            emergencies. The signs and symptoms of potential                            delayed complications were discussed with the                            patient. Return to normal activities tomorrow.                            Written discharge instructions were provided to the                            patient.                           - Resume previous diet.                           - Continue present medications.                           - Await pathology results.                           - Repeat colonoscopy is recommended for                            surveillance. The colonoscopy date will be                            determined after pathology results from today's                            exam become available for review. Henry L. Loletha Carrow, MD 12/12/2018 3:49:00 PM This report has been signed electronically.

## 2018-12-12 NOTE — Patient Instructions (Signed)
   HANDOUT ON POLYPS GIVEN TO YOU TODAY  AWAIT PATHOLOGY RESULTS ON POLYPS REMOVED( IN A LETTER FROM DR DANIS)   YOU HAD AN ENDOSCOPIC PROCEDURE TODAY AT [facility]:   Refer to the procedure report that was given to you for any specific questions about what was found during the examination.  If the procedure report does not answer your questions, please call your gastroenterologist to clarify.  If you requested that your care partner not be given the details of your procedure findings, then the procedure report has been included in a sealed envelope for you to review at your convenience later.  YOU SHOULD EXPECT: Some feelings of bloating in the abdomen. Passage of more gas than usual.  Walking can help get rid of the air that was put into your GI tract during the procedure and reduce the bloating. If you had a lower endoscopy (such as a colonoscopy or flexible sigmoidoscopy) you may notice spotting of blood in your stool or on the toilet paper. If you underwent a bowel prep for your procedure, you may not have a normal bowel movement for a few days.  Please Note:  You might notice some irritation and congestion in your nose or some drainage.  This is from the oxygen used during your procedure.  There is no need for concern and it should clear up in a day or so.  SYMPTOMS TO REPORT IMMEDIATELY:   Following lower endoscopy (colonoscopy or flexible sigmoidoscopy):  Excessive amounts of blood in the stool  Significant tenderness or worsening of abdominal pains  Swelling of the abdomen that is new, acute  Fever of 100F or higher   For urgent or emergent issues, a gastroenterologist can be reached at any hour by calling [phone].   DIET:  We do recommend a small meal at first, but then you may proceed to your regular diet.  Drink plenty of fluids but you should avoid alcoholic beverages for 24 hours.  ACTIVITY:  You should plan to take it easy for the rest of today and  you should NOT DRIVE or use heavy machinery until tomorrow (because of the sedation medicines used during the test).    FOLLOW UP: Our staff will call the number listed on your records 48-72 hours following your procedure to check on you and address any questions or concerns that you may have regarding the information given to you following your procedure. If we do not reach you, we will leave a message.  We will attempt to reach you two times.  During this call, we will ask if you have developed any symptoms of COVID 19. If you develop any symptoms (ie: fever, flu-like symptoms, shortness of breath, cough etc.) before then, please call [phone].  If you test positive for Covid 19 in the 2 weeks post procedure, please call and report this information to [place].    If any biopsies were taken you will be contacted by phone or by letter within the next 1-3 weeks.  Please call us at [phone] if you have not heard about the biopsies in 3 weeks.    SIGNATURES/CONFIDENTIALITY: You and/or your care partner have signed paperwork which will be entered into your electronic medical record.  These signatures attest to the fact that that the information above on your After Visit Summary has been reviewed and is understood.  Full responsibility of the confidentiality of this discharge information lies with you and/or your care-partner.

## 2018-12-16 ENCOUNTER — Telehealth: Payer: Self-pay | Admitting: *Deleted

## 2018-12-16 NOTE — Telephone Encounter (Signed)
Left message on f/u call 

## 2018-12-16 NOTE — Telephone Encounter (Signed)
  Follow up Call-  Call back number 12/12/2018  Post procedure Call Back phone  # [phone]  Permission to leave phone message Yes  Some recent data might be hidden     LMOM to call back with any questions or concerns.  Also, call back if patient has developed fever, respiratory issues or been dx with COVID or had any family members or close contacts diagnosed since her procedure.

## 2018-12-19 ENCOUNTER — Encounter: Payer: Self-pay | Admitting: Gastroenterology

## 2018-12-19 ENCOUNTER — Telehealth: Payer: Self-pay | Admitting: *Deleted

## 2018-12-19 NOTE — Telephone Encounter (Signed)
I spoke with her by phone about all results and plan.  Pathology letter composed and routed to [facility] nurse, who will mail it the patient.  Please send a referral to colorectal surgery (Drs. White or Thomas at [facility]) for AIN. They will be able to get the colonoscopy and pathology report from Epic at their office.

## 2018-12-19 NOTE — Telephone Encounter (Signed)
Dr. Loletha Carrow, this patient sent a MyChart message concerning her pathology results that she has received. Please advise.

## 2018-12-19 NOTE — Telephone Encounter (Signed)
Referral faxed to [facility].   Will continue to f/u to ensure patient is scheduled.

## 2018-12-20 ENCOUNTER — Other Ambulatory Visit: Payer: Self-pay | Admitting: Nurse Practitioner

## 2018-12-20 DIAGNOSIS — E782 Mixed hyperlipidemia: Secondary | ICD-10-CM

## 2018-12-20 MED FILL — HYDROCHLOROTHIAZIDE 25 MG T: 25 | 30 days supply | Qty: 30 | Fill #0

## 2018-12-20 MED FILL — ATORVASTATIN CALCIUM 20 MG: 20 | 30 days supply | Qty: 30 | Fill #0

## 2018-12-23 ENCOUNTER — Other Ambulatory Visit: Payer: Self-pay

## 2018-12-23 ENCOUNTER — Ambulatory Visit: Payer: 59 | Attending: Nurse Practitioner | Admitting: Nurse Practitioner

## 2018-12-23 ENCOUNTER — Encounter: Payer: Self-pay | Admitting: Nurse Practitioner

## 2018-12-23 VITALS — BP 113/78 | HR 88 | Temp 98.9°F | Ht 64.5 in | Wt 162.8 lb

## 2018-12-23 DIAGNOSIS — R634 Abnormal weight loss: Secondary | ICD-10-CM | POA: Diagnosis not present

## 2018-12-23 DIAGNOSIS — I10 Essential (primary) hypertension: Secondary | ICD-10-CM | POA: Diagnosis not present

## 2018-12-23 DIAGNOSIS — T50905A Adverse effect of unspecified drugs, medicaments and biological substances, initial encounter: Secondary | ICD-10-CM | POA: Diagnosis not present

## 2018-12-23 DIAGNOSIS — E782 Mixed hyperlipidemia: Secondary | ICD-10-CM

## 2018-12-23 MED ORDER — HYDROCHLOROTHIAZIDE 25 MG PO TABS: 25.0000 mg | ORAL_TABLET | Freq: Every day | ORAL | 1 refills | Status: DC

## 2018-12-23 MED ORDER — PHENTERMINE HCL 37.5 MG PO CAPS: 37.5000 mg | ORAL_CAPSULE | ORAL | 1 refills | Status: DC

## 2018-12-23 MED ORDER — ATORVASTATIN CALCIUM 20 MG PO TABS: 20.0000 mg | ORAL_TABLET | Freq: Every day | ORAL | 1 refills | Status: DC

## 2018-12-23 NOTE — Progress Notes (Signed)
Assessment & Plan:  Diagnoses and all orders for this visit:  Weight loss due to medication -     phentermine 37.5 MG capsule; Take 1 capsule (37.5 mg total) by mouth every morning. Exercise at least 30-45 minutes per day. Drink at least 64 oz of water    Mixed hyperlipidemia -     atorvastatin (LIPITOR) 20 MG tablet; Take 1 tablet (20 mg total) by mouth daily. Please fill as a 90 day supply Lab Results  Component Value Date   LDLCALC 73 09/16/2018  LDL at goal <80 INSTRUCTIONS: Work on a low fat, heart healthy diet and participate in regular aerobic exercise program by working out at least 150 minutes per week; 5 days a week-30 minutes per day. Avoid red meat/beef/steak,  fried foods. junk foods, sodas, sugary drinks, unhealthy snacking, alcohol and smoking.  Drink at least 80 oz of water per day and monitor your carbohydrate intake daily.    Essential hypertension -     hydrochlorothiazide (HYDRODIURIL) 25 MG tablet; Take 1 tablet (25 mg total) by mouth daily. Please fill as a 90 day supply Continue all antihypertensives as prescribed.  Remember to bring in your blood pressure log with you for your follow up appointment.  DASH/Mediterranean Diets are healthier choices for HTN.     Patient has been counseled on age-appropriate routine health concerns for screening and prevention. These are reviewed and up-to-date. Referrals have been placed accordingly. Immunizations are up-to-date or declined.    Subjective:  No chief complaint on file.  HPI Sarah Cruz 53 y.o. female presents to office today for follow up to HTN and weight loss.   Essential Hypertension Blood pressure is well controlled. She is taking HCTZ 25 mg daily as prescribed. Has not been monitoring her blood pressure at home. Denies chest pain, shortness of breath, palpitations, lightheadedness, dizziness, headaches or BLE edema. She does continue to smoke cigarettes. Not interested in smoking cessation  medications today.  BP Readings from Last 3 Encounters:  12/23/18 113/78  12/12/18 123/74  11/01/18 132/76   Weight Loss management Currently doing well on phentermine 37.5 mg daily.  She is almost at her goal of 160 pounds.  BMI is down to 27.51 from 31.10.  She denies any palpitations or chest pain. Will continue at this time.  Wt Readings from Last 3 Encounters:  12/23/18 162 lb 12.8 oz (73.8 kg)  12/12/18 170 lb (77.1 kg)  11/01/18 170 lb (77.1 kg)   Review of Systems  Constitutional: Negative for fever, malaise/fatigue and weight loss.  HENT: Negative.  Negative for nosebleeds.   Eyes: Negative.  Negative for blurred vision, double vision and photophobia.  Respiratory: Negative.  Negative for cough and shortness of breath.   Cardiovascular: Negative.  Negative for chest pain, palpitations and leg swelling.  Gastrointestinal: Negative.  Negative for heartburn, nausea and vomiting.  Musculoskeletal: Negative.  Negative for myalgias.  Neurological: Negative.  Negative for dizziness, focal weakness, seizures and headaches.  Psychiatric/Behavioral: Negative.  Negative for suicidal ideas.    Past Medical History:  Diagnosis Date  . Allergy   . Blood transfusion without reported diagnosis   . Childhood asthma   . COPD (chronic obstructive pulmonary disease) ([place])   . Hyperlipidemia   . Hypertension   . Sarcoidosis     Past Surgical History:  Procedure Laterality Date  . ABDOMINAL HYSTERECTOMY    . ENDOBRONCHIAL ULTRASOUND Bilateral 12/07/2015   Procedure: ENDOBRONCHIAL ULTRASOUND;  Surgeon: Javier Glazier, MD;  Location: [facility];  Service: Cardiopulmonary;  Laterality: Bilateral;    Family History  Problem Relation Age of Onset  . Heart failure Mother   . Diabetes Mother   . Hypertension Mother   . Arthritis Mother   . Brain cancer Sister   . Sickle cell anemia Other   . Brain cancer Other   . Asthma Other   . Rheumatologic disease Neg Hx   . Colon cancer  Neg Hx   . Esophageal cancer Neg Hx   . Stomach cancer Neg Hx   . Rectal cancer Neg Hx     Social History Reviewed with no changes to be made today.   Outpatient Medications Prior to Visit  Medication Sig Dispense Refill  . acetaminophen (TYLENOL) 500 MG tablet Take 1,000 mg by mouth 2 (two) times daily as needed for mild pain or headache.    . albuterol (PROVENTIL HFA;VENTOLIN HFA) 108 (90 Base) MCG/ACT inhaler Inhale 2 puffs into the lungs every 6 (six) hours as needed for wheezing or shortness of breath. 1 Inhaler 2  . budesonide-formoterol (SYMBICORT) 80-4.5 MCG/ACT inhaler Inhale 2 puffs into the lungs 2 (two) times daily. 1 Inhaler 11  . nicotine (NICODERM CQ - DOSED IN MG/24 HR) 7 mg/24hr patch Place 1 patch (7 mg total) onto the skin daily. 28 patch 3  . atorvastatin (LIPITOR) 20 MG tablet TAKE 1 TABLET (20 MG TOTAL) BY MOUTH DAILY. 30 tablet 3  . HYDROCHLOROTHIAZIDE PO Take 25 mg by mouth daily.    . phentermine 37.5 MG capsule Take 1 capsule (37.5 mg total) by mouth every morning. 30 capsule 1   No facility-administered medications prior to visit.     No Known Allergies     Objective:    BP 113/78 (BP Location: Left Arm, Patient Position: Sitting, Cuff Size: Large)   Pulse 88   Temp 98.9 F (37.2 C) (Oral)   Ht 5' 4.5" (1.638 m)   Wt 162 lb 12.8 oz (73.8 kg)   SpO2 98%   BMI 27.51 kg/m  Wt Readings from Last 3 Encounters:  12/23/18 162 lb 12.8 oz (73.8 kg)  12/12/18 170 lb (77.1 kg)  11/01/18 170 lb (77.1 kg)    Physical Exam Vitals signs and nursing note reviewed.  Constitutional:      Appearance: She is well-developed.  HENT:     Head: Normocephalic and atraumatic.  Neck:     Musculoskeletal: Normal range of motion.  Cardiovascular:     Rate and Rhythm: Normal rate and regular rhythm.     Heart sounds: Normal heart sounds. No murmur. No friction rub. No gallop.   Pulmonary:     Effort: Pulmonary effort is normal. No tachypnea or respiratory distress.      Breath sounds: Normal breath sounds. No decreased breath sounds, wheezing, rhonchi or rales.  Chest:     Chest wall: No tenderness.  Abdominal:     General: Bowel sounds are normal.     Palpations: Abdomen is soft.  Musculoskeletal: Normal range of motion.  Skin:    General: Skin is warm and dry.  Neurological:     Mental Status: She is alert and oriented to person, place, and time.     Coordination: Coordination normal.  Psychiatric:        Behavior: Behavior normal. Behavior is cooperative.        Thought Content: Thought content normal.        Judgment: Judgment normal.  Patient has been counseled extensively about nutrition and exercise as well as the importance of adherence with medications and regular follow-up. The patient was given clear instructions to go to ER or return to medical center if symptoms don't improve, worsen or new problems develop. The patient verbalized understanding.   Follow-up: Return in about 3 months (around 03/25/2019) for HTN fasting labs.   Gildardo Pounds, FNP-BC [facility], [facility] [place]   12/23/2018, 4:16 PM

## 2018-12-23 NOTE — Progress Notes (Signed)
Follow up for her cholesterol and her HTN

## 2019-01-15 ENCOUNTER — Telehealth: Payer: Self-pay | Admitting: *Deleted

## 2019-01-15 NOTE — Telephone Encounter (Signed)
Followed up with [facility] who stated the patient was scheduled on 12/1 but was a "no show" for the appointment. Sarah from [facility] reported she called the patient and left a message to ask if she wanted to be rescheduled.

## 2019-01-17 MED FILL — ATORVASTATIN CALCIUM 20 MG: 20 | 30 days supply | Qty: 30 | Fill #1

## 2019-01-28 MED FILL — SYMBICORT 80-4.5 MCG INH: 80-4.5 | 30 days supply | Qty: 10 | Fill #1

## 2019-01-28 MED FILL — HYDROCHLOROTHIAZIDE 25 MG T: 25 | 30 days supply | Qty: 30 | Fill #1

## 2019-02-18 MED FILL — ATORVASTATIN CALCIUM 20 MG: 20 | 30 days supply | Qty: 30 | Fill #2

## 2019-02-20 MED FILL — HYDROCHLOROTHIAZIDE 25 MG T: 25 | 30 days supply | Qty: 30 | Fill #2

## 2019-03-20 MED FILL — HYDROCHLOROTHIAZIDE 25 MG T: 25 | 30 days supply | Qty: 30 | Fill #3

## 2019-03-20 MED FILL — ATORVASTATIN CALCIUM 20 MG: 20 | 30 days supply | Qty: 30 | Fill #3

## 2019-03-20 MED FILL — SYMBICORT 80-4.5 MCG INH: 80-4.5 | 30 days supply | Qty: 10 | Fill #2

## 2019-03-25 ENCOUNTER — Encounter: Payer: Self-pay | Admitting: Nurse Practitioner

## 2019-03-25 ENCOUNTER — Ambulatory Visit: Payer: 59 | Attending: Nurse Practitioner | Admitting: Nurse Practitioner

## 2019-03-25 ENCOUNTER — Other Ambulatory Visit: Payer: Self-pay

## 2019-03-25 VITALS — BP 118/75 | HR 75 | Temp 98.0°F | Ht 64.5 in | Wt 156.0 lb

## 2019-03-25 DIAGNOSIS — I10 Essential (primary) hypertension: Secondary | ICD-10-CM

## 2019-03-25 DIAGNOSIS — A630 Anogenital (venereal) warts: Secondary | ICD-10-CM

## 2019-03-25 DIAGNOSIS — J449 Chronic obstructive pulmonary disease, unspecified: Secondary | ICD-10-CM | POA: Diagnosis not present

## 2019-03-25 DIAGNOSIS — D649 Anemia, unspecified: Secondary | ICD-10-CM

## 2019-03-25 DIAGNOSIS — R7303 Prediabetes: Secondary | ICD-10-CM

## 2019-03-25 DIAGNOSIS — E782 Mixed hyperlipidemia: Secondary | ICD-10-CM

## 2019-03-25 MED ORDER — HYDROCHLOROTHIAZIDE 25 MG PO TABS: 25.0000 mg | ORAL_TABLET | Freq: Every day | ORAL | 1 refills | Status: DC

## 2019-03-25 MED ORDER — ALBUTEROL SULFATE HFA 108 (90 BASE) MCG/ACT IN AERS: 2.0000 | INHALATION_SPRAY | Freq: Four times a day (QID) | RESPIRATORY_TRACT | 1 refills | Status: DC | PRN

## 2019-03-25 MED ORDER — ATORVASTATIN CALCIUM 20 MG PO TABS: 20.0000 mg | ORAL_TABLET | Freq: Every day | ORAL | 1 refills | Status: DC

## 2019-03-25 NOTE — Patient Instructions (Signed)
Human Papillomavirus Human papillomavirus (HPV) is the most common sexually transmitted infection (STI). It easily spreads from person to person (is very contagious). There are many types of HPV. HPV often does not cause symptoms. Sometimes it may cause genital warts that can be seen and felt. There may also be wart-like lesions in the throat. You can have HPV for a long time and not know it. You may spread HPV to others without knowing it. Certain types of HPV may cause cancer. What are the causes? HPV is caused by a virus that spreads from person to person through sex. What increases the risk? You may be more likely to get HPV if you have or have had:  Unprotected sex of any kind.  Several sex partners.  A sex partner who has other sex partners.  Another STI.  A weak disease-fighting system (immune system).  Damaged skin in the genital, oral, or anal area. What are the signs or symptoms? Most people who have HPV do not have any symptoms. If you have symptoms, they may include:  Wart-like lesions in the throat from having oral sex.  Warts on the infected areas.  Genital warts that may itch, burn, bleed, or be painful during sex. How is this treated? There is no treatment for the virus itself. However, there are treatments for the health problems and symptoms HPV can cause. Your doctor may treat HPV by:  Giving medicines that are creams, lotions, liquids, or gels. These medicines may be injected into or put onto genital or anal warts.  Applying one of the following to the genital or anal warts: ? Extreme cold. ? An intense beam of light (laser treatment). ? Extreme heat.  Doing surgery to remove the genital or anal warts. Your doctor will monitor you closely after you are treated. HPV can come back and you may need treatment again. Follow these instructions at home: Medicines  Take over-the-counter and prescription medicines only as told by your doctor.  Do not treat  genital warts with medicines that are meant for hand warts. General instructions  Do not touch or scratch the warts.  Do not have sex while you are being treated.  Do not douche or use tampons during treatment (for women).  Tell your sex partner about your infection. He or she may also need to be treated.  If you get pregnant, tell your doctor that you have HPV. Your doctor will monitor you during the pregnancy.  Keep all follow-up visits as told by your doctor. This is important. How is this prevented?  Talk with your doctor about getting an HPV vaccine. This can prevent some HPV infections and related cancers. You may need 2-3 doses of the vaccine, depending on your age. ? The vaccine will not work if you already have HPV. ? The vaccine is not recommended for pregnant women.  After treatment, use condoms during sex. This helps to prevent future infections.  Have only one sex partner.  Have a sex partner who does not have other sex partners.  Get Pap tests as told by your doctor. Contact a doctor if:  The treated skin gets red, swollen, or painful.  You have a fever.  You feel ill.  You feel lumps or pimples in and around your genital or anal area.  You have bleeding from the vagina.  You have bleeding from the area that was treated.  You have pain during sex. Summary  HPV is the most common STI. It easily spreads  from person to person.  HPV often does not cause any symptoms.  HPV vaccine can prevent some HPV infections and related cancers.  There is no treatment for the virus itself. However, there are treatments for the health problems and symptoms HPV can cause. This information is not intended to replace advice given to you by your health care provider. Make sure you discuss any questions you have with your health care provider. Document Revised: 05/23/2018 Document Reviewed: 09/27/2017 Elsevier Patient Education  [place] Need to Know  About HPV and Cancer HPV (human papillomavirus)is a virus that is passed easily from person to person through sexual contact. It is the most common STI (sexually transmitted infection). There are many types of HPV. Most people who are infected with HPV do not have symptoms. In some cases, HPV can cause warts or lesions in the genital or throat area. Certain types of HPV can also cause cancer. HPV infections can cause various kinds of cancer, including:  Cervical cancer.  Vaginal cancer.  Vulvar cancer.  Anal cancer.  Throat cancer.  Tongue or mouth cancer.  Penile cancer. Some HPV infections go away on their own within 1-2 years, but other HPV infections may cause changes in cells that could lead to cancer. You can take steps to avoid HPV infection and to lower your risk of getting cancer. How does HPV spread? HPV spreads easily through sexual contact. You can get HPV from vaginal sex, oral sex, anal sex, or just by touching someone's genitals. Even people who have only one sexual partner may have HPV. HPV often does not cause symptoms, so most infected people do not know that they have it. How can I prevent HPV infection? Take the following steps to help prevent HPV infection:  Talk with your health care provider about getting the HPV vaccine. This vaccine protects against the types of HPV that could cause cancer.  Limit the number of people you have sex with. Also avoid having sex with people who have had many sexual partners.  Use a latex condom during sex.  Talk with your sexual partners about their health. What can I do to lower my risk of cancer? Having a healthy lifestyle and taking some preventive steps can help lower your cancer risk, whether or not you have HPV. Some steps you can take include: Lifestyle  Practice safe sex to help prevent HPV infection.  Do not use any products that contain nicotine or tobacco, such as cigarettes and e-cigarettes. If you need help  quitting, ask your health care provider.  Eat foods that have antioxidants, such as fruits, vegetables, and grains. Try to eat at least 5 servings of fruits and vegetables every day.  Get regular exercise.  Lose weight if you are overweight.  Practice good oral hygiene. This includes flossing and brushing your teeth every day. Other preventive steps  Get the HPV vaccine as told by your health care provider.  Get tested for STIs even if you do not have symptoms of HPV. You may have HPV and not know it.  If you are a woman, get regular Pap tests. Talk with your health care provider about how often you need these tests. Pap tests will help identify changes in cells that can lead to cancer. Where to find support Ask your health care provider what brochures, flyers, websites, and community resources may be available to you. Where to find more information Learn more about HPV and cancer from:  Centers  for Disease Control and Prevention: http://sweeney-todd.com/  [place]: www.cancer.gov  American Cancer Society: www.cancer.org Contact a health care provider if:  You have genital warts.  You are sexually active and think you may have HPV.  You did not protect yourself during sex and think you may have HPV. Summary  HPV (human papillomavirus)is a virus that is passed easily from person to person through sexual contact.  Certain types of HPV may cause changes in cells that could lead to cancer.  You should take steps to avoid HPV infection, such as limiting the number of people you have sex with, using condoms during sex, and getting the HPV vaccine.  Lifestyle changes can help lower your risk of cancer. These include eating a healthy diet, getting regular exercise, and not using any products that contain nicotine or tobacco.  You may have HPV and not know it. Get tested for STIs even if you do not have symptoms of HPV. If you are a woman, have regular Pap tests as directed  by your health care provider. This information is not intended to replace advice given to you by your health care provider. Make sure you discuss any questions you have with your health care provider. Document Revised: 05/20/2018 Document Reviewed: 10/21/2015 Elsevier Patient Education  [place].

## 2019-03-25 NOTE — Progress Notes (Signed)
Assessment & Plan:  Sarah Cruz was seen today for follow-up.  Diagnoses and all orders for this visit:  Essential hypertension -     hydrochlorothiazide (HYDRODIURIL) 25 MG tablet; Take 1 tablet (25 mg total) by mouth daily. Please fill as a 90 day supply -     CMP14+EGFR Continue all antihypertensives as prescribed.  Remember to bring in your blood pressure log with you for your follow up appointment.  DASH/Mediterranean Diets are healthier choices for HTN.    Condyloma -     Ambulatory referral to Colorectal Surgery  Mixed hyperlipidemia -     atorvastatin (LIPITOR) 20 MG tablet; Take 1 tablet (20 mg total) by mouth daily. Please fill as a 90 day supply -     Lipid panel INSTRUCTIONS: Work on a low fat, heart healthy diet and participate in regular aerobic exercise program by working out at least 150 minutes per week; 5 days a week-30 minutes per day. Avoid red meat/beef/steak,  fried foods. junk foods, sodas, sugary drinks, unhealthy snacking, alcohol and smoking.  Drink at least 80 oz of water per day and monitor your carbohydrate intake daily.    COPD GOLD II/ still smoking  Symptoms well controlled -     albuterol (VENTOLIN HFA) 108 (90 Base) MCG/ACT inhaler; Inhale 2 puffs into the lungs every 6 (six) hours as needed for wheezing or shortness of breath.  Prediabetes -     Hemoglobin A1c  Anemia, unspecified type -     CBC -     CBC     Patient has been counseled on age-appropriate routine health concerns for screening and prevention. These are reviewed and up-to-date. Referrals have been placed accordingly. Immunizations are up-to-date or declined.    Subjective:   Chief Complaint  Patient presents with  . Follow-up    Pt. is here for 3 months follow up.    HPI Sarah Cruz 54 y.o. female presents to office today for follow up  has a past medical history of Allergy, Blood transfusion without reported diagnosis, Childhood asthma, COPD (chronic obstructive  pulmonary disease) ([place]), Hyperlipidemia, Hypertension, and Sarcoidosis.   Essential Hypertension Well controlled. Taking HCTZ 25 mg daily as prescribed. Denies chest pain, shortness of breath, palpitations, lightheadedness, dizziness, headaches or BLE edema.  BP Readings from Last 3 Encounters:  03/25/19 118/75  12/23/18 113/78  12/12/18 123/74   Prediabetes Currently diet controlled.  Lab Results  Component Value Date   HGBA1C 6.2 (H) 03/25/2019   Dyslipidemia Taking atorvastatin 20 mg daily as prescribed. Denies statin intolerance. Tries to maintain low fat, low carb diet.  Lab Results  Component Value Date   LDLCALC 74 03/25/2019   Review of Systems  Constitutional: Negative for fever, malaise/fatigue and weight loss.  HENT: Negative.  Negative for nosebleeds.   Eyes: Negative.  Negative for blurred vision, double vision and photophobia.  Respiratory: Negative.  Negative for cough and shortness of breath.   Cardiovascular: Negative.  Negative for chest pain, palpitations and leg swelling.  Gastrointestinal: Negative.  Negative for heartburn, nausea and vomiting.  Musculoskeletal: Negative.  Negative for myalgias.  Neurological: Negative.  Negative for dizziness, focal weakness, seizures and headaches.  Psychiatric/Behavioral: Negative.  Negative for suicidal ideas.    Past Medical History:  Diagnosis Date  . Allergy   . Blood transfusion without reported diagnosis   . Childhood asthma   . COPD (chronic obstructive pulmonary disease) ([place])   . Hyperlipidemia   . Hypertension   .  Sarcoidosis     Past Surgical History:  Procedure Laterality Date  . ABDOMINAL HYSTERECTOMY    . ENDOBRONCHIAL ULTRASOUND Bilateral 12/07/2015   Procedure: ENDOBRONCHIAL ULTRASOUND;  Surgeon: Javier Glazier, MD;  Location: [facility];  Service: Cardiopulmonary;  Laterality: Bilateral;    Family History  Problem Relation Age of Onset  . Heart failure Mother   . Diabetes Mother     . Hypertension Mother   . Arthritis Mother   . Brain cancer Sister   . Sickle cell anemia Other   . Brain cancer Other   . Asthma Other   . Rheumatologic disease Neg Hx   . Colon cancer Neg Hx   . Esophageal cancer Neg Hx   . Stomach cancer Neg Hx   . Rectal cancer Neg Hx     Social History Reviewed with no changes to be made today.   Outpatient Medications Prior to Visit  Medication Sig Dispense Refill  . acetaminophen (TYLENOL) 500 MG tablet Take 1,000 mg by mouth 2 (two) times daily as needed for mild pain or headache.    . budesonide-formoterol (SYMBICORT) 80-4.5 MCG/ACT inhaler Inhale 2 puffs into the lungs 2 (two) times daily. 1 Inhaler 11  . albuterol (PROVENTIL HFA;VENTOLIN HFA) 108 (90 Base) MCG/ACT inhaler Inhale 2 puffs into the lungs every 6 (six) hours as needed for wheezing or shortness of breath. 1 Inhaler 2  . nicotine (NICODERM CQ - DOSED IN MG/24 HR) 7 mg/24hr patch Place 1 patch (7 mg total) onto the skin daily. (Patient not taking: Reported on 03/25/2019) 28 patch 3  . atorvastatin (LIPITOR) 20 MG tablet Take 1 tablet (20 mg total) by mouth daily. Please fill as a 90 day supply 90 tablet 1  . hydrochlorothiazide (HYDRODIURIL) 25 MG tablet Take 1 tablet (25 mg total) by mouth daily. Please fill as a 90 day supply 90 tablet 1  . phentermine 37.5 MG capsule Take 1 capsule (37.5 mg total) by mouth every morning. 30 capsule 1   No facility-administered medications prior to visit.    No Known Allergies     Objective:    BP 118/75 (BP Location: Left Arm, Patient Position: Sitting, Cuff Size: Normal)   Pulse 75   Temp 98 F (36.7 C) (Temporal)   Ht 5' 4.5" (1.638 m)   Wt 156 lb (70.8 kg)   SpO2 99%   BMI 26.36 kg/m  Wt Readings from Last 3 Encounters:  03/25/19 156 lb (70.8 kg)  12/23/18 162 lb 12.8 oz (73.8 kg)  12/12/18 170 lb (77.1 kg)    Physical Exam Vitals and nursing note reviewed.  Constitutional:      Appearance: She is well-developed.  HENT:      Head: Normocephalic and atraumatic.  Cardiovascular:     Rate and Rhythm: Normal rate and regular rhythm.     Heart sounds: Normal heart sounds. No murmur. No friction rub. No gallop.   Pulmonary:     Effort: Pulmonary effort is normal. No tachypnea or respiratory distress.     Breath sounds: Normal breath sounds. No decreased breath sounds, wheezing, rhonchi or rales.  Chest:     Chest wall: No tenderness.  Abdominal:     General: Bowel sounds are normal.     Palpations: Abdomen is soft.  Musculoskeletal:        General: Normal range of motion.     Cervical back: Normal range of motion.  Skin:    General: Skin is warm and dry.  Neurological:     Mental Status: She is alert and oriented to person, place, and time.     Coordination: Coordination normal.  Psychiatric:        Behavior: Behavior normal. Behavior is cooperative.        Thought Content: Thought content normal.        Judgment: Judgment normal.          Patient has been counseled extensively about nutrition and exercise as well as the importance of adherence with medications and regular follow-up. The patient was given clear instructions to go to ER or return to medical center if symptoms don't improve, worsen or new problems develop. The patient verbalized understanding.   Follow-up: Return for Physical ONLY no labs.   Gildardo Pounds, FNP-BC [facility] and [place], [place]   04/06/2019, 10:52 AM

## 2019-03-26 LAB — CMP14+EGFR
ALT: 27 IU/L (ref 0–32)
AST: 23 IU/L (ref 0–40)
Albumin/Globulin Ratio: 1.6 (ref 1.2–2.2)
Albumin: 4.2 g/dL (ref 3.8–4.9)
Alkaline Phosphatase: 77 IU/L (ref 39–117)
BUN/Creatinine Ratio: 23 (ref 9–23)
BUN: 20 mg/dL (ref 6–24)
Bilirubin Total: <0.2 mg/dL (ref 0.0–1.2)
CO2: 25 mmol/L (ref 20–29)
Calcium: 9.5 mg/dL (ref 8.7–10.2)
Chloride: 100 mmol/L (ref 96–106)
Creatinine, Ser: 0.86 mg/dL (ref 0.57–1.00)
GFR calc Af Amer: 89 mL/min/{1.73_m2} (ref >59)
GFR calc non Af Amer: 77 mL/min/{1.73_m2} (ref >59)
Globulin, Total: 2.6 g/dL (ref 1.5–4.5)
Glucose: 85 mg/dL (ref 65–99)
Potassium: 4.2 mmol/L (ref 3.5–5.2)
Sodium: 138 mmol/L (ref 134–144)
Total Protein: 6.8 g/dL (ref 6.0–8.5)

## 2019-03-26 LAB — CBC
Hematocrit: 36.9 % (ref 34.0–46.6)
Hemoglobin: 12 g/dL (ref 11.1–15.9)
MCH: 23.5 pg — ABNORMAL LOW (ref 26.6–33.0)
MCHC: 32.5 g/dL (ref 31.5–35.7)
MCV: 72 fL — ABNORMAL LOW (ref 79–97)
Platelets: 342 10*3/uL (ref 150–450)
RBC: 5.1 x10E6/uL (ref 3.77–5.28)
RDW: 14 % (ref 11.7–15.4)
WBC: 5.6 10*3/uL (ref 3.4–10.8)

## 2019-03-26 LAB — LIPID PANEL
Chol/HDL Ratio: 2.3 ratio (ref 0.0–4.4)
Cholesterol, Total: 161 mg/dL (ref 100–199)
HDL: 69 mg/dL (ref >39)
LDL Chol Calc (NIH): 74 mg/dL (ref 0–99)
Triglycerides: 100 mg/dL (ref 0–149)
VLDL Cholesterol Cal: 18 mg/dL (ref 5–40)

## 2019-03-26 LAB — HEMOGLOBIN A1C
Est. average glucose Bld gHb Est-mCnc: 131 mg/dL
Hgb A1c MFr Bld: 6.2 % — ABNORMAL HIGH (ref 4.8–5.6)

## 2019-03-26 MED FILL — VENTOLIN HFA 90 MCG INHALER: 108 (90 BAS | 25 days supply | Qty: 18 | Fill #0

## 2019-03-31 ENCOUNTER — Telehealth: Payer: Self-pay

## 2019-03-31 NOTE — Telephone Encounter (Signed)
Pt called regarding her results/ DOB and name verified  Results and below results NP note was given to the patient. Verbalized understanding   "Kidney, liver function and electrolytes are normal. Cholesterol levels are normal. A1c still showing prediabetes. CBC shows mild anemia. Continue all medications as prescribed. Take a Multi vitamin Daily to help improve your anemia"

## 2019-04-06 ENCOUNTER — Encounter: Payer: Self-pay | Admitting: Nurse Practitioner

## 2019-04-21 MED FILL — ATORVASTATIN CALCIUM 20 MG: 20 | 30 days supply | Qty: 30 | Fill #0

## 2019-04-21 MED FILL — HYDROCHLOROTHIAZIDE 25 MG T: 25 | 30 days supply | Qty: 30 | Fill #4

## 2019-04-22 ENCOUNTER — Other Ambulatory Visit: Payer: Self-pay

## 2019-04-22 ENCOUNTER — Encounter: Payer: Self-pay | Admitting: Nurse Practitioner

## 2019-04-22 ENCOUNTER — Ambulatory Visit: Payer: 59 | Attending: Nurse Practitioner | Admitting: Nurse Practitioner

## 2019-04-22 VITALS — BP 117/80 | HR 74 | Temp 98.1°F | Ht 64.0 in | Wt 161.0 lb

## 2019-04-22 DIAGNOSIS — E663 Overweight: Secondary | ICD-10-CM | POA: Diagnosis not present

## 2019-04-22 DIAGNOSIS — Z0000 Encounter for general adult medical examination without abnormal findings: Secondary | ICD-10-CM | POA: Diagnosis not present

## 2019-04-22 MED ORDER — PHENTERMINE HCL 37.5 MG PO TABS: 37.5000 mg | ORAL_TABLET | Freq: Every day | ORAL | 2 refills | Status: DC

## 2019-04-22 NOTE — Progress Notes (Signed)
Assessment & Plan:  Sarah Cruz was seen today for annual exam.  Diagnoses and all orders for this visit:  Encounter for annual physical exam  Overweight (BMI 25.0-29.9) -     phentermine (ADIPEX-P) 37.5 MG tablet; Take 1 tablet (37.5 mg total) by mouth daily before breakfast.   Patient has been counseled on age-appropriate routine health concerns for screening and prevention. These are reviewed and up-to-date. Referrals have been placed accordingly. Immunizations are up-to-date or declined.    Subjective:   Chief Complaint  Patient presents with  . Annual Exam    Pt. is here for a physical.    HPI Sarah Cruz 54 y.o. female presents to office today for annual physical.   Doing well with phentermine. She has lost over 20lbs since last August. Blood pressure is well controlled.     Review of Systems  Constitutional: Negative for fever, malaise/fatigue and weight loss.  HENT: Negative.  Negative for nosebleeds.   Eyes: Negative.  Negative for blurred vision, double vision and photophobia.  Respiratory: Negative.  Negative for cough and shortness of breath.   Cardiovascular: Negative.  Negative for chest pain, palpitations and leg swelling.  Gastrointestinal: Negative.  Negative for heartburn, nausea and vomiting.  Genitourinary: Negative.   Musculoskeletal: Negative.  Negative for myalgias.  Skin: Negative.   Neurological: Negative.  Negative for dizziness, focal weakness, seizures and headaches.  Endo/Heme/Allergies: Negative.   Psychiatric/Behavioral: Negative.  Negative for suicidal ideas.    Past Medical History:  Diagnosis Date  . Allergy   . Blood transfusion without reported diagnosis   . Childhood asthma   . COPD (chronic obstructive pulmonary disease) ([place])   . Hyperlipidemia   . Hypertension   . Sarcoidosis     Past Surgical History:  Procedure Laterality Date  . ABDOMINAL HYSTERECTOMY    . ENDOBRONCHIAL ULTRASOUND Bilateral 12/07/2015   Procedure:  ENDOBRONCHIAL ULTRASOUND;  Surgeon: Javier Glazier, MD;  Location: [facility];  Service: Cardiopulmonary;  Laterality: Bilateral;    Family History  Problem Relation Age of Onset  . Heart failure Mother   . Diabetes Mother   . Hypertension Mother   . Arthritis Mother   . Brain cancer Sister   . Sickle cell anemia Other   . Brain cancer Other   . Asthma Other   . Rheumatologic disease Neg Hx   . Colon cancer Neg Hx   . Esophageal cancer Neg Hx   . Stomach cancer Neg Hx   . Rectal cancer Neg Hx     Social History Reviewed with no changes to be made today.   Outpatient Medications Prior to Visit  Medication Sig Dispense Refill  . acetaminophen (TYLENOL) 500 MG tablet Take 1,000 mg by mouth 2 (two) times daily as needed for mild pain or headache.    . albuterol (VENTOLIN HFA) 108 (90 Base) MCG/ACT inhaler Inhale 2 puffs into the lungs every 6 (six) hours as needed for wheezing or shortness of breath. 18 g 1  . atorvastatin (LIPITOR) 20 MG tablet Take 1 tablet (20 mg total) by mouth daily. Please fill as a 90 day supply 90 tablet 1  . budesonide-formoterol (SYMBICORT) 80-4.5 MCG/ACT inhaler Inhale 2 puffs into the lungs 2 (two) times daily. 1 Inhaler 11  . hydrochlorothiazide (HYDRODIURIL) 25 MG tablet Take 1 tablet (25 mg total) by mouth daily. Please fill as a 90 day supply 90 tablet 1  . nicotine (NICODERM CQ - DOSED IN MG/24 HR) 7 mg/24hr patch Place  1 patch (7 mg total) onto the skin daily. 28 patch 3   No facility-administered medications prior to visit.    No Known Allergies     Objective:    BP 117/80 (BP Location: Left Arm, Patient Position: Sitting, Cuff Size: Normal)   Pulse 74   Temp 98.1 F (36.7 C) (Temporal)   Ht 5\' 4"  (1.626 m)   Wt 161 lb (73 kg)   SpO2 98%   BMI 27.64 kg/m  Wt Readings from Last 3 Encounters:  04/22/19 161 lb (73 kg)  03/25/19 156 lb (70.8 kg)  12/23/18 162 lb 12.8 oz (73.8 kg)    Physical Exam Constitutional:      Appearance:  She is well-developed.  HENT:     Head: Normocephalic and atraumatic.     Right Ear: External ear normal.     Left Ear: External ear normal.     Nose: Nose normal.     Mouth/Throat:     Pharynx: No oropharyngeal exudate.  Eyes:     General: No scleral icterus.       Right eye: No discharge.     Conjunctiva/sclera: Conjunctivae normal.     Pupils: Pupils are equal, round, and reactive to light.  Neck:     Thyroid: No thyromegaly.     Trachea: No tracheal deviation.  Cardiovascular:     Rate and Rhythm: Normal rate and regular rhythm.     Heart sounds: Normal heart sounds. No murmur. No friction rub.  Pulmonary:     Effort: Pulmonary effort is normal. No accessory muscle usage or respiratory distress.     Breath sounds: Normal breath sounds. No decreased breath sounds, wheezing, rhonchi or rales.  Chest:     Chest wall: No tenderness.     Breasts: Breasts are symmetrical.        Right: No inverted nipple, mass, nipple discharge, skin change or tenderness.        Left: No inverted nipple, mass, nipple discharge, skin change or tenderness.  Abdominal:     General: Bowel sounds are normal. There is no distension.     Palpations: Abdomen is soft. There is no mass.     Tenderness: There is no abdominal tenderness. There is no guarding or rebound.  Musculoskeletal:        General: No tenderness or deformity. Normal range of motion.     Cervical back: Normal range of motion and neck supple.  Lymphadenopathy:     Cervical: No cervical adenopathy.  Skin:    General: Skin is warm and dry.     Findings: No erythema.  Neurological:     Mental Status: She is alert and oriented to person, place, and time.     Cranial Nerves: No cranial nerve deficit.     Coordination: Coordination normal.     Deep Tendon Reflexes: Reflexes are normal and symmetric.  Psychiatric:        Speech: Speech normal.        Behavior: Behavior normal.        Thought Content: Thought content normal.         Judgment: Judgment normal.          Patient has been counseled extensively about nutrition and exercise as well as the importance of adherence with medications and regular follow-up. The patient was given clear instructions to go to ER or return to medical center if symptoms don't improve, worsen or new problems develop. The patient verbalized understanding.  Follow-up: Return in about 6 months (around 10/23/2019) for BP recheck.   Gildardo Pounds, FNP-BC [facility] [place], [place]   05/10/2019, 8:32 PM

## 2019-05-10 ENCOUNTER — Encounter: Payer: Self-pay | Admitting: Nurse Practitioner

## 2019-05-19 MED FILL — HYDROCHLOROTHIAZIDE 25 MG T: 25 | 30 days supply | Qty: 30 | Fill #5

## 2019-05-19 MED FILL — ATORVASTATIN CALCIUM 20 MG: 20 | 30 days supply | Qty: 30 | Fill #1

## 2019-05-19 MED FILL — SYMBICORT 80-4.5 MCG INH: 80-4.5 | 30 days supply | Qty: 10 | Fill #3

## 2019-06-19 MED FILL — HYDROCHLOROTHIAZIDE 25 MG T: 25 | 30 days supply | Qty: 30 | Fill #0

## 2019-06-19 MED FILL — ATORVASTATIN CALCIUM 20 MG: 20 | 30 days supply | Qty: 30 | Fill #2

## 2019-07-11 ENCOUNTER — Other Ambulatory Visit: Payer: Self-pay | Admitting: Internal Medicine

## 2019-07-11 MED FILL — SYMBICORT 80-4.5 MCG INH: 80-4.5 | 30 days supply | Qty: 10 | Fill #0

## 2019-08-21 MED FILL — HYDROCHLOROTHIAZIDE 25 MG T: 25 | 30 days supply | Qty: 30 | Fill #2

## 2019-08-21 MED FILL — ATORVASTATIN CALCIUM 20 MG: 20 | 30 days supply | Qty: 30 | Fill #4

## 2019-09-10 ENCOUNTER — Ambulatory Visit: Payer: 59 | Attending: Nurse Practitioner | Admitting: Nurse Practitioner

## 2019-09-10 ENCOUNTER — Other Ambulatory Visit: Payer: Self-pay

## 2019-09-10 DIAGNOSIS — G5603 Carpal tunnel syndrome, bilateral upper limbs: Secondary | ICD-10-CM

## 2019-09-10 MED ORDER — IBUPROFEN 600 MG PO TABS: 600.0000 mg | ORAL_TABLET | Freq: Three times a day (TID) | ORAL | 1 refills | Status: DC | PRN

## 2019-09-10 MED FILL — IBUPROFEN 600 MG TABLET: 600 | 20 days supply | Qty: 60 | Fill #0

## 2019-09-10 MED FILL — SYMBICORT 80-4.5 MCG INH: 80-4.5 | 30 days supply | Qty: 10 | Fill #1

## 2019-09-10 NOTE — Progress Notes (Signed)
Virtual Visit via Telephone Note Due to national recommendations of social distancing due to [place] 19, telehealth visit is felt to be most appropriate for this patient at this time.  I discussed the limitations, risks, security and privacy concerns of performing an evaluation and management service by telephone and the availability of in person appointments. I also discussed with the patient that there may be a patient responsible charge related to this service. The patient expressed understanding and agreed to proceed.    I connected with Adalene L Xie on 09/10/19  at   3:10 PM EDT  EDT by telephone and verified that I am speaking with the correct person using two identifiers.   Consent I discussed the limitations, risks, security and privacy concerns of performing an evaluation and management service by telephone and the availability of in person appointments. I also discussed with the patient that there may be a patient responsible charge related to this service. The patient expressed understanding and agreed to proceed.   Location of Patient: Private Residence    Location of Provider: [place] and [place] participating in Telemedicine visit: Geryl Rankins FNP-BC [place]    History of Present Illness: Telemedicine visit for: Bilateral Hand Pain   Carpal Tunnel Syndrome: Patient presents for presents evaluation of pain in hands, hand paresthesias and possible carpal tunnel syndrome.  Onset of the symptoms was several months ago. Current symptoms include tingling, pain and numbness in the bilateral hands. R>L. . 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 Patient's course of YK:[id number] worsening. Evaluation to date: none. Treatment to date: OTC NSAIDs, CBD oils.   Past Medical History:  Diagnosis Date  .  Allergy   . Blood transfusion without reported diagnosis   . Childhood asthma   . COPD (chronic obstructive pulmonary disease) ([place])   . Hyperlipidemia   . Hypertension   . Sarcoidosis     Past Surgical History:  Procedure Laterality Date  . ABDOMINAL HYSTERECTOMY    . ENDOBRONCHIAL ULTRASOUND Bilateral 12/07/2015   Procedure: ENDOBRONCHIAL ULTRASOUND;  Surgeon: Javier Glazier, MD;  Location: [facility];  Service: Cardiopulmonary;  Laterality: Bilateral;    Family History  Problem Relation Age of Onset  . Heart failure Mother   . Diabetes Mother   . Hypertension Mother   . Arthritis Mother   . Brain cancer Sister   . Sickle cell anemia Other   . Brain cancer Other   . Asthma Other   . Rheumatologic disease Neg Hx   . Colon cancer Neg Hx   . Esophageal cancer Neg Hx   . Stomach cancer Neg Hx   . Rectal cancer Neg Hx     Social History   Socioeconomic History  . Marital status: Divorced    Spouse name: Not on file  . Number of children: 3  . Years of education: Not on file  . Highest education level: Not on file  Occupational History  . Not on file  Tobacco Use  . Smoking status: Current Every Day Smoker    Packs/day: 0.50    Years: 38.00    Pack years: 19.00    Types: Cigarettes    Start date: 04/08/1977  . Smokeless tobacco: Never Used  . Tobacco comment: down to 0.5ppd  Vaping Use  . Vaping Use: Never used  Substance and Sexual Activity  . Alcohol use:  No    Comment: Remote EtOH  . 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 Sexual activity: Not Currently  Other Topics Concern  . Not on file  Social History Narrative   Originally from [place], [place]. Moved to [facility] in November 2016. Prior travel to [place]. No travel outside the [place]. 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 No bird, mold, or hot tub exposure. No history of volunteer work or residence in a homeless shelter. No history of incarceration. Does have prior  exposure to a nephew with TB but has always had a negative PPD skin test.    Social Determinants of Health   Financial Resource Strain:   . Difficulty of Paying Living Expenses:   Food Insecurity:   . Worried About Charity fundraiser in the Last Year:   . Arboriculturist in the Last Year:   Transportation Needs:   . Film/video editor (Medical):   Marland Kitchen Lack of Transportation (Non-Medical):   Physical Activity:   . Days of Exercise per Week:   . Minutes of Exercise per Session:   Stress:   . Feeling of Stress :   Social Connections:   . Frequency of Communication with Friends and Family:   . Frequency of Social Gatherings with Friends and Family:   . Attends Religious Services:   . Active Member of Clubs or Organizations:   . Attends Archivist Meetings:   Marland Kitchen Marital Status:      Observations/Objective: Awake, alert and oriented x 3   ROS  Assessment and Plan: Quintana was seen today for hand pain.  Diagnoses and all orders for this visit:  Bilateral carpal tunnel syndrome -     ibuprofen (ADVIL) 600 MG tablet; Take 1 tablet (600 mg total) by mouth every 8 (eight) hours as needed.     Follow Up Instructions Return in about 4 weeks (around 10/08/2019).     I discussed the assessment and treatment plan with the patient. The patient was provided an opportunity to ask questions and all were answered. The patient agreed with the plan and demonstrated an understanding of the instructions.   The patient was advised to call back or seek an in-person evaluation if the symptoms worsen or if the condition fails to improve as anticipated.  I provided 14 minutes of non-face-to-face time during this encounter including median intraservice time, reviewing previous notes, labs, imaging, medications and explaining diagnosis and management.  Gildardo Pounds, FNP-BC

## 2019-09-11 ENCOUNTER — Encounter: Payer: Self-pay | Admitting: Nurse Practitioner

## 2019-09-18 MED FILL — ATORVASTATIN CALCIUM 20 MG: 20 | 30 days supply | Qty: 30 | Fill #5

## 2019-09-18 MED FILL — HYDROCHLOROTHIAZIDE 25 MG T: 25 | 30 days supply | Qty: 30 | Fill #3

## 2019-10-08 ENCOUNTER — Ambulatory Visit: Payer: 59 | Attending: Nurse Practitioner | Admitting: Nurse Practitioner

## 2019-10-08 ENCOUNTER — Other Ambulatory Visit: Payer: Self-pay

## 2019-10-08 VITALS — BP 118/75 | HR 67 | Temp 97.7°F | Wt 162.0 lb

## 2019-10-08 DIAGNOSIS — Z1159 Encounter for screening for other viral diseases: Secondary | ICD-10-CM | POA: Diagnosis not present

## 2019-10-08 DIAGNOSIS — Z1231 Encounter for screening mammogram for malignant neoplasm of breast: Secondary | ICD-10-CM

## 2019-10-08 DIAGNOSIS — G5603 Carpal tunnel syndrome, bilateral upper limbs: Secondary | ICD-10-CM | POA: Diagnosis not present

## 2019-10-08 DIAGNOSIS — R7303 Prediabetes: Secondary | ICD-10-CM

## 2019-10-08 DIAGNOSIS — I10 Essential (primary) hypertension: Secondary | ICD-10-CM

## 2019-10-08 LAB — GLUCOSE, POCT (MANUAL RESULT ENTRY): POC Glucose: 93 mg/dl (ref 70–99)

## 2019-10-08 NOTE — Progress Notes (Signed)
Assessment & Plan:  Sarah Cruz was seen today for follow-up.  Diagnoses and all orders for this visit:  Bilateral carpal tunnel syndrome -     Ambulatory referral to Hand Surgery Bilateral hand splints x 6 weeks (ineffective)  Prediabetes -     Hemoglobin A1c -     Glucose (CBG)  Need for hepatitis C screening test -     Hepatitis C Antibody  Essential hypertension -     Basic metabolic panel Continue all antihypertensives as prescribed.  Remember to bring in your blood pressure log with you for your follow up appointment.  DASH/Mediterranean Diets are healthier choices for HTN.    Breast cancer screening by mammogram -     MM 3D SCREEN BREAST BILATERAL; Future    Patient has been counseled on age-appropriate routine health concerns for screening and prevention. These are reviewed and up-to-date. Referrals have been placed accordingly. Immunizations are up-to-date or declined.    Subjective:   Chief Complaint  Patient presents with  . Follow-up    Pt. is here for hand pain follow up. Pt. stated her right hand still hurts and the Ibuprofen helps a little bit.    HPI Sarah Cruz 54 y.o. female presents to office today for follow up to Carpal Tunnel Syndrome. We had a televisit last month for her bilateral hand pain and paresthesias. Onset of the symptoms was several months ago. Current symptoms include tingling, pain and numbness in the bilateral hands. R>L. . 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 Patient's course of ZS:[id number] worsening. Evaluation to date: none. Treatment to date: OTC NSAIDs, CBD oils.  Essential Hypertension Well controlled. Taking HCTZ 25 mg daily as prescribed. Denies chest pain, shortness of breath, palpitations, lightheadedness, dizziness, headaches or BLE edema.  BP Readings from Last 3 Encounters:  10/08/19 118/75    04/22/19 117/80  03/25/19 118/75    Review of Systems  Constitutional: Negative for fever, malaise/fatigue and weight loss.  HENT: Negative.  Negative for nosebleeds.   Eyes: Negative.  Negative for blurred vision, double vision and photophobia.  Respiratory: Negative.  Negative for cough and shortness of breath.   Cardiovascular: Negative.  Negative for chest pain, palpitations and leg swelling.  Gastrointestinal: Negative.  Negative for heartburn, nausea and vomiting.  Musculoskeletal: Positive for joint pain. Negative for myalgias.  Neurological: Positive for tingling and sensory change. Negative for dizziness, focal weakness, seizures and headaches.  Psychiatric/Behavioral: Negative.  Negative for suicidal ideas.    Past Medical History:  Diagnosis Date  . Allergy   . Blood transfusion without reported diagnosis   . Childhood asthma   . COPD (chronic obstructive pulmonary disease) ([place])   . Hyperlipidemia   . Hypertension   . Sarcoidosis     Past Surgical History:  Procedure Laterality Date  . ABDOMINAL HYSTERECTOMY    . ENDOBRONCHIAL ULTRASOUND Bilateral 12/07/2015   Procedure: ENDOBRONCHIAL ULTRASOUND;  Surgeon: Javier Glazier, MD;  [facility];  Service: Cardiopulmonary;  Laterality: Bilateral;    Family History  Problem Relation Age of Onset  . Heart failure Mother   . Diabetes Mother   . Hypertension Mother   . Arthritis Mother   . Brain cancer Sister   . Sickle cell anemia Other   . Brain cancer Other   . Asthma Other   . Rheumatologic disease Neg Hx   . Colon cancer Neg Hx   .  Esophageal cancer Neg Hx   . Stomach cancer Neg Hx   . Rectal cancer Neg Hx     Social History Reviewed with no changes to be made today.   Outpatient Medications Prior to Visit  Medication Sig Dispense Refill  . acetaminophen (TYLENOL) 500 MG tablet Take 1,000 mg by mouth 2 (two) times daily as needed for mild pain or headache.    . albuterol (VENTOLIN HFA) 108  (90 Base) MCG/ACT inhaler Inhale 2 puffs into the lungs every 6 (six) hours as needed for wheezing or shortness of breath. 18 g 1  . ibuprofen (ADVIL) 600 MG tablet Take 1 tablet (600 mg total) by mouth every 8 (eight) hours as needed. 60 tablet 1  . nicotine (NICODERM CQ - DOSED IN MG/24 HR) 7 mg/24hr patch Place 1 patch (7 mg total) onto the skin daily. 28 patch 3  . SYMBICORT 80-4.5 MCG/ACT inhaler INHALE 2 PUFFS INTO THE LUNGS TWICE DAILY. 10.2 g 3  . atorvastatin (LIPITOR) 20 MG tablet Take 1 tablet (20 mg total) by mouth daily. Please fill as a 90 day supply 90 tablet 1  . hydrochlorothiazide (HYDRODIURIL) 25 MG tablet Take 1 tablet (25 mg total) by mouth daily. Please fill as a 90 day supply 90 tablet 1  . phentermine (ADIPEX-P) 37.5 MG tablet Take 1 tablet (37.5 mg total) by mouth daily before breakfast. (Patient not taking: Reported on 09/10/2019) 30 tablet 2   No facility-administered medications prior to visit.    No Known Allergies     Objective:    BP 118/75 (BP Location: Left Arm, Patient Position: Sitting, Cuff Size: Normal)   Pulse 67   Temp 97.7 F (36.5 C) (Temporal)   Wt 162 lb (73.5 kg)   SpO2 100%   BMI 27.81 kg/m  Wt Readings from Last 3 Encounters:  10/08/19 162 lb (73.5 kg)  04/22/19 161 lb (73 kg)  03/25/19 156 lb (70.8 kg)    Physical Exam Vitals and nursing note reviewed.  Constitutional:      Appearance: She is well-developed.  HENT:     Head: Normocephalic and atraumatic.  Cardiovascular:     Rate and Rhythm: Normal rate and regular rhythm.     Heart sounds: Normal heart sounds. No murmur heard.  No friction rub. No gallop.   Pulmonary:     Effort: Pulmonary effort is normal. No tachypnea or respiratory distress.     Breath sounds: Normal breath sounds. No decreased breath sounds, wheezing, rhonchi or rales.  Chest:     Chest wall: No tenderness.  Abdominal:     General: Bowel sounds are normal.     Palpations: Abdomen is soft.    Musculoskeletal:        General: Normal range of motion.     Cervical back: Normal range of motion.  Skin:    General: Skin is warm and dry.  Neurological:     Mental Status: She is alert and oriented to person, place, and time.     Coordination: Coordination normal.  Psychiatric:        Behavior: Behavior normal. Behavior is cooperative.        Thought Content: Thought content normal.        Judgment: Judgment normal.          Patient has been counseled extensively about nutrition and exercise as well as the importance of adherence with medications and regular follow-up. The patient was given clear instructions to go to ER or  return to medical center if symptoms don't improve, worsen or new problems develop. The patient verbalized understanding.   Follow-up: No follow-ups on file.   Gildardo Pounds, FNP-BC [facility] [place], [place]   10/13/2019, 3:55 PM

## 2019-10-09 ENCOUNTER — Encounter: Payer: Self-pay | Admitting: Nurse Practitioner

## 2019-10-09 LAB — BASIC METABOLIC PANEL
BUN/Creatinine Ratio: 21 (ref 9–23)
BUN: 17 mg/dL (ref 6–24)
CO2: 23 mmol/L (ref 20–29)
Calcium: 9.7 mg/dL (ref 8.7–10.2)
Chloride: 102 mmol/L (ref 96–106)
Creatinine, Ser: 0.82 mg/dL (ref 0.57–1.00)
GFR calc Af Amer: 94 mL/min/{1.73_m2} (ref >59)
GFR calc non Af Amer: 81 mL/min/{1.73_m2} (ref >59)
Glucose: 87 mg/dL (ref 65–99)
Potassium: 4.7 mmol/L (ref 3.5–5.2)
Sodium: 138 mmol/L (ref 134–144)

## 2019-10-09 LAB — HEMOGLOBIN A1C
Est. average glucose Bld gHb Est-mCnc: 126 mg/dL
Hgb A1c MFr Bld: 6 % — ABNORMAL HIGH (ref 4.8–5.6)

## 2019-10-09 LAB — HEPATITIS C ANTIBODY: Hep C Virus Ab: <0.1 s/co ratio (ref 0.0–0.9)

## 2019-10-13 ENCOUNTER — Encounter: Payer: Self-pay | Admitting: Nurse Practitioner

## 2019-10-14 MED FILL — IBUPROFEN 600 MG TABLET: 600 | 20 days supply | Qty: 60 | Fill #1

## 2019-10-16 MED FILL — HYDROCHLOROTHIAZIDE 25 MG T: 25 | 30 days supply | Qty: 30 | Fill #4

## 2019-10-17 MED FILL — ATORVASTATIN CALCIUM 20 MG: 20 | 30 days supply | Qty: 30 | Fill #0

## 2019-11-12 ENCOUNTER — Ambulatory Visit: Payer: 59 | Admitting: Nurse Practitioner

## 2019-11-19 MED FILL — HYDROCHLOROTHIAZIDE 25 MG T: 25 | 30 days supply | Qty: 30 | Fill #5

## 2019-11-19 MED FILL — ATORVASTATIN CALCIUM 20 MG: 20 | 30 days supply | Qty: 30 | Fill #1

## 2019-11-25 ENCOUNTER — Encounter: Payer: Self-pay | Admitting: Orthopaedic Surgery

## 2019-11-25 ENCOUNTER — Ambulatory Visit (INDEPENDENT_AMBULATORY_CARE_PROVIDER_SITE_OTHER): Payer: 59 | Admitting: Orthopaedic Surgery

## 2019-11-25 ENCOUNTER — Other Ambulatory Visit: Payer: Self-pay | Admitting: Orthopaedic Surgery

## 2019-11-25 ENCOUNTER — Ambulatory Visit: Payer: Self-pay

## 2019-11-25 DIAGNOSIS — M1812 Unilateral primary osteoarthritis of first carpometacarpal joint, left hand: Secondary | ICD-10-CM | POA: Diagnosis not present

## 2019-11-25 DIAGNOSIS — M1811 Unilateral primary osteoarthritis of first carpometacarpal joint, right hand: Secondary | ICD-10-CM | POA: Diagnosis not present

## 2019-11-25 MED ORDER — CELECOXIB 200 MG PO CAPS
200.0000 mg | ORAL_CAPSULE | Freq: Two times a day (BID) | ORAL | 3 refills | Status: DC
Start: 2019-11-25 — End: 2020-02-23

## 2019-11-25 MED FILL — CELECOXIB 200 MG CAP: 200 | 15 days supply | Qty: 30 | Fill #0

## 2019-11-25 NOTE — Progress Notes (Signed)
Office Visit Note   Patient: Sarah Cruz           Date of Birth: [birth year]           MRN: [id number] Visit Date: 11/25/2019              Requested by: Gildardo Pounds, NP [place],  [place] [address] PCP: Gildardo Pounds, NP   Assessment & Plan: Visit Diagnoses:  1. Primary osteoarthritis of first carpometacarpal joint of right hand   2. Primary osteoarthritis of first carpometacarpal joint of left hand     Plan: Impression is bilateral basal joint arthritis.  We will start with some supportive braces as well as a prescription for Celebrex.  Have also recommended trying over-the-counter Voltaren gel.  Questions encouraged and answered.  Patient will return to if she decides to try cortisone injection.  Follow-Up Instructions: Return if symptoms worsen or fail to improve.   Orders:  Orders Placed This Encounter  Procedures  . XR Hand Complete Left  . XR Hand Complete Right   Meds ordered this encounter  Medications  . celecoxib (CELEBREX) 200 MG capsule    Sig: Take 1 capsule (200 mg total) by mouth 2 (two) times daily.    Dispense:  30 capsule    Refill:  3      Procedures: No procedures performed   Clinical Data: No additional findings.   Subjective: Chief Complaint  Patient presents with  . Left Hand - Pain  . Right Hand - Pain    Patient is a pleasant 54 year old female comes in for evaluation of bilateral hand pain.  Referral from PCPs office.  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  She is right-handed and is more symptomatic on the right hand.  She has some swelling and some tingling but does not have any numbness or sensation of falling asleep.  She states that it hurts to swipe on her phone.  She has trouble turning lids.  She has been taken Tylenol.  No other medications   Review of Systems  Constitutional: Negative.   HENT: Negative.    Eyes: Negative.   Respiratory: Negative.   Cardiovascular: Negative.   Endocrine: Negative.   Musculoskeletal: Negative.   Neurological: Negative.   Hematological: Negative.   Psychiatric/Behavioral: Negative.   All other systems reviewed and are negative.    Objective: Vital Signs: There were no vitals taken for this visit.  Physical Exam Vitals and nursing note reviewed.  Constitutional:      Appearance: She is well-developed.  HENT:     Head: Normocephalic and atraumatic.  Pulmonary:     Effort: Pulmonary effort is normal.  Abdominal:     Palpations: Abdomen is soft.  Musculoskeletal:     Cervical back: Neck supple.  Skin:    General: Skin is warm.     Capillary Refill: Capillary refill takes less than 2 seconds.  Neurological:     Mental Status: She is alert and oriented to person, place, and time.  Psychiatric:        Behavior: Behavior normal.        Thought Content: Thought content normal.        Judgment: Judgment normal.     Ortho Exam Bilateral hands show prominent thumb CMC joints.  There is no subluxation of the basal joint.  Positive grind test.  Negative Finkelstein's.  Negative carpal tunnel compressive signs.  No neurovascular compromise. Specialty Comments:  No specialty comments available.  Imaging: XR Hand Complete Left  Result Date: 11/25/2019 Advanced degenerative changes of the basal joint.  XR Hand Complete Right  Result Date: 11/25/2019 Advanced degenerative changes of the basal joint    PMFS History: Patient Active Problem List   Diagnosis Date Noted  . Primary osteoarthritis of first carpometacarpal joint of right hand 11/25/2019  . Primary osteoarthritis of first carpometacarpal joint of left hand 11/25/2019  . Cigarette smoker 06/18/2018  . PAD (peripheral artery disease) ([place]) 04/09/2018  . Sarcoidosis 12/29/2015  . COPD GOLD II/ still smoking  12/29/2015  . Essential hypertension 10/22/2015  . Childhood asthma  10/22/2015  . Dyspnea 10/22/2015  . Exposure to TB 10/22/2015  . Multiple lung nodules on CT 10/16/2015  . Mediastinal adenopathy 10/16/2015   Past Medical History:  Diagnosis Date  . Allergy   . Blood transfusion without reported diagnosis   . Childhood asthma   . COPD (chronic obstructive pulmonary disease) ([place])   . Hyperlipidemia   . Hypertension   . Sarcoidosis     Family History  Problem Relation Age of Onset  . Heart failure Mother   . Diabetes Mother   . Hypertension Mother   . Arthritis Mother   . Brain cancer Sister   . Sickle cell anemia Other   . Brain cancer Other   . Asthma Other   . Rheumatologic disease Neg Hx   . Colon cancer Neg Hx   . Esophageal cancer Neg Hx   . Stomach cancer Neg Hx   . Rectal cancer Neg Hx     Past Surgical History:  Procedure Laterality Date  . ABDOMINAL HYSTERECTOMY    . ENDOBRONCHIAL ULTRASOUND Bilateral 12/07/2015   Procedure: ENDOBRONCHIAL ULTRASOUND;  Surgeon: Javier Glazier, MD;  [facility]: [facility];  Service: Cardiopulmonary;  Laterality: Bilateral;   Social History   Occupational History  . Not on file  Tobacco Use  . Smoking status: Current Every Day Smoker    Packs/day: 0.50    Years: 38.00    Pack years: 19.00    Types: Cigarettes    Start date: 04/08/1977  . Smokeless tobacco: Never Used  . Tobacco comment: down to 0.5ppd  Vaping Use  . Vaping Use: Never used  Substance and Sexual Activity  . Alcohol use: No    Comment: Remote EtOH  . 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 Sexual activity: Not Currently

## 2019-12-01 MED FILL — SYMBICORT 80-4.5 MCG INH: 80-4.5 | 30 days supply | Qty: 10 | Fill #2

## 2019-12-17 MED FILL — ATORVASTATIN CALCIUM 20 MG: 20 | 30 days supply | Qty: 30 | Fill #2

## 2019-12-17 MED FILL — HYDROCHLOROTHIAZIDE 25 MG T: 25 | 30 days supply | Qty: 30 | Fill #0

## 2019-12-19 MED FILL — CELECOXIB 200 MG CAP: 200 | 15 days supply | Qty: 30 | Fill #1

## 2019-12-26 MED FILL — CELECOXIB 200 MG CAP: 200 | 15 days supply | Qty: 30 | Fill #1

## 2020-01-12 ENCOUNTER — Other Ambulatory Visit: Payer: Self-pay | Admitting: Nurse Practitioner

## 2020-01-12 ENCOUNTER — Other Ambulatory Visit: Payer: Self-pay

## 2020-01-12 ENCOUNTER — Encounter: Payer: Self-pay | Admitting: Nurse Practitioner

## 2020-01-12 ENCOUNTER — Ambulatory Visit: Payer: 59 | Attending: Nurse Practitioner | Admitting: Nurse Practitioner

## 2020-01-12 VITALS — BP 144/89 | HR 72 | Temp 97.1°F | Ht 64.0 in | Wt 163.0 lb

## 2020-01-12 DIAGNOSIS — R7303 Prediabetes: Secondary | ICD-10-CM

## 2020-01-12 DIAGNOSIS — E782 Mixed hyperlipidemia: Secondary | ICD-10-CM | POA: Diagnosis not present

## 2020-01-12 DIAGNOSIS — I10 Essential (primary) hypertension: Secondary | ICD-10-CM | POA: Diagnosis not present

## 2020-01-12 LAB — GLUCOSE, POCT (MANUAL RESULT ENTRY): POC Glucose: 104 mg/dl — AB (ref 70–99)

## 2020-01-12 MED ORDER — HYDROCHLOROTHIAZIDE 25 MG PO TABS: ORAL_TABLET | ORAL | 0 refills | Status: DC

## 2020-01-12 MED ORDER — HYDROCHLOROTHIAZIDE 25 MG PO TABS: 25.0000 mg | ORAL_TABLET | Freq: Every day | ORAL | 0 refills | Status: DC

## 2020-01-12 MED ORDER — ATORVASTATIN CALCIUM 20 MG PO TABS: 20.0000 mg | ORAL_TABLET | Freq: Every day | ORAL | 0 refills | Status: DC

## 2020-01-12 MED ORDER — ATORVASTATIN CALCIUM 20 MG PO TABS: ORAL_TABLET | ORAL | 1 refills | Status: DC

## 2020-01-12 MED FILL — ATORVASTATIN CALCIUM 20 MG: 20 | 31 days supply | Qty: 31 | Fill #0

## 2020-01-12 MED FILL — HYDROCHLOROTHIAZIDE 25 MG T: 25 | 31 days supply | Qty: 31 | Fill #0

## 2020-01-12 MED FILL — CELECOXIB 200 MG CAP: 200 | 30 days supply | Qty: 60 | Fill #2

## 2020-01-12 NOTE — Telephone Encounter (Signed)
Requested Prescriptions  Pending Prescriptions Disp Refills  . hydrochlorothiazide (HYDRODIURIL) 25 MG tablet [Pharmacy Med Name: HYDROCHLOROTHIAZIDE 25 MG T 25 Tablet] 90 tablet 0    Sig: TAKE 1 TABLET (25 MG TOTAL) BY MOUTH DAILY.     Cardiovascular: Diuretics - Thiazide Failed - 01/12/2020  3:58 PM      Failed - Last BP in normal range    BP Readings from Last 1 Encounters:  01/12/20 (!) 144/89         Passed - Ca in normal range and within 360 days    Calcium  Date Value Ref Range Status  10/08/2019 9.7 8.7 - 10.2 mg/dL Final         Passed - Cr in normal range and within 360 days    Creat  Date Value Ref Range Status  03/13/2016 0.97 0.50 - 1.05 mg/dL Final    Comment:      For patients > or = 54 years of age: The upper reference limit for Creatinine is approximately 13% higher for people identified as African-American.      Creatinine, Ser  Date Value Ref Range Status  10/08/2019 0.82 0.57 - 1.00 mg/dL Final   Creatinine, Urine  Date Value Ref Range Status  03/13/2016 202 20 - 320 mg/dL Final         Passed - K in normal range and within 360 days    Potassium  Date Value Ref Range Status  10/08/2019 4.7 3.5 - 5.2 mmol/L Final         Passed - Na in normal range and within 360 days    Sodium  Date Value Ref Range Status  10/08/2019 138 134 - 144 mmol/L Final         Passed - Valid encounter within last 6 months    Recent Outpatient Visits          Today Essential hypertension   [place], Vernia Buff, NP   3 months ago Bilateral carpal tunnel syndrome   [place], Vernia Buff, NP   4 months ago Bilateral carpal tunnel syndrome   [place], Vernia Buff, NP   8 months ago Encounter for annual physical exam   [place], Vernia Buff, NP   9 months ago Essential hypertension   [place], Vernia Buff, NP      Future Appointments            In 3 months Gildardo Pounds, NP [place]           . atorvastatin (LIPITOR) 20 MG tablet [Pharmacy Med Name: ATORVASTATIN CALCIUM 20 MG 20 Tablet] 90 tablet 0    Sig: TAKE 1 TABLET (20 MG TOTAL) BY MOUTH DAILY.     Cardiovascular:  Antilipid - Statins Failed - 01/12/2020  3:58 PM      Failed - LDL in normal range and within 360 days    LDL Chol Calc (NIH)  Date Value Ref Range Status  03/25/2019 74 0 - 99 mg/dL Final         Passed - Total Cholesterol in normal range and within 360 days    Cholesterol, Total  Date Value Ref Range Status  03/25/2019 161 100 - 199 mg/dL Final         Passed - HDL in normal range and  within 360 days    HDL  Date Value Ref Range Status  03/25/2019 69 >39 mg/dL Final         Passed - Triglycerides in normal range and within 360 days    Triglycerides  Date Value Ref Range Status  03/25/2019 100 0 - 149 mg/dL Final         Passed - Patient is not pregnant      Passed - Valid encounter within last 12 months    Recent Outpatient Visits          Today Essential hypertension   [place], Vernia Buff, NP   3 months ago Bilateral carpal tunnel syndrome   [place], Vernia Buff, NP   4 months ago Bilateral carpal tunnel syndrome   [place], Vernia Buff, NP   8 months ago Encounter for annual physical exam   [place], Vernia Buff, NP   9 months ago Essential hypertension   [place], Vernia Buff, NP      Future Appointments            In 3 months Gildardo Pounds, NP [place]

## 2020-01-12 NOTE — Progress Notes (Signed)
Assessment & Plan:  Sarah Cruz was seen today for follow-up.  Diagnoses and all orders for this visit:  Essential hypertension -     Discontinue: hydrochlorothiazide (HYDRODIURIL) 25 MG tablet; Take 1 tablet (25 mg total) by mouth daily. Please fill as a 90 day supply -     Basic metabolic panel -     hydrochlorothiazide (HYDRODIURIL) 25 MG tablet; TAKE 1 TABLET (25 MG TOTAL) BY MOUTH DAILY. Please fill as a 90 day supply Continue all antihypertensives as prescribed.  Remember to bring in your blood pressure log with you for your follow up appointment.  DASH/Mediterranean Diets are healthier choices for HTN.    Prediabetes -     Glucose (CBG)  Mixed hyperlipidemia -     Discontinue: atorvastatin (LIPITOR) 20 MG tablet; Take 1 tablet (20 mg total) by mouth daily. Please fill as a 90 day supply -     atorvastatin (LIPITOR) 20 MG tablet; TAKE 1 TABLET (20 MG TOTAL) BY MOUTH DAILY. Please fill as a 90 day supply INSTRUCTIONS: Work on a low fat, heart healthy diet and participate in regular aerobic exercise program by working out at least 150 minutes per week; 5 days a week-30 minutes per day. Avoid red meat/beef/steak,  fried foods. junk foods, sodas, sugary drinks, unhealthy snacking, alcohol and smoking.  Drink at least 80 oz of water per day and monitor your carbohydrate intake daily.    Patient has been counseled on age-appropriate routine health concerns for screening and prevention. These are reviewed and up-to-date. Referrals have been placed accordingly. Immunizations are up-to-date or declined.    Subjective:   Chief Complaint  Patient presents with   Follow-up    Pt. is here for 3 months F.U. on hypertension.    HPI Sarah Cruz 54 y.o. female presents to office today for follow up. She has a past medical history of COPD, Hyperlipidemia, Hypertension, and Sarcoidosis.  Essential Hypertension Blood pressure is slightly elevated today. She is currently taking HCTZ 25 mg  daily as prescribed. States she ate a pork chop prior to her office visit and this is likely contributing to her elevated blood pressure. Denies chest pain, shortness of breath, palpitations, lightheadedness, dizziness, headaches or BLE edema. Will not make any changes today. Will recheck at next visit. She is trying to quit smoking.  BP Readings from Last 3 Encounters:  01/12/20 (!) 144/89  10/08/19 118/75  04/22/19 117/80    Prediabetes Diet controlled.  Lab Results  Component Value Date   HGBA1C 6.0 (H) 10/08/2019    Review of Systems  Constitutional: Negative for fever, malaise/fatigue and weight loss.  HENT: Negative.  Negative for nosebleeds.   Eyes: Negative.  Negative for blurred vision, double vision and photophobia.  Respiratory: Negative.  Negative for cough and shortness of breath.   Cardiovascular: Negative.  Negative for chest pain, palpitations and leg swelling.  Gastrointestinal: Negative.  Negative for heartburn, nausea and vomiting.  Musculoskeletal: Negative.  Negative for myalgias.  Neurological: Negative.  Negative for dizziness, focal weakness, seizures and headaches.  Psychiatric/Behavioral: Negative.  Negative for suicidal ideas.    Past Medical History:  Diagnosis Date   Allergy    Blood transfusion without reported diagnosis    Childhood asthma    COPD (chronic obstructive pulmonary disease) ([place])    Hyperlipidemia    Hypertension    Sarcoidosis     Past Surgical History:  Procedure Laterality Date   ABDOMINAL HYSTERECTOMY     ENDOBRONCHIAL ULTRASOUND  Bilateral 12/07/2015   Procedure: ENDOBRONCHIAL ULTRASOUND;  Surgeon: Javier Glazier, MD;  Location: [facility];  Service: Cardiopulmonary;  Laterality: Bilateral;    Family History  Problem Relation Age of Onset   Heart failure Mother    Diabetes Mother    Hypertension Mother    Arthritis Mother    Brain cancer Sister    Sickle cell anemia Other    Brain cancer Other      Asthma Other    Rheumatologic disease Neg Hx    Colon cancer Neg Hx    Esophageal cancer Neg Hx    Stomach cancer Neg Hx    Rectal cancer Neg Hx     Social History Reviewed with no changes to be made today.   Outpatient Medications Prior to Visit  Medication Sig Dispense Refill   acetaminophen (TYLENOL) 500 MG tablet Take 1,000 mg by mouth 2 (two) times daily as needed for mild pain or headache.     albuterol (VENTOLIN HFA) 108 (90 Base) MCG/ACT inhaler Inhale 2 puffs into the lungs every 6 (six) hours as needed for wheezing or shortness of breath. 18 g 1   celecoxib (CELEBREX) 200 MG capsule Take 1 capsule (200 mg total) by mouth 2 (two) times daily. 30 capsule 3   ibuprofen (ADVIL) 600 MG tablet Take 1 tablet (600 mg total) by mouth every 8 (eight) hours as needed. 60 tablet 1   nicotine (NICODERM CQ - DOSED IN MG/24 HR) 7 mg/24hr patch Place 1 patch (7 mg total) onto the skin daily. 28 patch 3   SYMBICORT 80-4.5 MCG/ACT inhaler INHALE 2 PUFFS INTO THE LUNGS TWICE DAILY. 10.2 g 3   atorvastatin (LIPITOR) 20 MG tablet Take 1 tablet (20 mg total) by mouth daily. Please fill as a 90 day supply 90 tablet 1   hydrochlorothiazide (HYDRODIURIL) 25 MG tablet Take 1 tablet (25 mg total) by mouth daily. Please fill as a 90 day supply 90 tablet 1   phentermine (ADIPEX-P) 37.5 MG tablet Take 1 tablet (37.5 mg total) by mouth daily before breakfast. (Patient not taking: Reported on 01/12/2020) 30 tablet 2   No facility-administered medications prior to visit.    No Known Allergies     Objective:    BP (!) 144/89 (BP Location: Left Arm, Patient Position: Sitting, Cuff Size: Normal)    Pulse 72    Temp (!) 97.1 F (36.2 C) (Temporal)    Ht 5\' 4"  (1.626 m)    Wt 163 lb (73.9 kg)    SpO2 98%    BMI 27.98 kg/m  Wt Readings from Last 3 Encounters:  01/12/20 163 lb (73.9 kg)  10/08/19 162 lb (73.5 kg)  04/22/19 161 lb (73 kg)    Physical Exam Vitals and nursing note reviewed.   Constitutional:      Appearance: She is well-developed.  HENT:     Head: Normocephalic and atraumatic.  Cardiovascular:     Rate and Rhythm: Normal rate and regular rhythm.     Heart sounds: Normal heart sounds. No murmur heard.  No friction rub. No gallop.   Pulmonary:     Effort: Pulmonary effort is normal. No tachypnea or respiratory distress.     Breath sounds: Normal breath sounds. No decreased breath sounds, wheezing, rhonchi or rales.  Chest:     Chest wall: No tenderness.  Abdominal:     General: Bowel sounds are normal.     Palpations: Abdomen is soft.  Musculoskeletal:  General: Normal range of motion.     Cervical back: Normal range of motion.  Skin:    General: Skin is warm and dry.  Neurological:     Mental Status: She is alert and oriented to person, place, and time.     Coordination: Coordination normal.  Psychiatric:        Behavior: Behavior normal. Behavior is cooperative.        Thought Content: Thought content normal.        Judgment: Judgment normal.          Patient has been counseled extensively about nutrition and exercise as well as the importance of adherence with medications and regular follow-up. The patient was given clear instructions to go to ER or return to medical center if symptoms don't improve, worsen or new problems develop. The patient verbalized understanding.   Follow-up: Return in about 3 months (around 04/12/2020).   Gildardo Pounds, FNP-BC [facility] and [facility], [place]   01/12/2020, 8:34 PM

## 2020-01-13 LAB — BASIC METABOLIC PANEL
BUN/Creatinine Ratio: 23 (ref 9–23)
BUN: 25 mg/dL — ABNORMAL HIGH (ref 6–24)
CO2: 22 mmol/L (ref 20–29)
Calcium: 10.4 mg/dL — ABNORMAL HIGH (ref 8.7–10.2)
Chloride: 104 mmol/L (ref 96–106)
Creatinine, Ser: 1.09 mg/dL — ABNORMAL HIGH (ref 0.57–1.00)
GFR calc Af Amer: 67 mL/min/{1.73_m2} (ref >59)
GFR calc non Af Amer: 58 mL/min/{1.73_m2} — ABNORMAL LOW (ref >59)
Glucose: 94 mg/dL (ref 65–99)
Potassium: 4.7 mmol/L (ref 3.5–5.2)
Sodium: 145 mmol/L — ABNORMAL HIGH (ref 134–144)

## 2020-02-04 MED FILL — SYMBICORT 80-4.5 MCG INH: 80-4.5 | 30 days supply | Qty: 10 | Fill #3

## 2020-02-23 ENCOUNTER — Other Ambulatory Visit: Payer: Self-pay | Admitting: Physician Assistant

## 2020-02-23 ENCOUNTER — Other Ambulatory Visit: Payer: Self-pay | Admitting: Orthopaedic Surgery

## 2020-02-23 MED FILL — CELECOXIB 200 MG CAP: 200 | 30 days supply | Qty: 60 | Fill #0

## 2020-02-23 MED FILL — HYDROCHLOROTHIAZIDE 25 MG T: 25 | 31 days supply | Qty: 31 | Fill #1

## 2020-02-23 MED FILL — ATORVASTATIN CALCIUM 20 MG: 20 | 31 days supply | Qty: 31 | Fill #1

## 2020-03-24 MED FILL — HYDROCHLOROTHIAZIDE 25 MG T: 25 | 28 days supply | Qty: 28 | Fill #2

## 2020-03-24 MED FILL — ATORVASTATIN CALCIUM 20 MG: 20 | 28 days supply | Qty: 28 | Fill #2

## 2020-03-25 MED FILL — CELECOXIB 200 MG CAP: 200 | 15 days supply | Qty: 30 | Fill #1

## 2020-04-12 ENCOUNTER — Other Ambulatory Visit: Payer: Self-pay

## 2020-04-12 ENCOUNTER — Ambulatory Visit: Payer: 59 | Attending: Nurse Practitioner | Admitting: Nurse Practitioner

## 2020-04-12 ENCOUNTER — Encounter: Payer: Self-pay | Admitting: Nurse Practitioner

## 2020-04-12 VITALS — BP 124/79 | HR 67 | Ht 64.5 in | Wt 159.6 lb

## 2020-04-12 DIAGNOSIS — R7303 Prediabetes: Secondary | ICD-10-CM | POA: Diagnosis not present

## 2020-04-12 DIAGNOSIS — Z1231 Encounter for screening mammogram for malignant neoplasm of breast: Secondary | ICD-10-CM

## 2020-04-12 DIAGNOSIS — J449 Chronic obstructive pulmonary disease, unspecified: Secondary | ICD-10-CM

## 2020-04-12 DIAGNOSIS — E782 Mixed hyperlipidemia: Secondary | ICD-10-CM

## 2020-04-12 DIAGNOSIS — I739 Peripheral vascular disease, unspecified: Secondary | ICD-10-CM

## 2020-04-12 DIAGNOSIS — I10 Essential (primary) hypertension: Secondary | ICD-10-CM

## 2020-04-12 DIAGNOSIS — R7989 Other specified abnormal findings of blood chemistry: Secondary | ICD-10-CM

## 2020-04-12 NOTE — Progress Notes (Signed)
Assessment & Plan:  Sarah Cruz was seen today for hypertension.  Diagnoses and all orders for this visit:  Essential hypertension -     CMP14+EGFR Continue all antihypertensives as prescribed.  Remember to bring in your blood pressure log with you for your follow up appointment.  DASH/Mediterranean Diets are healthier choices for HTN.   COPD mixed type ([place]) Continue inhalers as prescribed  Prediabetes -     Hemoglobin A1c  Mixed hyperlipidemia -     Lipid panel INSTRUCTIONS: Work on a low fat, heart healthy diet and participate in regular aerobic exercise program by working out at least 150 minutes per week; 5 days a week-30 minutes per day. Avoid red meat/beef/steak,  fried foods. junk foods, sodas, sugary drinks, unhealthy snacking, alcohol and smoking.  Drink at least 80 oz of water per day and monitor your carbohydrate intake daily.    PAD (peripheral artery disease) (HCC) Continues smoking. Trying to quit.   Abnormal CBC -     CBC  Breast cancer screening by mammogram -     MM 3D SCREEN BREAST BILATERAL; Future    Patient has been counseled on age-appropriate routine health concerns for screening and prevention. These are reviewed and up-to-date. Referrals have been placed accordingly. Immunizations are up-to-date or declined.    Subjective:   Chief Complaint  Patient presents with  . Hypertension   HPI Sarah Cruz 55 y.o. female presents to office today for follow up. PMH significant for HTN  Essential Hypertension Blood pressure is well controlled. He is taking HCTZ 25 mg daily as prescribed. Denies chest pain, shortness of breath, palpitations, lightheadedness, dizziness, headaches or BLE edema.  BP Readings from Last 3 Encounters:  04/12/20 124/79  01/12/20 (!) 144/89  10/08/19 118/75   COPD Symptoms well controlled with symbicort daily and albuterol prn.  Prediabetes Well controlled. She is currently not taking any oral diabetic medications.   Lab Results  Component Value Date   HGBA1C 6.0 (H) 10/08/2019   Dyslipidemia  Cholesterol levels well controlled with atorvastatin 20 mg daily.  Lab Results  Component Value Date   LDLCALC 74 03/25/2019   Review of Systems  Constitutional: Negative for fever, malaise/fatigue and weight loss.  HENT: Negative.  Negative for nosebleeds.   Eyes: Negative.  Negative for blurred vision, double vision and photophobia.  Respiratory: Negative.  Negative for cough and shortness of breath.   Cardiovascular: Negative.  Negative for chest pain, palpitations and leg swelling.  Gastrointestinal: Negative.  Negative for heartburn, nausea and vomiting.  Musculoskeletal: Negative.  Negative for myalgias.  Neurological: Negative.  Negative for dizziness, focal weakness, seizures and headaches.  Psychiatric/Behavioral: Negative.  Negative for suicidal ideas.    Past Medical History:  Diagnosis Date  . Allergy   . Blood transfusion without reported diagnosis   . Childhood asthma   . COPD (chronic obstructive pulmonary disease) ([place])   . Hyperlipidemia   . Hypertension   . Sarcoidosis     Past Surgical History:  Procedure Laterality Date  . ABDOMINAL HYSTERECTOMY    . ENDOBRONCHIAL ULTRASOUND Bilateral 12/07/2015   Procedure: ENDOBRONCHIAL ULTRASOUND;  Surgeon: Javier Glazier, MD;  Location: [facility];  Service: Cardiopulmonary;  Laterality: Bilateral;    Family History  Problem Relation Age of Onset  . Heart failure Mother   . Diabetes Mother   . Hypertension Mother   . Arthritis Mother   . Brain cancer Sister   . Sickle cell anemia Other   . Brain  cancer Other   . Asthma Other   . Rheumatologic disease Neg Hx   . Colon cancer Neg Hx   . Esophageal cancer Neg Hx   . Stomach cancer Neg Hx   . Rectal cancer Neg Hx     Social History Reviewed with no changes to be made today.   Outpatient Medications Prior to Visit  Medication Sig Dispense Refill  . acetaminophen  (TYLENOL) 500 MG tablet Take 1,000 mg by mouth 2 (two) times daily as needed for mild pain or headache.    . albuterol (VENTOLIN HFA) 108 (90 Base) MCG/ACT inhaler Inhale 2 puffs into the lungs every 6 (six) hours as needed for wheezing or shortness of breath. 18 g 1  . atorvastatin (LIPITOR) 20 MG tablet TAKE 1 TABLET (20 MG TOTAL) BY MOUTH DAILY. Please fill as a 90 day supply 90 tablet 1  . celecoxib (CELEBREX) 200 MG capsule TAKE 1 CAPSULE (200 MG TOTAL) BY MOUTH 2 (TWO) TIMES DAILY. 60 capsule 3  . hydrochlorothiazide (HYDRODIURIL) 25 MG tablet TAKE 1 TABLET (25 MG TOTAL) BY MOUTH DAILY. Please fill as a 90 day supply 90 tablet 0  . ibuprofen (ADVIL) 600 MG tablet Take 1 tablet (600 mg total) by mouth every 8 (eight) hours as needed. 60 tablet 1  . nicotine (NICODERM CQ - DOSED IN MG/24 HR) 7 mg/24hr patch Place 1 patch (7 mg total) onto the skin daily. 28 patch 3  . SYMBICORT 80-4.5 MCG/ACT inhaler INHALE 2 PUFFS INTO THE LUNGS TWICE DAILY. 10.2 g 3  . phentermine (ADIPEX-P) 37.5 MG tablet Take 1 tablet (37.5 mg total) by mouth daily before breakfast. (Patient not taking: No sig reported) 30 tablet 2   No facility-administered medications prior to visit.    No Known Allergies     Objective:    BP 124/79   Pulse 67   Ht 5' 4.5" (1.638 m)   Wt 159 lb 9.6 oz (72.4 kg)   SpO2 99%   BMI 26.97 kg/m  Wt Readings from Last 3 Encounters:  04/12/20 159 lb 9.6 oz (72.4 kg)  01/12/20 163 lb (73.9 kg)  10/08/19 162 lb (73.5 kg)    Physical Exam Vitals and nursing note reviewed.  Constitutional:      Appearance: She is well-developed and well-nourished.  HENT:     Head: Normocephalic and atraumatic.  Eyes:     Extraocular Movements: EOM normal.  Cardiovascular:     Rate and Rhythm: Normal rate and regular rhythm.     Pulses: Intact distal pulses.     Heart sounds: Normal heart sounds. No murmur heard. No friction rub. No gallop.   Pulmonary:     Effort: Pulmonary effort is  normal. No tachypnea or respiratory distress.     Breath sounds: Normal breath sounds. No decreased breath sounds, wheezing, rhonchi or rales.  Chest:     Chest wall: No tenderness.  Abdominal:     General: Bowel sounds are normal.     Palpations: Abdomen is soft.  Musculoskeletal:        General: No edema. Normal range of motion.     Cervical back: Normal range of motion.  Skin:    General: Skin is warm and dry.  Neurological:     Mental Status: She is alert and oriented to person, place, and time.     Coordination: Coordination normal.  Psychiatric:        Mood and Affect: Mood and affect normal.  Behavior: Behavior normal. Behavior is cooperative.        Thought Content: Thought content normal.        Judgment: Judgment normal.          Patient has been counseled extensively about nutrition and exercise as well as the importance of adherence with medications and regular follow-up. The patient was given clear instructions to go to ER or return to medical center if symptoms don't improve, worsen or new problems develop. The patient verbalized understanding.   Follow-up: Return in about 3 months (around 07/10/2020) for in OFFICE .   Gildardo Pounds, FNP-BC [facility] and [facility] [place], [place]   04/12/2020, 8:02 PM

## 2020-04-13 LAB — CBC
Hematocrit: 37.8 % (ref 34.0–46.6)
Hemoglobin: 11.6 g/dL (ref 11.1–15.9)
MCH: 22.8 pg — ABNORMAL LOW (ref 26.6–33.0)
MCHC: 30.7 g/dL — ABNORMAL LOW (ref 31.5–35.7)
MCV: 74 fL — ABNORMAL LOW (ref 79–97)
Platelets: 327 10*3/uL (ref 150–450)
RBC: 5.09 x10E6/uL (ref 3.77–5.28)
RDW: 14.4 % (ref 11.7–15.4)
WBC: 6.4 10*3/uL (ref 3.4–10.8)

## 2020-04-13 LAB — LIPID PANEL
Chol/HDL Ratio: 2.3 ratio (ref 0.0–4.4)
Cholesterol, Total: 170 mg/dL (ref 100–199)
HDL: 75 mg/dL (ref >39)
LDL Chol Calc (NIH): 81 mg/dL (ref 0–99)
Triglycerides: 77 mg/dL (ref 0–149)
VLDL Cholesterol Cal: 14 mg/dL (ref 5–40)

## 2020-04-13 LAB — CMP14+EGFR
ALT: 27 IU/L (ref 0–32)
AST: 23 IU/L (ref 0–40)
Albumin/Globulin Ratio: 1.8 (ref 1.2–2.2)
Albumin: 4.4 g/dL (ref 3.8–4.9)
Alkaline Phosphatase: 70 IU/L (ref 44–121)
BUN/Creatinine Ratio: 20 (ref 9–23)
BUN: 18 mg/dL (ref 6–24)
Bilirubin Total: <0.2 mg/dL (ref 0.0–1.2)
CO2: 24 mmol/L (ref 20–29)
Calcium: 9.1 mg/dL (ref 8.7–10.2)
Chloride: 99 mmol/L (ref 96–106)
Creatinine, Ser: 0.92 mg/dL (ref 0.57–1.00)
Globulin, Total: 2.5 g/dL (ref 1.5–4.5)
Glucose: 93 mg/dL (ref 65–99)
Potassium: 4.7 mmol/L (ref 3.5–5.2)
Sodium: 139 mmol/L (ref 134–144)
Total Protein: 6.9 g/dL (ref 6.0–8.5)
eGFR: 74 mL/min/{1.73_m2} (ref >59)

## 2020-04-13 LAB — HEMOGLOBIN A1C
Est. average glucose Bld gHb Est-mCnc: 123 mg/dL
Hgb A1c MFr Bld: 5.9 % — ABNORMAL HIGH (ref 4.8–5.6)

## 2020-04-15 ENCOUNTER — Other Ambulatory Visit: Payer: Self-pay | Admitting: Internal Medicine

## 2020-04-15 MED FILL — CELECOXIB 200 MG CAP: 200 | 15 days supply | Qty: 30 | Fill #2

## 2020-04-23 ENCOUNTER — Other Ambulatory Visit: Payer: Self-pay | Admitting: Nurse Practitioner

## 2020-04-23 ENCOUNTER — Other Ambulatory Visit: Payer: Self-pay | Admitting: Internal Medicine

## 2020-04-23 DIAGNOSIS — I10 Essential (primary) hypertension: Secondary | ICD-10-CM

## 2020-04-23 DIAGNOSIS — E782 Mixed hyperlipidemia: Secondary | ICD-10-CM

## 2020-04-23 NOTE — Telephone Encounter (Signed)
Future visit in 2 months  

## 2020-05-05 MED FILL — CELECOXIB 200 MG CAP: 200 | 15 days supply | Qty: 30 | Fill #3

## 2020-05-25 ENCOUNTER — Other Ambulatory Visit: Payer: Self-pay

## 2020-05-25 MED FILL — Atorvastatin Calcium Tab 20 MG (Base Equivalent): ORAL | 30 days supply | Qty: 30 | Fill #0 | Status: CN

## 2020-05-25 MED FILL — Hydrochlorothiazide Tab 25 MG: ORAL | 30 days supply | Qty: 30 | Fill #0 | Status: CN

## 2020-05-25 MED FILL — Celecoxib Cap 200 MG: ORAL | 30 days supply | Qty: 60 | Fill #0 | Status: CN

## 2020-05-27 ENCOUNTER — Other Ambulatory Visit: Payer: Self-pay

## 2020-06-01 ENCOUNTER — Other Ambulatory Visit: Payer: Self-pay

## 2020-06-01 MED FILL — Celecoxib Cap 200 MG: ORAL | 30 days supply | Qty: 60 | Fill #0 | Status: AC

## 2020-06-01 MED FILL — Hydrochlorothiazide Tab 25 MG: ORAL | 30 days supply | Qty: 30 | Fill #0 | Status: AC

## 2020-06-01 MED FILL — Atorvastatin Calcium Tab 20 MG (Base Equivalent): ORAL | 30 days supply | Qty: 30 | Fill #0 | Status: AC

## 2020-06-25 ENCOUNTER — Other Ambulatory Visit: Payer: Self-pay | Admitting: Nurse Practitioner

## 2020-06-25 ENCOUNTER — Other Ambulatory Visit: Payer: Self-pay

## 2020-06-25 MED ORDER — BUDESONIDE-FORMOTEROL FUMARATE 80-4.5 MCG/ACT IN AERO
2.0000 | INHALATION_SPRAY | Freq: Two times a day (BID) | RESPIRATORY_TRACT | 0 refills | Status: DC
  Filled 2020-06-25: qty 10.2, 30d supply, fill #0

## 2020-06-25 NOTE — Telephone Encounter (Signed)
  Notes to clinic:  medication filled by different provider  Review for refill    Requested Prescriptions  Pending Prescriptions Disp Refills   budesonide-formoterol (SYMBICORT) 80-4.5 MCG/ACT inhaler 10.2 g 3    Sig: INHALE 2 PUFFS INTO THE LUNGS TWICE DAILY.      Pulmonology:  Combination Products Passed - 06/25/2020 12:02 PM      Passed - Valid encounter within last 12 months    Recent Outpatient Visits           2 months ago Essential hypertension   [place], Vernia Buff, NP   5 months ago Essential hypertension   [place] Gildardo Pounds, NP   8 months ago Bilateral carpal tunnel syndrome   [place] Gildardo Pounds, NP   9 months ago Bilateral carpal tunnel syndrome   [place], Vernia Buff, NP   1 year ago Encounter for annual physical exam   [place], Vernia Buff, NP       Future Appointments             In 2 weeks Gildardo Pounds, NP [place]

## 2020-06-28 ENCOUNTER — Other Ambulatory Visit: Payer: Self-pay

## 2020-06-30 ENCOUNTER — Other Ambulatory Visit: Payer: Self-pay

## 2020-06-30 MED FILL — Hydrochlorothiazide Tab 25 MG: ORAL | 30 days supply | Qty: 30 | Fill #1 | Status: CN

## 2020-06-30 MED FILL — Atorvastatin Calcium Tab 20 MG (Base Equivalent): ORAL | 29 days supply | Qty: 29 | Fill #1 | Status: AC

## 2020-06-30 MED FILL — Hydrochlorothiazide Tab 25 MG: ORAL | 29 days supply | Qty: 29 | Fill #1 | Status: AC

## 2020-06-30 MED FILL — Atorvastatin Calcium Tab 20 MG (Base Equivalent): ORAL | 30 days supply | Qty: 30 | Fill #1 | Status: CN

## 2020-07-09 ENCOUNTER — Telehealth: Payer: Self-pay | Admitting: Nurse Practitioner

## 2020-07-09 NOTE — Telephone Encounter (Signed)
Patient has appt with Zelda on 6/1 but provider is working virtual. Called patient and left vm that appt will be virtual but if in person is preferred to call [phone] to reschedule for another day.

## 2020-07-14 ENCOUNTER — Other Ambulatory Visit: Payer: Self-pay

## 2020-07-14 ENCOUNTER — Ambulatory Visit: Payer: 59 | Attending: Nurse Practitioner | Admitting: Nurse Practitioner

## 2020-07-14 ENCOUNTER — Encounter: Payer: Self-pay | Admitting: Nurse Practitioner

## 2020-07-14 DIAGNOSIS — E782 Mixed hyperlipidemia: Secondary | ICD-10-CM | POA: Diagnosis not present

## 2020-07-14 DIAGNOSIS — E663 Overweight: Secondary | ICD-10-CM | POA: Diagnosis not present

## 2020-07-14 DIAGNOSIS — I10 Essential (primary) hypertension: Secondary | ICD-10-CM | POA: Diagnosis not present

## 2020-07-14 DIAGNOSIS — J449 Chronic obstructive pulmonary disease, unspecified: Secondary | ICD-10-CM | POA: Diagnosis not present

## 2020-07-14 MED ORDER — BUDESONIDE-FORMOTEROL FUMARATE 80-4.5 MCG/ACT IN AERO
2.0000 | INHALATION_SPRAY | Freq: Two times a day (BID) | RESPIRATORY_TRACT | 3 refills | Status: DC
  Filled 2020-07-14: qty 10.2, 30d supply, fill #0

## 2020-07-14 MED ORDER — BUDESONIDE-FORMOTEROL FUMARATE 80-4.5 MCG/ACT IN AERO: 2.0000 | INHALATION_SPRAY | Freq: Two times a day (BID) | RESPIRATORY_TRACT | 3 refills | Status: DC

## 2020-07-14 MED ORDER — HYDROCHLOROTHIAZIDE 25 MG PO TABS: ORAL_TABLET | Freq: Every day | ORAL | 0 refills | Status: DC

## 2020-07-14 MED ORDER — ATORVASTATIN CALCIUM 20 MG PO TABS
ORAL_TABLET | ORAL | 1 refills | Status: DC
  Filled 2020-07-14: qty 90, fill #0
  Filled 2020-07-28: qty 30, 30d supply, fill #0
  Filled 2020-08-20 – 2020-08-25 (×2): qty 30, 30d supply, fill #1
  Filled 2020-09-23: qty 30, 30d supply, fill #2

## 2020-07-14 MED ORDER — HYDROCHLOROTHIAZIDE 25 MG PO TABS
ORAL_TABLET | Freq: Every day | ORAL | 0 refills | Status: DC
  Filled 2020-07-14: qty 90, fill #0
  Filled 2020-07-28 – 2020-08-02 (×5): qty 30, 30d supply, fill #0
  Filled 2020-08-20 – 2020-08-25 (×2): qty 30, 30d supply, fill #1
  Filled 2020-09-23: qty 30, 30d supply, fill #2

## 2020-07-14 MED ORDER — ALBUTEROL SULFATE HFA 108 (90 BASE) MCG/ACT IN AERS
2.0000 | INHALATION_SPRAY | Freq: Four times a day (QID) | RESPIRATORY_TRACT | 1 refills | Status: DC | PRN
Start: 2020-07-14 — End: 2020-10-12
  Filled 2020-07-14: qty 8.5, 25d supply, fill #0

## 2020-07-14 MED ORDER — PHENTERMINE HCL 37.5 MG PO TABS: 37.5000 mg | ORAL_TABLET | Freq: Every day | ORAL | 1 refills | Status: DC

## 2020-07-14 MED FILL — Celecoxib Cap 200 MG: ORAL | 15 days supply | Qty: 30 | Fill #1 | Status: AC

## 2020-07-14 MED FILL — Celecoxib Cap 200 MG: ORAL | 15 days supply | Qty: 30 | Fill #1 | Status: CN

## 2020-07-14 NOTE — Progress Notes (Signed)
Virtual Visit via Telephone Note Due to national recommendations of social distancing due to [place] 19, telehealth visit is felt to be most appropriate for this patient at this time.  I discussed the limitations, risks, security and privacy concerns of performing an evaluation and management service by telephone and the availability of in person appointments. I also discussed with the patient that there may be a patient responsible charge related to this service. The patient expressed understanding and agreed to proceed.    I connected with Sarah Cruz on 07/14/20  at   3:30 PM EDT  EDT by telephone and verified that I am speaking with the correct person using two identifiers.   Consent I discussed the limitations, risks, security and privacy concerns of performing an evaluation and management service by telephone and the availability of in person appointments. I also discussed with the patient that there may be a patient responsible charge related to this service. The patient expressed understanding and agreed to proceed.   Location of Patient: Private Residence   Location of Provider: [place] and [place] participating in Telemedicine visit: Geryl Rankins FNP-BC [place]    History of Present Illness: Telemedicine visit for: Follow up  Essential Hypertension She does not monitor her blood pressure at home as she can not afford to pay for a blood pressure device. Blood pressure is controlled. She endorses adherence taking HCTZ 25 mg daily as prescribed. Denies chest pain, shortness of breath, palpitations, lightheadedness, dizziness, headaches or BLE edema.  BP Readings from Last 3 Encounters:  04/12/20 124/79  01/12/20 (!) 144/89  10/08/19 118/75    COPD  Well controlled. Denies increased cough, shortness of breath and wheezing.     Past Medical History:  Diagnosis Date  . Allergy   . Blood transfusion without  reported diagnosis   . Childhood asthma   . COPD (chronic obstructive pulmonary disease) ([place])   . Hyperlipidemia   . Hypertension   . Sarcoidosis     Past Surgical History:  Procedure Laterality Date  . ABDOMINAL HYSTERECTOMY    . ENDOBRONCHIAL ULTRASOUND Bilateral 12/07/2015   Procedure: ENDOBRONCHIAL ULTRASOUND;  Surgeon: Javier Glazier, MD;  [facility]: [facility];  Service: Cardiopulmonary;  Laterality: Bilateral;    Family History  Problem Relation Age of Onset  . Heart failure Mother   . Diabetes Mother   . Hypertension Mother   . Arthritis Mother   . Brain cancer Sister   . Sickle cell anemia Other   . Brain cancer Other   . Asthma Other   . Rheumatologic disease Neg Hx   . Colon cancer Neg Hx   . Esophageal cancer Neg Hx   . Stomach cancer Neg Hx   . Rectal cancer Neg Hx     Social History   Socioeconomic History  . Marital status: Divorced    Spouse name: Not on file  . Number of children: 3  . Years of education: Not on file  . Highest education level: Not on file  Occupational History  . Not on file  Tobacco Use  . Smoking status: Current Every Day Smoker    Packs/day: 0.50    Years: 38.00    Pack years: 19.00    Types: Cigarettes    Start date: 04/08/1977  . Smokeless tobacco: Never Used  . Tobacco comment: down to 0.5ppd  Vaping Use  . Vaping Use: Never used  Substance and Sexual Activity  .  Alcohol use: No    Comment: Remote EtOH  . 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 Sexual activity: Not Currently  Other Topics Concern  . Not on file  Social History Narrative   Originally from [place], [place]. Moved to [place] in November 2016. Prior travel to [place]. No travel outside the [place]. 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 No bird, mold, or hot tub exposure. No history of volunteer work or residence in a homeless shelter. No history of incarceration. Does have prior exposure to a nephew with TB but has  always had a negative PPD skin test.    Social Determinants of Health   Financial Resource Strain: Not on file  Food Insecurity: Not on file  Transportation Needs: Not on file  Physical Activity: Not on file  Stress: Not on file  Social Connections: Not on file     Observations/Objective: Awake, alert and oriented x 3   Review of Systems  Constitutional: Negative for fever, malaise/fatigue and weight loss.  HENT: Negative.  Negative for nosebleeds.   Eyes: Negative.  Negative for blurred vision, double vision and photophobia.  Respiratory: Negative.  Negative for cough and shortness of breath.   Cardiovascular: Negative.  Negative for chest pain, palpitations and leg swelling.  Gastrointestinal: Negative.  Negative for heartburn, nausea and vomiting.  Musculoskeletal: Negative.  Negative for myalgias.  Neurological: Negative.  Negative for dizziness, focal weakness, seizures and headaches.  Psychiatric/Behavioral: Negative.  Negative for suicidal ideas.    Assessment and Plan: Diagnoses and all orders for this visit:  Essential hypertension -     hydrochlorothiazide (HYDRODIURIL) 25 MG tablet; TAKE 1 TABLET (25 MG TOTAL) BY MOUTH DAILY. Continue all antihypertensives as prescribed.  Remember to bring in your blood pressure log with you for your follow up appointment.  DASH/Mediterranean Diets are healthier choices for HTN.    Mixed hyperlipidemia -     atorvastatin (LIPITOR) 20 MG tablet; TAKE 1 TABLET (20 MG TOTAL) BY MOUTH DAILY. INSTRUCTIONS: Work on a low fat, heart healthy diet and participate in regular aerobic exercise program by working out at least 150 minutes per week; 5 days a week-30 minutes per day. Avoid red meat/beef/steak,  fried foods. junk foods, sodas, sugary drinks, unhealthy snacking, alcohol and smoking.  Drink at least 80 oz of water per day and monitor your carbohydrate intake daily.    Overweight (BMI 25.0-29.9) -     phentermine (ADIPEX-P) 37.5 MG  tablet; Take 1 tablet (37.5 mg total) by mouth daily before breakfast. Discussed diet and exercise for person with BMI >25. Instructed: You must burn more calories than you eat. Losing 5 percent of your body weight should be considered a success. In the longer term, losing more than 15 percent of your body weight and staying at this weight is an extremely good result. However, keep in mind that even losing 5 percent of your body weight leads to important health benefits, so try not to get discouraged if you're not able to lose more than this. Will recheck weight in 3-6 months.  COPD GOLD II/ still smoking  -     budesonide-formoterol (SYMBICORT) 80-4.5 MCG/ACT inhaler; INHALE 2 PUFFS INTO THE LUNGS TWICE DAILY. -     albuterol (VENTOLIN HFA) 108 (90 Base) MCG/ACT inhaler; Inhale 2 puffs into the lungs every 6 (six) hours as needed for wheezing or shortness of breath.    Follow Up  Instructions Return in about 3 months (around 10/14/2020).     I discussed the assessment and treatment plan with the patient. The patient was provided an opportunity to ask questions and all were answered. The patient agreed with the plan and demonstrated an understanding of the instructions.   The patient was advised to call back or seek an in-person evaluation if the symptoms worsen or if the condition fails to improve as anticipated.  I provided 11 minutes of non-face-to-face time during this encounter including median intraservice time, reviewing previous notes, labs, imaging, medications and explaining diagnosis and management.  Gildardo Pounds, FNP-BC

## 2020-07-15 ENCOUNTER — Other Ambulatory Visit: Payer: Self-pay

## 2020-07-22 ENCOUNTER — Other Ambulatory Visit: Payer: Self-pay

## 2020-07-29 ENCOUNTER — Other Ambulatory Visit: Payer: Self-pay

## 2020-07-30 ENCOUNTER — Other Ambulatory Visit: Payer: Self-pay

## 2020-08-02 ENCOUNTER — Other Ambulatory Visit: Payer: Self-pay

## 2020-08-20 ENCOUNTER — Other Ambulatory Visit: Payer: Self-pay

## 2020-08-23 ENCOUNTER — Other Ambulatory Visit: Payer: Self-pay

## 2020-08-25 ENCOUNTER — Other Ambulatory Visit: Payer: Self-pay

## 2020-08-26 ENCOUNTER — Other Ambulatory Visit: Payer: Self-pay

## 2020-09-13 ENCOUNTER — Ambulatory Visit: Payer: 59 | Admitting: Nurse Practitioner

## 2020-09-21 ENCOUNTER — Encounter: Payer: Self-pay | Admitting: Nurse Practitioner

## 2020-09-23 ENCOUNTER — Other Ambulatory Visit: Payer: Self-pay

## 2020-09-24 ENCOUNTER — Other Ambulatory Visit: Payer: Self-pay

## 2020-10-12 ENCOUNTER — Other Ambulatory Visit: Payer: Self-pay

## 2020-10-12 ENCOUNTER — Encounter: Payer: Self-pay | Admitting: Nurse Practitioner

## 2020-10-12 ENCOUNTER — Ambulatory Visit: Payer: 59 | Attending: Nurse Practitioner | Admitting: Nurse Practitioner

## 2020-10-12 VITALS — BP 121/82 | HR 71 | Ht 64.5 in | Wt 155.0 lb

## 2020-10-12 DIAGNOSIS — R7303 Prediabetes: Secondary | ICD-10-CM | POA: Diagnosis not present

## 2020-10-12 DIAGNOSIS — E782 Mixed hyperlipidemia: Secondary | ICD-10-CM

## 2020-10-12 DIAGNOSIS — J449 Chronic obstructive pulmonary disease, unspecified: Secondary | ICD-10-CM

## 2020-10-12 DIAGNOSIS — G5603 Carpal tunnel syndrome, bilateral upper limbs: Secondary | ICD-10-CM

## 2020-10-12 DIAGNOSIS — I10 Essential (primary) hypertension: Secondary | ICD-10-CM

## 2020-10-12 DIAGNOSIS — E663 Overweight: Secondary | ICD-10-CM

## 2020-10-12 MED ORDER — CELECOXIB 200 MG PO CAPS
200.0000 mg | ORAL_CAPSULE | Freq: Every day | ORAL | 3 refills | Status: AC
  Filled 2020-10-12: qty 30, 30d supply, fill #0

## 2020-10-12 MED ORDER — HYDROCHLOROTHIAZIDE 25 MG PO TABS
25.0000 mg | ORAL_TABLET | Freq: Every day | ORAL | 0 refills | Status: DC
  Filled 2020-10-12: qty 90, 90d supply, fill #0
  Filled 2020-10-27: qty 30, 30d supply, fill #0
  Filled 2020-11-26: qty 30, 30d supply, fill #1
  Filled 2020-12-15: qty 30, 30d supply, fill #2

## 2020-10-12 MED ORDER — BUDESONIDE-FORMOTEROL FUMARATE 80-4.5 MCG/ACT IN AERO
2.0000 | INHALATION_SPRAY | Freq: Two times a day (BID) | RESPIRATORY_TRACT | 3 refills | Status: DC
  Filled 2020-10-12 – 2020-11-12 (×2): qty 10.2, 30d supply, fill #0
  Filled 2021-02-04: qty 10.2, 30d supply, fill #1
  Filled 2021-03-22: qty 10.2, 30d supply, fill #0
  Filled 2021-04-25: qty 10.2, 30d supply, fill #1

## 2020-10-12 MED ORDER — PHENTERMINE HCL 37.5 MG PO TABS: 37.5000 mg | ORAL_TABLET | Freq: Every day | ORAL | 1 refills | Status: DC

## 2020-10-12 MED ORDER — ATORVASTATIN CALCIUM 20 MG PO TABS
ORAL_TABLET | ORAL | 1 refills | Status: DC
  Filled 2020-10-12: qty 90, fill #0
  Filled 2020-10-27: qty 30, 30d supply, fill #0
  Filled 2020-11-26: qty 30, 30d supply, fill #1
  Filled 2020-12-15: qty 30, 30d supply, fill #2

## 2020-10-12 MED ORDER — ALBUTEROL SULFATE HFA 108 (90 BASE) MCG/ACT IN AERS
2.0000 | INHALATION_SPRAY | Freq: Four times a day (QID) | RESPIRATORY_TRACT | 1 refills | Status: DC | PRN
  Filled 2020-10-12: qty 8.5, 25d supply, fill #0

## 2020-10-12 NOTE — Progress Notes (Signed)
Assessment & Plan:  Sarah Cruz was seen today for hypertension.  Diagnoses and all orders for this visit:  Essential hypertension -     hydrochlorothiazide (HYDRODIURIL) 25 MG tablet; Take 1 tablet (25 mg total) by mouth daily. -     CMP14+EGFR Continue all antihypertensives as prescribed.  Remember to bring in your blood pressure log with you for your follow up appointment.  DASH/Mediterranean Diets are healthier choices for HTN.    COPD GOLD II/ still smoking  -     budesonide-formoterol (SYMBICORT) 80-4.5 MCG/ACT inhaler; Inhale 2 puffs into the lungs 2 (two) times daily. -     albuterol (VENTOLIN HFA) 108 (90 Base) MCG/ACT inhaler; Inhale 2 puffs into the lungs every 6 (six) hours as needed.  Mixed hyperlipidemia -     atorvastatin (LIPITOR) 20 MG tablet; TAKE 1 TABLET (20 MG TOTAL) BY MOUTH DAILY. INSTRUCTIONS: Work on a low fat, heart healthy diet and participate in regular aerobic exercise program by working out at least 150 minutes per week; 5 days a week-30 minutes per day. Avoid red meat/beef/steak,  fried foods. junk foods, sodas, sugary drinks, unhealthy snacking, alcohol and smoking.  Drink at least 80 oz of water per day and monitor your carbohydrate intake daily.    Prediabetes -     Hemoglobin A1c -     CMP14+EGFR  Overweight (BMI 25.0-29.9) -     phentermine (ADIPEX-P) 37.5 MG tablet; Take 1 tablet (37.5 mg total) by mouth daily before breakfast.  Bilateral carpal tunnel syndrome -     celecoxib (CELEBREX) 200 MG capsule; Take 1 capsule (200 mg total) by mouth daily. Follow up with Ortho as instructed   Patient has been counseled on age-appropriate routine health concerns for screening and prevention. These are reviewed and up-to-date. Referrals have been placed accordingly. Immunizations are up-to-date or declined.    Subjective:   Chief Complaint  Patient presents with   Hypertension   Hypertension Pertinent negatives include no blurred vision, chest pain,  headaches, malaise/fatigue, palpitations or shortness of breath.  Sarah Cruz 55 y.o. female presents to office today for HTN. She  has a past medical history of Allergy, Blood transfusion without reported diagnosis, Childhood asthma, COPD (chronic obstructive pulmonary disease) ([place]), Hyperlipidemia, Hypertension, and Sarcoidosis.   HTN Blood pressure is well controlled. Denies chest pain, shortness of breath, palpitations, lightheadedness, dizziness, headaches or BLE edema.  Taking HCTZ 25 mg daily as prescribed.  BP Readings from Last 3 Encounters:  10/12/20 121/82  04/12/20 124/79  01/12/20 (!) 144/89     Prediabetes Well controled. LDL at goal less than 100 with atorvastatin 20 mg daily.  Lab Results  Component Value Date   HGBA1C 5.9 (H) 04/12/2020    Lab Results  Component Value Date   LDLCALC 81 04/12/2020    Review of Systems  Constitutional:  Negative for fever, malaise/fatigue and weight loss.  HENT: Negative.  Negative for nosebleeds.   Eyes: Negative.  Negative for blurred vision, double vision and photophobia.  Respiratory: Negative.  Negative for cough and shortness of breath.   Cardiovascular: Negative.  Negative for chest pain, palpitations and leg swelling.  Gastrointestinal: Negative.  Negative for heartburn, nausea and vomiting.  Musculoskeletal:  Positive for joint pain. Negative for myalgias.  Neurological: Negative.  Negative for dizziness, focal weakness, seizures and headaches.  Psychiatric/Behavioral: Negative.  Negative for suicidal ideas.    Past Medical History:  Diagnosis Date   Allergy    Blood transfusion without  reported diagnosis    Childhood asthma    COPD (chronic obstructive pulmonary disease) ([place])    Hyperlipidemia    Hypertension    Sarcoidosis     Past Surgical History:  Procedure Laterality Date   ABDOMINAL HYSTERECTOMY     ENDOBRONCHIAL ULTRASOUND Bilateral 12/07/2015   Procedure: ENDOBRONCHIAL ULTRASOUND;  Surgeon:  Javier Glazier, MD;  Location: [facility];  Service: Cardiopulmonary;  Laterality: Bilateral;    Family History  Problem Relation Age of Onset   Heart failure Mother    Diabetes Mother    Hypertension Mother    Arthritis Mother    Brain cancer Sister    Sickle cell anemia Other    Brain cancer Other    Asthma Other    Rheumatologic disease Neg Hx    Colon cancer Neg Hx    Esophageal cancer Neg Hx    Stomach cancer Neg Hx    Rectal cancer Neg Hx     Social History Reviewed with no changes to be made today.   Outpatient Medications Prior to Visit  Medication Sig Dispense Refill   acetaminophen (TYLENOL) 500 MG tablet Take 1,000 mg by mouth 2 (two) times daily as needed for mild pain or headache.     nicotine (NICODERM CQ - DOSED IN MG/24 HR) 7 mg/24hr patch Place 1 patch (7 mg total) onto the skin daily. 28 patch 3   albuterol (VENTOLIN HFA) 108 (90 Base) MCG/ACT inhaler Inhale 2 puffs into the lungs every 6 (six) hours as needed. 8.5 g 1   atorvastatin (LIPITOR) 20 MG tablet TAKE 1 TABLET (20 MG TOTAL) BY MOUTH DAILY. 90 tablet 1   hydrochlorothiazide (HYDRODIURIL) 25 MG tablet TAKE 1 TABLET (25 MG TOTAL) BY MOUTH DAILY. 90 tablet 0   phentermine (ADIPEX-P) 37.5 MG tablet Take 1 tablet (37.5 mg total) by mouth daily before breakfast. 30 tablet 1   budesonide-formoterol (SYMBICORT) 80-4.5 MCG/ACT inhaler INHALE 2 PUFFS INTO THE LUNGS TWICE DAILY. 10.2 g 3   celecoxib (CELEBREX) 200 MG capsule TAKE 1 CAPSULE (200 MG TOTAL) BY MOUTH 2 (TWO) TIMES DAILY. (Patient not taking: Reported on 10/12/2020) 60 capsule 3   ibuprofen (ADVIL) 600 MG tablet Take 1 tablet (600 mg total) by mouth every 8 (eight) hours as needed. (Patient not taking: Reported on 10/12/2020) 60 tablet 1   No facility-administered medications prior to visit.    No Known Allergies     Objective:    BP 121/82   Pulse 71   Ht 5' 4.5" (1.638 m)   Wt 155 lb (70.3 kg)   SpO2 99%   BMI 26.19 kg/m  Wt Readings  from Last 3 Encounters:  10/12/20 155 lb (70.3 kg)  04/12/20 159 lb 9.6 oz (72.4 kg)  01/12/20 163 lb (73.9 kg)    Physical Exam Vitals and nursing note reviewed.  Constitutional:      Appearance: She is well-developed.  HENT:     Head: Normocephalic and atraumatic.  Cardiovascular:     Rate and Rhythm: Normal rate and regular rhythm.     Heart sounds: Normal heart sounds. No murmur heard.   No friction rub. No gallop.  Pulmonary:     Effort: Pulmonary effort is normal. No tachypnea or respiratory distress.     Breath sounds: Normal breath sounds. No decreased breath sounds, wheezing, rhonchi or rales.  Chest:     Chest wall: No tenderness.  Abdominal:     General: Bowel sounds are normal.  Palpations: Abdomen is soft.  Musculoskeletal:        General: Normal range of motion.     Cervical back: Normal range of motion.  Skin:    General: Skin is warm and dry.  Neurological:     Mental Status: She is alert and oriented to person, place, and time.     Coordination: Coordination normal.  Psychiatric:        Behavior: Behavior normal. Behavior is cooperative.        Thought Content: Thought content normal.        Judgment: Judgment normal.         Patient has been counseled extensively about nutrition and exercise as well as the importance of adherence with medications and regular follow-up. The patient was given clear instructions to go to ER or return to medical center if symptoms don't improve, worsen or new problems develop. The patient verbalized understanding.   Follow-up: Return in about 3 months (around 01/12/2021).   Gildardo Pounds, FNP-BC [facility] [place], [place]   10/12/2020, 3:01 PM

## 2020-10-13 ENCOUNTER — Ambulatory Visit: Payer: 59 | Admitting: Nurse Practitioner

## 2020-10-13 LAB — CMP14+EGFR
ALT: 24 IU/L (ref 0–32)
AST: 21 IU/L (ref 0–40)
Albumin/Globulin Ratio: 1.8 (ref 1.2–2.2)
Albumin: 4.3 g/dL (ref 3.8–4.9)
Alkaline Phosphatase: 66 IU/L (ref 44–121)
BUN/Creatinine Ratio: 21 (ref 9–23)
BUN: 20 mg/dL (ref 6–24)
Bilirubin Total: <0.2 mg/dL (ref 0.0–1.2)
CO2: 25 mmol/L (ref 20–29)
Calcium: 9.1 mg/dL (ref 8.7–10.2)
Chloride: 101 mmol/L (ref 96–106)
Creatinine, Ser: 0.96 mg/dL (ref 0.57–1.00)
Globulin, Total: 2.4 g/dL (ref 1.5–4.5)
Glucose: 98 mg/dL (ref 65–99)
Potassium: 4.6 mmol/L (ref 3.5–5.2)
Sodium: 140 mmol/L (ref 134–144)
Total Protein: 6.7 g/dL (ref 6.0–8.5)
eGFR: 70 mL/min/{1.73_m2} (ref >59)

## 2020-10-13 LAB — HEMOGLOBIN A1C
Est. average glucose Bld gHb Est-mCnc: 126 mg/dL
Hgb A1c MFr Bld: 6 % — ABNORMAL HIGH (ref 4.8–5.6)

## 2020-10-19 ENCOUNTER — Other Ambulatory Visit: Payer: Self-pay

## 2020-10-28 ENCOUNTER — Other Ambulatory Visit: Payer: Self-pay

## 2020-11-12 ENCOUNTER — Other Ambulatory Visit: Payer: Self-pay

## 2020-11-26 ENCOUNTER — Other Ambulatory Visit: Payer: Self-pay

## 2020-12-15 ENCOUNTER — Other Ambulatory Visit: Payer: Self-pay

## 2020-12-21 ENCOUNTER — Other Ambulatory Visit: Payer: Self-pay

## 2021-01-12 ENCOUNTER — Ambulatory Visit: Payer: 59 | Admitting: Nurse Practitioner

## 2021-01-17 ENCOUNTER — Encounter: Payer: Self-pay | Admitting: Nurse Practitioner

## 2021-01-18 ENCOUNTER — Other Ambulatory Visit: Payer: Self-pay

## 2021-01-18 ENCOUNTER — Ambulatory Visit: Payer: Self-pay | Attending: Nurse Practitioner | Admitting: Nurse Practitioner

## 2021-01-18 DIAGNOSIS — E782 Mixed hyperlipidemia: Secondary | ICD-10-CM

## 2021-01-18 DIAGNOSIS — I10 Essential (primary) hypertension: Secondary | ICD-10-CM

## 2021-01-18 DIAGNOSIS — Z8619 Personal history of other infectious and parasitic diseases: Secondary | ICD-10-CM

## 2021-01-18 MED ORDER — HYDROCHLOROTHIAZIDE 25 MG PO TABS
25.0000 mg | ORAL_TABLET | Freq: Every day | ORAL | 1 refills | Status: DC
  Filled 2021-01-18 – 2021-02-23 (×3): qty 30, 30d supply, fill #0
  Filled 2021-02-23 – 2021-03-24 (×2): qty 30, 30d supply, fill #1
  Filled 2021-04-25: qty 30, 30d supply, fill #2
  Filled 2021-05-25: qty 30, 30d supply, fill #3
  Filled 2021-06-24: qty 30, 30d supply, fill #4

## 2021-01-18 MED ORDER — ATORVASTATIN CALCIUM 20 MG PO TABS
ORAL_TABLET | ORAL | 1 refills | Status: DC
  Filled 2021-01-18 – 2021-02-23 (×3): qty 30, 30d supply, fill #0
  Filled 2021-02-23 – 2021-03-24 (×2): qty 30, 30d supply, fill #1
  Filled 2021-04-25: qty 30, 30d supply, fill #2
  Filled 2021-05-25: qty 30, 30d supply, fill #3
  Filled 2021-06-24: qty 30, 30d supply, fill #4

## 2021-01-18 NOTE — Progress Notes (Addendum)
Virtual Visit via Telephone Note Due to national recommendations of social distancing due to [place] 19, telehealth visit is felt to be most appropriate for this patient at this time.  I discussed the limitations, risks, security and privacy concerns of performing an evaluation and management service by telephone and the availability of in person appointments. I also discussed with the patient that there may be a patient responsible charge related to this service. The patient expressed understanding and agreed to proceed.    I connected with Sarah Cruz on 01/18/21  at   3:30 PM EST  EDT by telephone and verified that I am speaking with the correct person using two identifiers.  Location of Patient: Private Residence   Location of Provider: [facility] [place] [facility] [place] participating in Telemedicine visit: Geryl Rankins FNP-BC [facility]    History of Present Illness: Telemedicine visit for: Essential Hypertension Blood pressure is well controlled. She has not checked her blood pressure at home for a reading today. She endorses adherence taking HCTZ 25 mg daily as prescribed.  BP Readings from Last 3 Encounters:  10/12/20 121/82  04/12/20 124/79  01/12/20 (!) 144/89     Endorses a history of HPV infection. She is s/p hysterectomy. Will obtain vaginal swabbing for testing of HPV.   Past Medical History:  Diagnosis Date   Allergy    Blood transfusion without reported diagnosis    Childhood asthma    COPD (chronic obstructive pulmonary disease) ([place])    Hyperlipidemia    Hypertension    Sarcoidosis     Past Surgical History:  Procedure Laterality Date   ABDOMINAL HYSTERECTOMY     ENDOBRONCHIAL ULTRASOUND Bilateral 12/07/2015   Procedure: ENDOBRONCHIAL ULTRASOUND;  Surgeon: Javier Glazier, MD;  Location: [facility];  Service: Cardiopulmonary;  Laterality: Bilateral;    Family History  Problem Relation Age of Onset   Heart failure Mother     Diabetes Mother    Hypertension Mother    Arthritis Mother    Brain cancer Sister    Sickle cell anemia Other    Brain cancer Other    Asthma Other    Rheumatologic disease Neg Hx    Colon cancer Neg Hx    Esophageal cancer Neg Hx    Stomach cancer Neg Hx    Rectal cancer Neg Hx     Social History   Socioeconomic History   Marital status: Divorced    Spouse name: Not on file   Number of children: 3   Years of education: Not on file   Highest education level: Not on file  Occupational History   Not on file  Tobacco Use   Smoking status: Every Day    Packs/day: 0.50    Years: 38.00    Pack years: 19.00    Types: Cigarettes    Start date: 04/08/1977   Smokeless tobacco: Never   Tobacco comments:    down to 0.5ppd  Vaping Use   Vaping Use: Never used  Substance and Sexual Activity   Alcohol use: No    Comment: Remote EtOH   Drug use: No    Comment: 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 Moved to [facility] in November 2016. Prior travel to [place]. No travel outside the [place]. 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 No  pets currently. No bird, mold, or hot tub exposure. No history of volunteer work or residence in a homeless shelter. No history of incarceration. Does have prior exposure to a nephew with TB but has always had a negative PPD skin test.    Social Determinants of Health   Financial Resource Strain: Not on file  Food Insecurity: Not on file  Transportation Needs: Not on file  Physical Activity: Not on file  Stress: Not on file  Social Connections: Not on file     Observations/Objective: Awake, alert and oriented x 3   Review of Systems  Constitutional:  Negative for fever, malaise/fatigue and weight loss.  HENT: Negative.  Negative for nosebleeds.   Eyes: Negative.  Negative for blurred vision, double vision and photophobia.   Respiratory: Negative.  Negative for cough and shortness of breath.   Cardiovascular: Negative.  Negative for chest pain, palpitations and leg swelling.  Gastrointestinal: Negative.  Negative for heartburn, nausea and vomiting.  Musculoskeletal: Negative.  Negative for myalgias.  Neurological: Negative.  Negative for dizziness, focal weakness, seizures and headaches.  Psychiatric/Behavioral: Negative.  Negative for suicidal ideas.    Assessment and Plan: Diagnoses and all orders for this visit:  Essential hypertension -     hydrochlorothiazide (HYDRODIURIL) 25 MG tablet; Take 1 tablet (25 mg total) by mouth daily.  Mixed hyperlipidemia -     atorvastatin (LIPITOR) 20 MG tablet; TAKE 1 TABLET (20 MG TOTAL) BY MOUTH DAILY.  History of HPV infection -     Cervicovaginal ancillary only -     Cytology - PAP    Follow Up Instructions Return if symptoms worsen or fail to improve.     I discussed the assessment and treatment plan with the patient. The patient was provided an opportunity to ask questions and all were answered. The patient agreed with the plan and demonstrated an understanding of the instructions.   The patient was advised to call back or seek an in-person evaluation if the symptoms worsen or if the condition fails to improve as anticipated.  I provided  10 minutes of non-face-to-face time during this encounter including median intraservice time, reviewing previous notes, labs, imaging, medications and explaining diagnosis and management.  Gildardo Pounds, FNP-BC

## 2021-01-20 ENCOUNTER — Encounter: Payer: Self-pay | Admitting: Nurse Practitioner

## 2021-01-25 ENCOUNTER — Ambulatory Visit: Payer: Medicaid Other | Attending: Nurse Practitioner

## 2021-01-25 ENCOUNTER — Other Ambulatory Visit: Payer: Self-pay

## 2021-02-04 ENCOUNTER — Other Ambulatory Visit: Payer: Self-pay

## 2021-02-23 ENCOUNTER — Other Ambulatory Visit: Payer: Self-pay

## 2021-02-24 ENCOUNTER — Other Ambulatory Visit: Payer: Self-pay

## 2021-03-22 ENCOUNTER — Other Ambulatory Visit: Payer: Self-pay

## 2021-03-24 ENCOUNTER — Other Ambulatory Visit: Payer: Self-pay

## 2021-03-25 ENCOUNTER — Other Ambulatory Visit: Payer: Self-pay

## 2021-04-25 ENCOUNTER — Other Ambulatory Visit: Payer: Self-pay

## 2021-04-28 ENCOUNTER — Other Ambulatory Visit: Payer: Self-pay

## 2021-05-11 ENCOUNTER — Other Ambulatory Visit: Payer: Self-pay

## 2021-05-23 ENCOUNTER — Other Ambulatory Visit: Payer: Self-pay

## 2021-05-25 ENCOUNTER — Other Ambulatory Visit: Payer: Self-pay

## 2021-05-26 ENCOUNTER — Other Ambulatory Visit: Payer: Self-pay

## 2021-05-31 ENCOUNTER — Other Ambulatory Visit: Payer: Self-pay

## 2021-06-24 ENCOUNTER — Other Ambulatory Visit: Payer: Self-pay

## 2021-07-26 ENCOUNTER — Other Ambulatory Visit: Payer: Self-pay

## 2021-07-26 ENCOUNTER — Other Ambulatory Visit: Payer: Self-pay | Admitting: Nurse Practitioner

## 2021-07-26 DIAGNOSIS — E782 Mixed hyperlipidemia: Secondary | ICD-10-CM

## 2021-07-26 DIAGNOSIS — I10 Essential (primary) hypertension: Secondary | ICD-10-CM

## 2021-07-26 NOTE — Telephone Encounter (Signed)
Pt stopped by office requesting refill on the hydrochlorothiazide and cholesterol meds,  please conact with an update thanks. [phone]

## 2021-07-27 ENCOUNTER — Other Ambulatory Visit: Payer: Self-pay

## 2021-07-27 MED ORDER — ATORVASTATIN CALCIUM 20 MG PO TABS
ORAL_TABLET | ORAL | 0 refills | Status: DC
  Filled 2021-07-27: qty 30, 30d supply, fill #0

## 2021-07-27 MED ORDER — HYDROCHLOROTHIAZIDE 25 MG PO TABS
25.0000 mg | ORAL_TABLET | Freq: Every day | ORAL | 0 refills | Status: DC
  Filled 2021-07-27: qty 30, 30d supply, fill #0

## 2021-08-24 ENCOUNTER — Encounter: Payer: Self-pay | Admitting: Nurse Practitioner

## 2021-08-24 ENCOUNTER — Other Ambulatory Visit (HOSPITAL_COMMUNITY)
Admission: RE | Admit: 2021-08-24 | Discharge: 2021-08-24 | Disposition: A | Discharge status: Home / Self Care | Payer: Medicaid Other | Source: Ambulatory Visit | Attending: Nurse Practitioner | Admitting: Nurse Practitioner

## 2021-08-24 ENCOUNTER — Other Ambulatory Visit: Payer: Self-pay

## 2021-08-24 ENCOUNTER — Other Ambulatory Visit: Payer: Self-pay | Admitting: Family Medicine

## 2021-08-24 ENCOUNTER — Ambulatory Visit: Payer: Medicaid Other | Attending: Nurse Practitioner | Admitting: Nurse Practitioner

## 2021-08-24 VITALS — BP 110/71 | HR 75 | Temp 99.0°F | Wt 161.2 lb

## 2021-08-24 DIAGNOSIS — R82998 Other abnormal findings in urine: Secondary | ICD-10-CM

## 2021-08-24 DIAGNOSIS — Z6827 Body mass index (BMI) 27.0-27.9, adult: Secondary | ICD-10-CM | POA: Insufficient documentation

## 2021-08-24 DIAGNOSIS — E782 Mixed hyperlipidemia: Secondary | ICD-10-CM

## 2021-08-24 DIAGNOSIS — J449 Chronic obstructive pulmonary disease, unspecified: Secondary | ICD-10-CM

## 2021-08-24 DIAGNOSIS — E663 Overweight: Secondary | ICD-10-CM | POA: Insufficient documentation

## 2021-08-24 DIAGNOSIS — R8290 Unspecified abnormal findings in urine: Secondary | ICD-10-CM | POA: Insufficient documentation

## 2021-08-24 DIAGNOSIS — I10 Essential (primary) hypertension: Secondary | ICD-10-CM

## 2021-08-24 DIAGNOSIS — F17200 Nicotine dependence, unspecified, uncomplicated: Secondary | ICD-10-CM | POA: Insufficient documentation

## 2021-08-24 DIAGNOSIS — Z1231 Encounter for screening mammogram for malignant neoplasm of breast: Secondary | ICD-10-CM | POA: Insufficient documentation

## 2021-08-24 DIAGNOSIS — Z79899 Other long term (current) drug therapy: Secondary | ICD-10-CM | POA: Insufficient documentation

## 2021-08-24 DIAGNOSIS — Z7951 Long term (current) use of inhaled steroids: Secondary | ICD-10-CM | POA: Insufficient documentation

## 2021-08-24 DIAGNOSIS — R7303 Prediabetes: Secondary | ICD-10-CM | POA: Insufficient documentation

## 2021-08-24 DIAGNOSIS — R7989 Other specified abnormal findings of blood chemistry: Secondary | ICD-10-CM

## 2021-08-24 LAB — POCT URINALYSIS DIP (CLINITEK)
Bilirubin, UA: NEGATIVE
Glucose, UA: NEGATIVE mg/dL
Ketones, POC UA: NEGATIVE mg/dL
Leukocytes, UA: NEGATIVE
Nitrite, UA: NEGATIVE
POC PROTEIN,UA: NEGATIVE
Spec Grav, UA: 1.025 (ref 1.010–1.025)
Urobilinogen, UA: 0.2 E.U./dL
pH, UA: 5.5 (ref 5.0–8.0)

## 2021-08-24 MED ORDER — ATORVASTATIN CALCIUM 20 MG PO TABS
20.0000 mg | ORAL_TABLET | Freq: Every day | ORAL | 3 refills | Status: DC
  Filled 2021-08-24: qty 90, 90d supply, fill #0
  Filled 2021-11-24: qty 90, 90d supply, fill #1

## 2021-08-24 MED ORDER — PHENTERMINE HCL 37.5 MG PO TABS: 37.5000 mg | ORAL_TABLET | Freq: Every day | ORAL | 1 refills | Status: DC

## 2021-08-24 MED ORDER — BUDESONIDE-FORMOTEROL FUMARATE 80-4.5 MCG/ACT IN AERO
2.0000 | INHALATION_SPRAY | Freq: Two times a day (BID) | RESPIRATORY_TRACT | 6 refills | Status: DC
  Filled 2021-08-24: qty 10.2, 30d supply, fill #0

## 2021-08-24 MED ORDER — HYDROCHLOROTHIAZIDE 25 MG PO TABS
25.0000 mg | ORAL_TABLET | Freq: Every day | ORAL | 1 refills | Status: DC
  Filled 2021-08-24: qty 90, 90d supply, fill #0
  Filled 2021-11-24: qty 90, 90d supply, fill #1

## 2021-08-24 NOTE — Progress Notes (Signed)
Assessment & Plan:  Sarah Cruz was seen today for hypertension and odor.  Diagnoses and all orders for this visit:  Essential hypertension -     hydrochlorothiazide (HYDRODIURIL) 25 MG tablet; Take 1 tablet (25 mg total) by mouth daily. -     CMP14+EGFR  Dark urine -     POCT URINALYSIS DIP (CLINITEK)  COPD GOLD II/ still smoking  Symptoms well controlled -     budesonide-formoterol (SYMBICORT) 80-4.5 MCG/ACT inhaler; Inhale 2 puffs into the lungs 2 (two) times daily.  Mixed hyperlipidemia -     atorvastatin (LIPITOR) 20 MG tablet; Take 1 tablet (20 mg total) by mouth daily. -     Lipid panel  Overweight (BMI 25.0-29.9) -     phentermine (ADIPEX-P) 37.5 MG tablet; Take 1 tablet (37.5 mg total) by mouth daily before breakfast.  Prediabetes -     Hemoglobin A1c  Abnormal CBC -     CBC with Differential  Bad odor of urine -     Cervicovaginal ancillary only    Patient has been counseled on age-appropriate routine health concerns for screening and prevention. These are reviewed and up-to-date. Referrals have been placed accordingly. Immunizations are up-to-date or declined.    Subjective:   Chief Complaint  Patient presents with   Hypertension   odor    Sarah Cruz 56 y.o. female presents to office today for follow up to HTN and with concerns of possible UTI symptoms   Patient has been counseled on age-appropriate routine health concerns for screening and prevention. These are reviewed and up-to-date. Referrals have been placed accordingly. Immunizations are up-to-date or declined.     COLONOSCOPY: UTD. Due this year MAMMOGRAM: OVERDUE. Referred to [facility]   Notes dark malodorous urine. Denies flank pain, hematuria, N/V or pelvic pain.    Hypertension Blood pressure is well controlled with hydrochlorothiazide 25 mg daily. BP Readings from Last 3 Encounters:  08/24/21 110/71  10/12/20 121/82  04/12/20 124/79      Review of Systems  Constitutional:   Negative for fever, malaise/fatigue and weight loss.  HENT: Negative.  Negative for nosebleeds.   Eyes: Negative.  Negative for blurred vision, double vision and photophobia.  Respiratory: Negative.  Negative for cough and shortness of breath.   Cardiovascular: Negative.  Negative for chest pain, palpitations and leg swelling.  Gastrointestinal: Negative.  Negative for heartburn, nausea and vomiting.  Genitourinary:        SEE HPI  Musculoskeletal: Negative.  Negative for myalgias.  Neurological: Negative.  Negative for dizziness, focal weakness, seizures and headaches.  Psychiatric/Behavioral: Negative.  Negative for suicidal ideas.     Past Medical History:  Diagnosis Date   Allergy    Blood transfusion without reported diagnosis    Childhood asthma    COPD (chronic obstructive pulmonary disease) ([place])    Hyperlipidemia    Hypertension    Sarcoidosis     Past Surgical History:  Procedure Laterality Date   ABDOMINAL HYSTERECTOMY     ENDOBRONCHIAL ULTRASOUND Bilateral 12/07/2015   Procedure: ENDOBRONCHIAL ULTRASOUND;  Surgeon: Javier Glazier, MD;  [facility];  Service: Cardiopulmonary;  Laterality: Bilateral;    Family History  Problem Relation Age of Onset   Heart failure Mother    Diabetes Mother    Hypertension Mother    Arthritis Mother    Brain cancer Sister    Sickle cell anemia Other    Brain cancer Other    Asthma Other    Rheumatologic  disease Neg Hx    Colon cancer Neg Hx    Esophageal cancer Neg Hx    Stomach cancer Neg Hx    Rectal cancer Neg Hx     Social History Reviewed with no changes to be made today.   Outpatient Medications Prior to Visit  Medication Sig Dispense Refill   acetaminophen (TYLENOL) 500 MG tablet Take 1,000 mg by mouth 2 (two) times daily as needed for mild pain or headache.     albuterol (VENTOLIN HFA) 108 (90 Base) MCG/ACT inhaler Inhale 2 puffs into the lungs every 6 (six) hours as needed. 8.5 g 1   nicotine  (NICODERM CQ - DOSED IN MG/24 HR) 7 mg/24hr patch Place 1 patch (7 mg total) onto the skin daily. 28 patch 3   atorvastatin (LIPITOR) 20 MG tablet TAKE 1 TABLET (20 MG TOTAL) BY MOUTH DAILY. 30 tablet 0   hydrochlorothiazide (HYDRODIURIL) 25 MG tablet Take 1 tablet (25 mg total) by mouth daily. 30 tablet 0   budesonide-formoterol (SYMBICORT) 80-4.5 MCG/ACT inhaler Inhale 2 puffs into the lungs 2 (two) times daily. 10.2 g 3   phentermine (ADIPEX-P) 37.5 MG tablet Take 1 tablet (37.5 mg total) by mouth daily before breakfast. (Patient not taking: Reported on 08/24/2021) 30 tablet 1   No facility-administered medications prior to visit.    No Known Allergies     Objective:    BP 110/71 (BP Location: Left Arm, Patient Position: Sitting, Cuff Size: Normal)   Pulse 75   Temp 99 F (37.2 C) (Oral)   Wt 161 lb 3.2 oz (73.1 kg)   SpO2 97%   BMI 27.24 kg/m  Wt Readings from Last 3 Encounters:  08/24/21 161 lb 3.2 oz (73.1 kg)  10/12/20 155 lb (70.3 kg)  04/12/20 159 lb 9.6 oz (72.4 kg)    Physical Exam Vitals and nursing note reviewed.  Constitutional:      Appearance: She is well-developed.  HENT:     Head: Normocephalic and atraumatic.  Cardiovascular:     Rate and Rhythm: Normal rate and regular rhythm.     Heart sounds: Normal heart sounds. No murmur heard.    No friction rub. No gallop.  Pulmonary:     Effort: Pulmonary effort is normal. No tachypnea or respiratory distress.     Breath sounds: Normal breath sounds. No decreased breath sounds, wheezing, rhonchi or rales.  Chest:     Chest wall: No tenderness.  Abdominal:     General: Bowel sounds are normal.     Palpations: Abdomen is soft.  Musculoskeletal:        General: Normal range of motion.     Cervical back: Normal range of motion.  Skin:    General: Skin is warm and dry.  Neurological:     Mental Status: She is alert and oriented to person, place, and time.     Coordination: Coordination normal.  Psychiatric:         Behavior: Behavior normal. Behavior is cooperative.        Thought Content: Thought content normal.        Judgment: Judgment normal.          Patient has been counseled extensively about nutrition and exercise as well as the importance of adherence with medications and regular follow-up. The patient was given clear instructions to go to ER or return to medical center if symptoms don't improve, worsen or new problems develop. The patient verbalized understanding.   Follow-up: Return in  about 6 months (around 02/24/2022).   Gildardo Pounds, FNP-BC [facility], [place]   08/24/2021, 4:59 PM

## 2021-08-24 NOTE — Progress Notes (Signed)
Ph Balance Urine smell off Urine test   F/up HTN

## 2021-08-25 LAB — CMP14+EGFR
ALT: 22 IU/L (ref 0–32)
AST: 22 IU/L (ref 0–40)
Albumin/Globulin Ratio: 2.1 (ref 1.2–2.2)
Albumin: 4.6 g/dL (ref 3.8–4.9)
Alkaline Phosphatase: 68 IU/L (ref 44–121)
BUN/Creatinine Ratio: 16 (ref 9–23)
BUN: 16 mg/dL (ref 6–24)
Bilirubin Total: 0.2 mg/dL (ref 0.0–1.2)
CO2: 22 mmol/L (ref 20–29)
Calcium: 9.5 mg/dL (ref 8.7–10.2)
Chloride: 102 mmol/L (ref 96–106)
Creatinine, Ser: 0.97 mg/dL (ref 0.57–1.00)
Globulin, Total: 2.2 g/dL (ref 1.5–4.5)
Glucose: 81 mg/dL (ref 70–99)
Potassium: 4.1 mmol/L (ref 3.5–5.2)
Sodium: 141 mmol/L (ref 134–144)
Total Protein: 6.8 g/dL (ref 6.0–8.5)
eGFR: 69 mL/min/{1.73_m2} (ref >59)

## 2021-08-25 LAB — CBC WITH DIFFERENTIAL/PLATELET
Basophils Absolute: 0.1 10*3/uL (ref 0.0–0.2)
Basos: 1 %
EOS (ABSOLUTE): 0.3 10*3/uL (ref 0.0–0.4)
Eos: 5 %
Hematocrit: 40.3 % (ref 34.0–46.6)
Hemoglobin: 12.5 g/dL (ref 11.1–15.9)
Immature Grans (Abs): 0 10*3/uL (ref 0.0–0.1)
Immature Granulocytes: 0 %
Lymphocytes Absolute: 2.1 10*3/uL (ref 0.7–3.1)
Lymphs: 35 %
MCH: 23.4 pg — ABNORMAL LOW (ref 26.6–33.0)
MCHC: 31 g/dL — ABNORMAL LOW (ref 31.5–35.7)
MCV: 76 fL — ABNORMAL LOW (ref 79–97)
Monocytes Absolute: 0.6 10*3/uL (ref 0.1–0.9)
Monocytes: 9 %
Neutrophils Absolute: 2.9 10*3/uL (ref 1.4–7.0)
Neutrophils: 50 %
Platelets: 306 10*3/uL (ref 150–450)
RBC: 5.34 x10E6/uL — ABNORMAL HIGH (ref 3.77–5.28)
RDW: 14.7 % (ref 11.7–15.4)
WBC: 5.9 10*3/uL (ref 3.4–10.8)

## 2021-08-25 LAB — HEMOGLOBIN A1C
Est. average glucose Bld gHb Est-mCnc: 126 mg/dL
Hgb A1c MFr Bld: 6 % — ABNORMAL HIGH (ref 4.8–5.6)

## 2021-08-25 LAB — LIPID PANEL
Chol/HDL Ratio: 2.2 ratio (ref 0.0–4.4)
Cholesterol, Total: 178 mg/dL (ref 100–199)
HDL: 80 mg/dL (ref >39)
LDL Chol Calc (NIH): 80 mg/dL (ref 0–99)
Triglycerides: 104 mg/dL (ref 0–149)
VLDL Cholesterol Cal: 18 mg/dL (ref 5–40)

## 2021-08-26 ENCOUNTER — Other Ambulatory Visit: Payer: Self-pay

## 2021-08-26 ENCOUNTER — Other Ambulatory Visit: Payer: Self-pay | Admitting: Nurse Practitioner

## 2021-08-26 DIAGNOSIS — N760 Acute vaginitis: Secondary | ICD-10-CM

## 2021-08-26 LAB — CERVICOVAGINAL ANCILLARY ONLY
Bacterial Vaginitis (gardnerella): POSITIVE — AB
Candida Glabrata: NEGATIVE
Candida Vaginitis: POSITIVE — AB
Chlamydia: NEGATIVE
Comment: NEGATIVE
Comment: NEGATIVE
Comment: NEGATIVE
Comment: NEGATIVE
Comment: NEGATIVE
Comment: NORMAL
Neisseria Gonorrhea: NEGATIVE
Trichomonas: NEGATIVE

## 2021-08-26 MED ORDER — METRONIDAZOLE 500 MG PO TABS
500.0000 mg | ORAL_TABLET | Freq: Two times a day (BID) | ORAL | 0 refills | Status: AC
  Filled 2021-08-26: qty 14, 7d supply, fill #0

## 2021-08-26 MED ORDER — FLUCONAZOLE 150 MG PO TABS
150.0000 mg | ORAL_TABLET | Freq: Once | ORAL | 0 refills | Status: AC
  Filled 2021-08-26: qty 1, 1d supply, fill #0

## 2021-09-05 ENCOUNTER — Ambulatory Visit
Admission: RE | Admit: 2021-09-05 | Discharge: 2021-09-05 | Disposition: A | Discharge status: Home / Self Care | Payer: Medicaid Other | Source: Ambulatory Visit | Attending: Nurse Practitioner | Admitting: Nurse Practitioner

## 2021-09-05 DIAGNOSIS — Z1231 Encounter for screening mammogram for malignant neoplasm of breast: Secondary | ICD-10-CM

## 2021-11-15 ENCOUNTER — Other Ambulatory Visit: Payer: Self-pay

## 2021-11-24 ENCOUNTER — Other Ambulatory Visit: Payer: Self-pay

## 2021-12-06 ENCOUNTER — Emergency Department (HOSPITAL_BASED_OUTPATIENT_CLINIC_OR_DEPARTMENT_OTHER): Payer: Medicaid Other | Admitting: Radiology

## 2021-12-06 ENCOUNTER — Encounter: Payer: Self-pay | Admitting: Gastroenterology

## 2021-12-06 ENCOUNTER — Other Ambulatory Visit: Payer: Self-pay

## 2021-12-06 ENCOUNTER — Other Ambulatory Visit (HOSPITAL_BASED_OUTPATIENT_CLINIC_OR_DEPARTMENT_OTHER): Payer: Self-pay

## 2021-12-06 ENCOUNTER — Encounter (HOSPITAL_BASED_OUTPATIENT_CLINIC_OR_DEPARTMENT_OTHER): Payer: Self-pay

## 2021-12-06 ENCOUNTER — Emergency Department (HOSPITAL_BASED_OUTPATIENT_CLINIC_OR_DEPARTMENT_OTHER)
Admission: EM | Admit: 2021-12-06 | Discharge: 2021-12-06 | Disposition: A | Discharge status: Home / Self Care | Payer: Medicaid Other | Attending: Emergency Medicine | Admitting: Emergency Medicine

## 2021-12-06 DIAGNOSIS — Y92410 Unspecified street and highway as the place of occurrence of the external cause: Secondary | ICD-10-CM | POA: Insufficient documentation

## 2021-12-06 DIAGNOSIS — M545 Low back pain, unspecified: Secondary | ICD-10-CM | POA: Insufficient documentation

## 2021-12-06 MED ORDER — METHOCARBAMOL 500 MG PO TABS
500.0000 mg | ORAL_TABLET | Freq: Two times a day (BID) | ORAL | 0 refills | Status: DC
  Filled 2021-12-06 (×2): qty 20, 10d supply, fill #0

## 2021-12-06 MED ORDER — LIDOCAINE 5 % EX PTCH
1.0000 | MEDICATED_PATCH | CUTANEOUS | 0 refills | Status: DC
  Filled 2021-12-06 (×2): qty 30, 30d supply, fill #0

## 2021-12-06 MED ORDER — LIDOCAINE 5 % EX PTCH
1.0000 | MEDICATED_PATCH | Freq: Once | CUTANEOUS | Status: DC
  Administered 2021-12-06: 1 via TRANSDERMAL
  Filled 2021-12-06: qty 1

## 2021-12-06 MED ORDER — ACETAMINOPHEN 325 MG PO TABS
650.0000 mg | ORAL_TABLET | Freq: Once | ORAL | Status: AC
Start: 2021-12-06 — End: 2021-12-06
  Administered 2021-12-06: 650 mg via ORAL
  Filled 2021-12-06: qty 2

## 2021-12-06 NOTE — Discharge Instructions (Signed)
It was a pleasure taking care of you today!  Your imaging in the ED was negative for fracture or dislocations. You will feel more sore in the morning. You are prescribed Robaxin (muscle relaxer). Do not drive or operate heavy machinery while taking the muscle relaxer. You may take over the counter 600 mg Ibuprofen every 6 hours or 1,000 mg Tylenol every 6 hours as needed for pain for no more than 7 days. You may apply ice or heat to affected area for up to 15 minutes at a time. Ensure to place a barrier between your skin and the ice/heat. Return to the Emergency Department if you are experiencing increasing/worsening symptoms.
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
## 2021-12-06 NOTE — ED Notes (Signed)
Discharge paperwork given and verbally understood. 
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
## 2021-12-07 ENCOUNTER — Other Ambulatory Visit: Payer: Self-pay

## 2021-12-13 ENCOUNTER — Other Ambulatory Visit: Payer: Self-pay

## 2022-01-31 ENCOUNTER — Other Ambulatory Visit: Payer: Self-pay

## 2022-02-13 ENCOUNTER — Ambulatory Visit (HOSPITAL_COMMUNITY)
Admission: EM | Admit: 2022-02-13 | Discharge: 2022-02-13 | Disposition: A | Discharge status: Home / Self Care | Payer: Medicaid Other | Attending: Internal Medicine | Admitting: Internal Medicine

## 2022-02-13 ENCOUNTER — Encounter (HOSPITAL_COMMUNITY): Payer: Self-pay

## 2022-02-13 DIAGNOSIS — J441 Chronic obstructive pulmonary disease with (acute) exacerbation: Secondary | ICD-10-CM

## 2022-02-13 DIAGNOSIS — R062 Wheezing: Secondary | ICD-10-CM

## 2022-02-13 MED ORDER — IPRATROPIUM-ALBUTEROL 0.5-2.5 (3) MG/3ML IN SOLN
RESPIRATORY_TRACT | Status: AC
  Filled 2022-02-13: qty 3

## 2022-02-13 MED ORDER — CEFDINIR 300 MG PO CAPS: 300.0000 mg | ORAL_CAPSULE | Freq: Two times a day (BID) | ORAL | 0 refills | Status: AC

## 2022-02-13 MED ORDER — ACETAMINOPHEN 325 MG PO TABS
ORAL_TABLET | ORAL | Status: AC
  Filled 2022-02-13: qty 2

## 2022-02-13 MED ORDER — PREDNISONE 20 MG PO TABS: 40.0000 mg | ORAL_TABLET | Freq: Every day | ORAL | 0 refills | Status: AC

## 2022-02-13 MED ORDER — ACETAMINOPHEN 325 MG PO TABS
650.0000 mg | ORAL_TABLET | Freq: Once | ORAL | Status: AC
  Administered 2022-02-13: 650 mg via ORAL

## 2022-02-13 MED ORDER — BENZONATATE 200 MG PO CAPS: 200.0000 mg | ORAL_CAPSULE | Freq: Three times a day (TID) | ORAL | 0 refills | Status: DC | PRN

## 2022-02-13 MED ORDER — IPRATROPIUM-ALBUTEROL 0.5-2.5 (3) MG/3ML IN SOLN
3.0000 mL | Freq: Once | RESPIRATORY_TRACT | Status: AC
  Administered 2022-02-13: 3 mL via RESPIRATORY_TRACT

## 2022-02-13 NOTE — ED Provider Notes (Signed)
[facility]    CSN: [id number] Arrival date & time: 02/13/22  1713      History   Chief Complaint Chief Complaint  Patient presents with   Cough    HPI Sarah Cruz is a 57 y.o. female presents for evaluation of URI symptoms for 2 weeks. Patient reports associated symptoms of productive cough with phlegm, wheezing/shortness of breath, HA, and fevers. Denies N/V/D, sore throat, ear pain, body aches.  Patient has a history of COPD and currently smokes.  She also has a history of sarcoidosis.  Denies any hospitalizations for her COPD in the past year.  No known sick contacts and no recent travel. Pt is vaccinated for COVID. Pt is vaccinated for flu this season. Pt has taken DayQuil/NyQuil OTC for symptoms. Pt has no other concerns at this time.    Cough Associated symptoms: fever, shortness of breath and wheezing     Past Medical History:  Diagnosis Date   Allergy    Blood transfusion without reported diagnosis    Childhood asthma    COPD (chronic obstructive pulmonary disease) ([place])    Hyperlipidemia    Hypertension    Sarcoidosis     Patient Active Problem List   Diagnosis Date Noted   Primary osteoarthritis of first carpometacarpal joint of right hand 11/25/2019   Primary osteoarthritis of first carpometacarpal joint of left hand 11/25/2019   Cigarette smoker 06/18/2018   PAD (peripheral artery disease) ([place]) 04/09/2018   Sarcoidosis 12/29/2015   COPD GOLD II/ still smoking  12/29/2015   Essential hypertension 10/22/2015   Childhood asthma 10/22/2015   Dyspnea 10/22/2015   Exposure to TB 10/22/2015   Multiple lung nodules on CT 10/16/2015   Mediastinal adenopathy 10/16/2015    Past Surgical History:  Procedure Laterality Date   ABDOMINAL HYSTERECTOMY     ENDOBRONCHIAL ULTRASOUND Bilateral 12/07/2015   Procedure: ENDOBRONCHIAL ULTRASOUND;  Surgeon: Javier Glazier, MD;  Location: [facility];  Service: Cardiopulmonary;  Laterality: Bilateral;     OB History   No obstetric history on file.      Home Medications    Prior to Admission medications   Medication Sig Start Date End Date Taking? Authorizing Provider  benzonatate (TESSALON) 200 MG capsule Take 1 capsule (200 mg total) by mouth 3 (three) times daily as needed for cough. 02/13/22  Yes Melynda Ripple, NP  cefdinir (OMNICEF) 300 MG capsule Take 1 capsule (300 mg total) by mouth 2 (two) times daily for 7 days. 02/13/22 02/20/22 Yes Melynda Ripple, NP  predniSONE (DELTASONE) 20 MG tablet Take 2 tablets (40 mg total) by mouth daily with breakfast for 5 days. 02/13/22 02/18/22 Yes Melynda Ripple, NP  acetaminophen (TYLENOL) 500 MG tablet Take 1,000 mg by mouth 2 (two) times daily as needed for mild pain or headache.    [provider]  albuterol (VENTOLIN HFA) 108 (90 Base) MCG/ACT inhaler Inhale 2 puffs into the lungs every 6 (six) hours as needed. 10/12/20   Gildardo Pounds, NP  atorvastatin (LIPITOR) 20 MG tablet Take 1 tablet (20 mg total) by mouth daily. 08/24/21   Gildardo Pounds, NP  budesonide-formoterol (SYMBICORT) 80-4.5 MCG/ACT inhaler Inhale 2 puffs into the lungs 2 (two) times daily. 08/24/21   Gildardo Pounds, NP  hydrochlorothiazide (HYDRODIURIL) 25 MG tablet Take 1 tablet (25 mg total) by mouth daily. 08/24/21   Gildardo Pounds, NP  lidocaine (LIDODERM) 5 % Place 1 patch onto the skin daily. Remove &  Discard patch within 12 hours or as directed by MD 12/06/21   Blue, Soijett A, PA-C  methocarbamol (ROBAXIN) 500 MG tablet Take 1 tablet (500 mg total) by mouth 2 (two) times daily. 12/06/21   Blue, Soijett A, PA-C  nicotine (NICODERM CQ - DOSED IN MG/24 HR) 7 mg/24hr patch Place 1 patch (7 mg total) onto the skin daily. 09/16/18   Gildardo Pounds, NP  phentermine (ADIPEX-P) 37.5 MG tablet Take 1 tablet (37.5 mg total) by mouth daily before breakfast. 08/24/21   Gildardo Pounds, NP    Family History Family History  Problem Relation Age of Onset   Heart failure Mother     Diabetes Mother    Hypertension Mother    Arthritis Mother    Brain cancer Sister    Breast cancer Maternal Aunt    Sickle cell anemia Other    Brain cancer Other    Asthma Other    Rheumatologic disease Neg Hx    Colon cancer Neg Hx    Esophageal cancer Neg Hx    Stomach cancer Neg Hx    Rectal cancer Neg Hx     Social History Social History   Tobacco Use   Smoking status: Every Day    Packs/day: 0.50    Years: 38.00    Total pack years: 19.00    Types: Cigarettes    Start date: 04/08/1977   Smokeless tobacco: Never   Tobacco comments:    down to 0.5ppd  Vaping Use   Vaping Use: Never used  Substance Use Topics   Alcohol use: No    Comment: Remote EtOH   Drug use: No    Comment: 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   Review of Systems Review of Systems  Constitutional:  Positive for fever.  Respiratory:  Positive for cough, shortness of breath and wheezing.      Physical Exam Triage Vital Signs ED Triage Vitals  Enc Vitals Group     BP 02/13/22 1831 135/81     Pulse Rate 02/13/22 1831 69     Resp 02/13/22 1831 20     Temp 02/13/22 1831 99 F (37.2 C)     Temp Source 02/13/22 1831 Oral     SpO2 02/13/22 1831 93 %     Weight --      Height --      Head Circumference --      Peak Flow --      Pain Score 02/13/22 1834 0     Pain Loc --      Pain Edu? --      Excl. in [place]? --    No data found.  Updated Vital Signs BP 135/81   Pulse 69   Temp 99 F (37.2 C) (Oral)   Resp 20   SpO2 93%   Visual Acuity Right Eye Distance:   Left Eye Distance:   Bilateral Distance:    Right Eye Near:   Left Eye Near:    Bilateral Near:     Physical Exam Vitals and nursing note reviewed.  Constitutional:      General: She is not in acute distress.    Appearance: She is well-developed. She is not ill-appearing.  HENT:     Head: Normocephalic and atraumatic.     Right Ear: Tympanic membrane and ear canal  normal.     Left Ear: Tympanic membrane and ear  canal normal.     Nose: Congestion present.     Mouth/Throat:     Mouth: Mucous membranes are moist.     Pharynx: Oropharynx is clear. Uvula midline. No oropharyngeal exudate or posterior oropharyngeal erythema.     Tonsils: No tonsillar exudate or tonsillar abscesses.  Eyes:     Conjunctiva/sclera: Conjunctivae normal.     Pupils: Pupils are equal, round, and reactive to light.  Cardiovascular:     Rate and Rhythm: Normal rate and regular rhythm.     Heart sounds: Normal heart sounds.  Pulmonary:     Effort: Pulmonary effort is normal.     Breath sounds: Wheezing present.  Musculoskeletal:     Cervical back: Normal range of motion and neck supple.  Lymphadenopathy:     Cervical: No cervical adenopathy.  Skin:    General: Skin is warm and dry.  Neurological:     General: No focal deficit present.     Mental Status: She is alert and oriented to person, place, and time.  Psychiatric:        Mood and Affect: Mood normal.        Behavior: Behavior normal.      UC Treatments / Results  Labs (all labs ordered are listed, but only abnormal results are displayed) Labs Reviewed - No data to display  EKG   Radiology No results found.  Procedures Procedures (including critical care time)  Medications Ordered in UC Medications  acetaminophen (TYLENOL) tablet 650 mg (650 mg Oral Given 02/13/22 1853)  ipratropium-albuterol (DUONEB) 0.5-2.5 (3) MG/3ML nebulizer solution 3 mL (3 mLs Nebulization Given 02/13/22 1853)    Initial Impression / Assessment and Plan / UC Course  I have reviewed the triage vital signs and the nursing notes.  Pertinent labs & imaging results that were available during my care of the patient were reviewed by me and considered in my medical decision making (see chart for details).     Wheezing improved after nebulizer Tylenol in clinic fo HA per pt request.  Discussed COPD exacerbation.  Start cefdinir,  prednisone x 5 days Tessalon PRN Patient to continue albuterol inhaler and budesonide inhaler Rest and fluids PCP follow-up 2 to 3 days for recheck ER precautions reviewed and patient verbalized understanding Final Clinical Impressions(s) / UC Diagnoses   Final diagnoses:  Wheezing  COPD exacerbation ([place])     Discharge Instructions      Start cefdinir twice daily for 7 days Prednisone daily for 5 days Tessalon as needed for cough Rest and fluids Continue your budesonide steroid inhaler and albuterol inhaler Follow-up with your PCP in 2 days for recheck Please go to the emergency room if you have any worsening symptoms which includes but not limited to worsening shortness of breath/wheezing, continued fevers, or any new concerns that arise     ED Prescriptions     Medication Sig Dispense Auth. Provider   cefdinir (OMNICEF) 300 MG capsule Take 1 capsule (300 mg total) by mouth 2 (two) times daily for 7 days. 14 capsule Melynda Ripple, NP   predniSONE (DELTASONE) 20 MG tablet Take 2 tablets (40 mg total) by mouth daily with breakfast for 5 days. 10 tablet Melynda Ripple, NP   benzonatate (TESSALON) 200 MG capsule Take 1 capsule (200 mg total) by mouth 3 (three) times daily as needed for cough. 20 capsule Melynda Ripple, NP      PDMP not reviewed this encounter.   Melynda Ripple, NP 02/13/22  1905  

## 2022-02-13 NOTE — ED Triage Notes (Signed)
Pt reports she is having trouble breathing, fever, body aches cough chills, can't walk far x 2 weeks. Took Dayquil but no relief

## 2022-02-13 NOTE — Discharge Instructions (Addendum)
Start cefdinir twice daily for 7 days Prednisone daily for 5 days Tessalon as needed for cough Rest and fluids Continue your budesonide steroid inhaler and albuterol inhaler Follow-up with your PCP in 2 days for recheck Please go to the emergency room if you have any worsening symptoms which includes but not limited to worsening shortness of breath/wheezing, continued fevers, or any new concerns that arise

## 2022-02-24 ENCOUNTER — Encounter: Payer: Self-pay | Admitting: Nurse Practitioner

## 2022-02-24 ENCOUNTER — Other Ambulatory Visit: Payer: Self-pay

## 2022-02-24 ENCOUNTER — Ambulatory Visit: Payer: Medicaid Other | Attending: Nurse Practitioner | Admitting: Nurse Practitioner

## 2022-02-24 VITALS — BP 131/79 | HR 90 | Wt 148.6 lb

## 2022-02-24 DIAGNOSIS — J4489 Other specified chronic obstructive pulmonary disease: Secondary | ICD-10-CM | POA: Insufficient documentation

## 2022-02-24 DIAGNOSIS — Z760 Encounter for issue of repeat prescription: Secondary | ICD-10-CM | POA: Insufficient documentation

## 2022-02-24 DIAGNOSIS — J40 Bronchitis, not specified as acute or chronic: Secondary | ICD-10-CM

## 2022-02-24 DIAGNOSIS — Z79899 Other long term (current) drug therapy: Secondary | ICD-10-CM | POA: Insufficient documentation

## 2022-02-24 DIAGNOSIS — I10 Essential (primary) hypertension: Secondary | ICD-10-CM

## 2022-02-24 DIAGNOSIS — Z1211 Encounter for screening for malignant neoplasm of colon: Secondary | ICD-10-CM

## 2022-02-24 DIAGNOSIS — E782 Mixed hyperlipidemia: Secondary | ICD-10-CM

## 2022-02-24 DIAGNOSIS — R7303 Prediabetes: Secondary | ICD-10-CM

## 2022-02-24 DIAGNOSIS — F17200 Nicotine dependence, unspecified, uncomplicated: Secondary | ICD-10-CM | POA: Insufficient documentation

## 2022-02-24 DIAGNOSIS — J449 Chronic obstructive pulmonary disease, unspecified: Secondary | ICD-10-CM

## 2022-02-24 LAB — POCT GLYCOSYLATED HEMOGLOBIN (HGB A1C): HbA1c, POC (controlled diabetic range): 6.6 % (ref 0.0–7.0)

## 2022-02-24 LAB — GLUCOSE, POCT (MANUAL RESULT ENTRY): POC Glucose: 136 mg/dl — AB (ref 70–99)

## 2022-02-24 MED ORDER — ALBUTEROL SULFATE HFA 108 (90 BASE) MCG/ACT IN AERS
2.0000 | INHALATION_SPRAY | Freq: Four times a day (QID) | RESPIRATORY_TRACT | 1 refills | Status: DC | PRN
  Filled 2022-02-24: qty 6.7, 25d supply, fill #0

## 2022-02-24 MED ORDER — ATORVASTATIN CALCIUM 20 MG PO TABS
20.0000 mg | ORAL_TABLET | Freq: Every day | ORAL | 3 refills | Status: DC
  Filled 2022-02-24: qty 30, 30d supply, fill #0
  Filled 2022-03-27 (×2): qty 30, 30d supply, fill #1
  Filled 2022-04-24 (×2): qty 30, 30d supply, fill #2

## 2022-02-24 MED ORDER — PROMETHAZINE-DM 6.25-15 MG/5ML PO SYRP
5.0000 mL | ORAL_SOLUTION | Freq: Four times a day (QID) | ORAL | 0 refills | Status: DC | PRN
  Filled 2022-02-24: qty 240, 12d supply, fill #0

## 2022-02-24 MED ORDER — PREDNISONE 20 MG PO TABS
40.0000 mg | ORAL_TABLET | Freq: Every day | ORAL | 0 refills | Status: AC
  Filled 2022-02-24: qty 10, 5d supply, fill #0

## 2022-02-24 MED ORDER — HYDROCHLOROTHIAZIDE 25 MG PO TABS
25.0000 mg | ORAL_TABLET | Freq: Every day | ORAL | 1 refills | Status: DC
  Filled 2022-02-24: qty 30, 30d supply, fill #0
  Filled 2022-03-27 (×2): qty 30, 30d supply, fill #1
  Filled 2022-04-24 (×2): qty 30, 30d supply, fill #2

## 2022-02-24 MED ORDER — BUDESONIDE-FORMOTEROL FUMARATE 80-4.5 MCG/ACT IN AERO
2.0000 | INHALATION_SPRAY | Freq: Two times a day (BID) | RESPIRATORY_TRACT | 6 refills | Status: DC
  Filled 2022-02-24: qty 10.2, 30d supply, fill #0

## 2022-02-24 NOTE — Progress Notes (Signed)
Assessment & Plan:  Sarah Cruz was seen today for prediabetes and medication refill.  Diagnoses and all orders for this visit:  Essential hypertension -     hydrochlorothiazide (HYDRODIURIL) 25 MG tablet; Take 1 tablet (25 mg total) by mouth daily. -     CMP14+EGFR Continue all antihypertensives as prescribed.  Reminded to bring in blood pressure log for follow  up appointment.  RECOMMENDATIONS: DASH/Mediterranean Diets are healthier choices for HTN.     Prediabetes -     POCT glucose (manual entry) -     POCT glycosylated hemoglobin (Hb A1C) -     CMP14+EGFR  Bronchitis -     predniSONE (DELTASONE) 20 MG tablet; Take 2 tablets (40 mg total) by mouth daily with breakfast for 5 days. -     albuterol (VENTOLIN HFA) 108 (90 Base) MCG/ACT inhaler; Inhale 2 puffs into the lungs every 6 (six) hours as needed. -     promethazine-dextromethorphan (PROMETHAZINE-DM) 6.25-15 MG/5ML syrup; Take 5 mLs by mouth 4 (four) times daily as needed for cough.  COPD GOLD II/ still smoking  -     albuterol (VENTOLIN HFA) 108 (90 Base) MCG/ACT inhaler; Inhale 2 puffs into the lungs every 6 (six) hours as needed. -     budesonide-formoterol (SYMBICORT) 80-4.5 MCG/ACT inhaler; Inhale 2 puffs into the lungs 2 (two) times daily. Continue to wean off cigarettes   Mixed hyperlipidemia -     atorvastatin (LIPITOR) 20 MG tablet; Take 1 tablet (20 mg total) by mouth daily. INSTRUCTIONS: Work on a low fat, heart healthy diet and participate in regular aerobic exercise program by working out at least 150 minutes per week; 5 days a week-30 minutes per day. Avoid red meat/beef/steak,  fried foods. junk foods, sodas, sugary drinks, unhealthy snacking, alcohol and smoking.  Drink at least 80 oz of water per day and monitor your carbohydrate intake daily.    Colon cancer screening -     Fecal occult blood, imunochemical([facility])    Patient has been counseled on age-appropriate routine health concerns for  screening and prevention. These are reviewed and up-to-date. Referrals have been placed accordingly. Immunizations are up-to-date or declined.    Subjective:   Chief Complaint  Patient presents with   Prediabetes   Medication Refill   HPI Sarah Cruz 57 y.o. female presents to office today for follow up to prediabetes.   A1c is now in prediabetes range. Weight is down a few lbs. Blood pressure is well controlled today. LDL at goal Lab Results  Component Value Date   HGBA1C 6.6 02/24/2022   BP Readings from Last 3 Encounters:  02/24/22 131/79  02/13/22 135/81  12/06/21 (!) 123/109     Lab Results  Component Value Date   LDLCALC 80 08/24/2021    Acute Bronchitis: Patient presents for presents evaluation of productive cough and congestion and wheezing . Symptoms began several days ago and are unchanged since that time.  Past history is significant for COPD.    Review of Systems  Constitutional:  Negative for fever, malaise/fatigue and weight loss.  HENT: Negative.  Negative for nosebleeds.   Eyes: Negative.  Negative for blurred vision, double vision and photophobia.  Respiratory:  Positive for cough, sputum production, shortness of breath and wheezing. Negative for hemoptysis.   Cardiovascular: Negative.  Negative for chest pain, palpitations and leg swelling.  Gastrointestinal: Negative.  Negative for heartburn, nausea and vomiting.  Musculoskeletal: Negative.  Negative for myalgias.  Neurological: Negative.  Negative for  dizziness, focal weakness, seizures and headaches.  Psychiatric/Behavioral: Negative.  Negative for suicidal ideas.     Past Medical History:  Diagnosis Date   Allergy    Blood transfusion without reported diagnosis    Childhood asthma    COPD (chronic obstructive pulmonary disease) ([place])    Hyperlipidemia    Hypertension    Sarcoidosis     Past Surgical History:  Procedure Laterality Date   ABDOMINAL HYSTERECTOMY     ENDOBRONCHIAL  ULTRASOUND Bilateral 12/07/2015   Procedure: ENDOBRONCHIAL ULTRASOUND;  Surgeon: Javier Glazier, MD;  Location: [facility];  Service: Cardiopulmonary;  Laterality: Bilateral;    Family History  Problem Relation Age of Onset   Heart failure Mother    Diabetes Mother    Hypertension Mother    Arthritis Mother    Brain cancer Sister    Breast cancer Maternal Aunt    Sickle cell anemia Other    Brain cancer Other    Asthma Other    Rheumatologic disease Neg Hx    Colon cancer Neg Hx    Esophageal cancer Neg Hx    Stomach cancer Neg Hx    Rectal cancer Neg Hx     Social History Reviewed with no changes to be made today.   Outpatient Medications Prior to Visit  Medication Sig Dispense Refill   atorvastatin (LIPITOR) 20 MG tablet Take 1 tablet (20 mg total) by mouth daily. 90 tablet 3   hydrochlorothiazide (HYDRODIURIL) 25 MG tablet Take 1 tablet (25 mg total) by mouth daily. 90 tablet 1   acetaminophen (TYLENOL) 500 MG tablet Take 1,000 mg by mouth 2 (two) times daily as needed for mild pain or headache.     benzonatate (TESSALON) 200 MG capsule Take 1 capsule (200 mg total) by mouth 3 (three) times daily as needed for cough. 20 capsule 0   lidocaine (LIDODERM) 5 % Place 1 patch onto the skin daily. Remove & Discard patch within 12 hours or as directed by MD 30 patch 0   methocarbamol (ROBAXIN) 500 MG tablet Take 1 tablet (500 mg total) by mouth 2 (two) times daily. 20 tablet 0   nicotine (NICODERM CQ - DOSED IN MG/24 HR) 7 mg/24hr patch Place 1 patch (7 mg total) onto the skin daily. 28 patch 3   phentermine (ADIPEX-P) 37.5 MG tablet Take 1 tablet (37.5 mg total) by mouth daily before breakfast. 30 tablet 1   albuterol (VENTOLIN HFA) 108 (90 Base) MCG/ACT inhaler Inhale 2 puffs into the lungs every 6 (six) hours as needed. 8.5 g 1   budesonide-formoterol (SYMBICORT) 80-4.5 MCG/ACT inhaler Inhale 2 puffs into the lungs 2 (two) times daily. 10.2 g 6   No facility-administered  medications prior to visit.    No Known Allergies     Objective:    BP 131/79   Pulse 90   Wt 148 lb 9.6 oz (67.4 kg)   SpO2 97%   BMI 24.73 kg/m  Wt Readings from Last 3 Encounters:  02/24/22 148 lb 9.6 oz (67.4 kg)  12/06/21 150 lb 11 oz (68.3 kg)  08/24/21 161 lb 3.2 oz (73.1 kg)    Physical Exam Vitals and nursing note reviewed.  Constitutional:      Appearance: She is well-developed.  HENT:     Head: Normocephalic and atraumatic.  Cardiovascular:     Rate and Rhythm: Normal rate and regular rhythm.     Heart sounds: Normal heart sounds. No murmur heard.    No friction  rub. No gallop.  Pulmonary:     Effort: Pulmonary effort is normal. No tachypnea or respiratory distress.     Breath sounds: Normal breath sounds. No decreased breath sounds, wheezing, rhonchi or rales.  Chest:     Chest wall: No tenderness.  Abdominal:     General: Bowel sounds are normal.     Palpations: Abdomen is soft.  Musculoskeletal:        General: Normal range of motion.     Cervical back: Normal range of motion.  Skin:    General: Skin is warm and dry.  Neurological:     Mental Status: She is alert and oriented to person, place, and time.     Coordination: Coordination normal.  Psychiatric:        Behavior: Behavior normal. Behavior is cooperative.        Thought Content: Thought content normal.        Judgment: Judgment normal.          Patient has been counseled extensively about nutrition and exercise as well as the importance of adherence with medications and regular follow-up. The patient was given clear instructions to go to ER or return to medical center if symptoms don't improve, worsen or new problems develop. The patient verbalized understanding.   Follow-up: Return in about 3 months (around 05/26/2022).   Gildardo Pounds, FNP-BC [facility] [place], [place]   03/02/2022, 7:39 PM

## 2022-02-25 LAB — CMP14+EGFR
ALT: 28 IU/L (ref 0–32)
AST: 23 IU/L (ref 0–40)
Albumin/Globulin Ratio: 1.5 (ref 1.2–2.2)
Albumin: 3.8 g/dL (ref 3.8–4.9)
Alkaline Phosphatase: 90 IU/L (ref 44–121)
BUN/Creatinine Ratio: 14 (ref 9–23)
BUN: 16 mg/dL (ref 6–24)
Bilirubin Total: 0.2 mg/dL (ref 0.0–1.2)
CO2: 28 mmol/L (ref 20–29)
Calcium: 9.6 mg/dL (ref 8.7–10.2)
Chloride: 102 mmol/L (ref 96–106)
Creatinine, Ser: 1.12 mg/dL — ABNORMAL HIGH (ref 0.57–1.00)
Globulin, Total: 2.6 g/dL (ref 1.5–4.5)
Glucose: 101 mg/dL — ABNORMAL HIGH (ref 70–99)
Potassium: 5 mmol/L (ref 3.5–5.2)
Sodium: 144 mmol/L (ref 134–144)
Total Protein: 6.4 g/dL (ref 6.0–8.5)
eGFR: 58 mL/min/{1.73_m2} — ABNORMAL LOW (ref >59)

## 2022-03-02 ENCOUNTER — Encounter: Payer: Self-pay | Admitting: Nurse Practitioner

## 2022-03-27 ENCOUNTER — Other Ambulatory Visit: Payer: Self-pay

## 2022-04-24 ENCOUNTER — Other Ambulatory Visit: Payer: Self-pay

## 2022-04-25 ENCOUNTER — Other Ambulatory Visit: Payer: Self-pay

## 2022-05-26 ENCOUNTER — Ambulatory Visit: Payer: Self-pay | Attending: Nurse Practitioner | Admitting: Nurse Practitioner

## 2022-05-26 ENCOUNTER — Other Ambulatory Visit: Payer: Self-pay

## 2022-05-26 ENCOUNTER — Encounter: Payer: Self-pay | Admitting: Nurse Practitioner

## 2022-05-26 VITALS — BP 125/72 | HR 67 | Ht 64.0 in | Wt 146.0 lb

## 2022-05-26 DIAGNOSIS — E782 Mixed hyperlipidemia: Secondary | ICD-10-CM

## 2022-05-26 DIAGNOSIS — J449 Chronic obstructive pulmonary disease, unspecified: Secondary | ICD-10-CM

## 2022-05-26 DIAGNOSIS — G5603 Carpal tunnel syndrome, bilateral upper limbs: Secondary | ICD-10-CM | POA: Insufficient documentation

## 2022-05-26 DIAGNOSIS — J40 Bronchitis, not specified as acute or chronic: Secondary | ICD-10-CM

## 2022-05-26 DIAGNOSIS — F17210 Nicotine dependence, cigarettes, uncomplicated: Secondary | ICD-10-CM

## 2022-05-26 DIAGNOSIS — D649 Anemia, unspecified: Secondary | ICD-10-CM

## 2022-05-26 DIAGNOSIS — R7303 Prediabetes: Secondary | ICD-10-CM

## 2022-05-26 DIAGNOSIS — Z1211 Encounter for screening for malignant neoplasm of colon: Secondary | ICD-10-CM

## 2022-05-26 DIAGNOSIS — F17200 Nicotine dependence, unspecified, uncomplicated: Secondary | ICD-10-CM

## 2022-05-26 DIAGNOSIS — E785 Hyperlipidemia, unspecified: Secondary | ICD-10-CM | POA: Insufficient documentation

## 2022-05-26 DIAGNOSIS — Z833 Family history of diabetes mellitus: Secondary | ICD-10-CM | POA: Insufficient documentation

## 2022-05-26 DIAGNOSIS — Z8249 Family history of ischemic heart disease and other diseases of the circulatory system: Secondary | ICD-10-CM | POA: Insufficient documentation

## 2022-05-26 DIAGNOSIS — Z79899 Other long term (current) drug therapy: Secondary | ICD-10-CM | POA: Insufficient documentation

## 2022-05-26 DIAGNOSIS — I10 Essential (primary) hypertension: Secondary | ICD-10-CM

## 2022-05-26 MED ORDER — NICOTINE 7 MG/24HR TD PT24
7.0000 mg | MEDICATED_PATCH | Freq: Every day | TRANSDERMAL | 3 refills | Status: AC
  Filled 2022-05-26: qty 28, 28d supply, fill #0

## 2022-05-26 MED ORDER — ATORVASTATIN CALCIUM 20 MG PO TABS
20.0000 mg | ORAL_TABLET | Freq: Every day | ORAL | 3 refills | Status: DC
  Filled 2022-05-26: qty 90, 90d supply, fill #0
  Filled 2022-08-22: qty 90, 90d supply, fill #1
  Filled 2022-11-17: qty 90, 90d supply, fill #2
  Filled 2023-02-19: qty 90, 90d supply, fill #3

## 2022-05-26 MED ORDER — METHOCARBAMOL 500 MG PO TABS
500.0000 mg | ORAL_TABLET | Freq: Two times a day (BID) | ORAL | 0 refills | Status: DC
  Filled 2022-05-26: qty 20, 10d supply, fill #0

## 2022-05-26 MED ORDER — ALBUTEROL SULFATE HFA 108 (90 BASE) MCG/ACT IN AERS
2.0000 | INHALATION_SPRAY | Freq: Four times a day (QID) | RESPIRATORY_TRACT | 1 refills | Status: DC | PRN
  Filled 2022-05-26 – 2023-02-20 (×2): qty 6.7, 25d supply, fill #0

## 2022-05-26 MED ORDER — HYDROCHLOROTHIAZIDE 25 MG PO TABS
25.0000 mg | ORAL_TABLET | Freq: Every day | ORAL | 1 refills | Status: DC
  Filled 2022-05-26: qty 30, 30d supply, fill #0
  Filled 2022-06-28: qty 30, 30d supply, fill #1
  Filled 2022-07-27: qty 90, 90d supply, fill #2
  Filled 2022-10-26: qty 30, 30d supply, fill #3

## 2022-05-26 MED ORDER — BUDESONIDE-FORMOTEROL FUMARATE 80-4.5 MCG/ACT IN AERO
2.0000 | INHALATION_SPRAY | Freq: Two times a day (BID) | RESPIRATORY_TRACT | 6 refills | Status: DC
  Filled 2022-05-26: qty 10.2, 30d supply, fill #0

## 2022-05-26 NOTE — Progress Notes (Unsigned)
Trouble gaining weight.

## 2022-05-26 NOTE — Progress Notes (Unsigned)
Assessment & Plan:  Sarah Cruz was seen today for hypertension.  Diagnoses and all orders for this visit:  Primary hypertension Continue all antihypertensives as prescribed.  Reminded to bring in blood pressure log for follow  up appointment.  RECOMMENDATIONS: DASH/Mediterranean Diets are healthier choices for HTN.   -     CMP14+EGFR -     hydrochlorothiazide (HYDRODIURIL) 25 MG tablet; Take 1 tablet (25 mg total) by mouth daily.  Prediabetes -     CMP14+EGFR -     Hemoglobin A1c  Colon cancer screening -     Fecal occult blood, imunochemical([facility])  Tobacco dependence -     CT CHEST LUNG CA SCREEN LOW DOSE W/O CM; Future -     nicotine (NICODERM CQ - DOSED IN MG/24 HR) 7 mg/24hr patch; Place 1 patch (7 mg total) onto the skin daily.  Anemia, unspecified type -     CBC with Differential  COPD GOLD II/ still smoking  Well controlled -     budesonide-formoterol (SYMBICORT) 80-4.5 MCG/ACT inhaler; Inhale 2 puffs into the lungs 2 (two) times daily. -     albuterol (VENTOLIN HFA) 108 (90 Base) MCG/ACT inhaler; Inhale 2 puffs into the lungs every 6 (six) hours as needed.  Mixed hyperlipidemia INSTRUCTIONS: Work on a low fat, heart healthy diet and participate in regular aerobic exercise program by working out at least 150 minutes per week; 5 days a week-30 minutes per day. Avoid red meat/beef/steak,  fried foods. junk foods, sodas, sugary drinks, unhealthy snacking, alcohol and smoking.  Drink at least 80 oz of water per day and monitor your carbohydrate intake daily.   -     atorvastatin (LIPITOR) 20 MG tablet; Take 1 tablet (20 mg total) by mouth daily.  Other orders -     methocarbamol (ROBAXIN) 500 MG tablet; Take 1 tablet (500 mg total) by mouth 2 (two) times daily. -     CBC with Differential/Platelet    Patient has been counseled on age-appropriate routine health concerns for screening and prevention. These are reviewed and up-to-date. Referrals have been placed  accordingly. Immunizations are up-to-date or declined.    Subjective:   Chief Complaint  Patient presents with   Hypertension   HPI Sarah Cruz 57 y.o. female presents to office today for HTN  Sarah Cruz 57 y.o. female presents to office today for follow up to Carpal Tunnel Syndrome. We had a televisit last month for her bilateral hand pain and paresthesias. Onset of the symptoms was several months ago. Current symptoms include tingling, pain and numbness in the bilateral hands. R>L. . 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 Patient's course of [id number] worsening. Evaluation to date: none. Treatment to date: OTC NSAIDs, CBD oils.   Pain is located in the right wrist(s), is described as aching, and is intermittent .  Associated symptoms include: decreased grip and occasional episodes of numbness and tingling.  The patient has tried tylenol  for pain relief and wrist wrap.  Related to injury:   No. She reports her occupation requires working with her hands. 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    Essential Hypertension Well controlled. Taking HCTZ 25 mg daily as prescribed. Denies chest pain, shortness of breath, palpitations, lightheadedness, dizziness, headaches or BLE edema.  BP Readings from Last 3 Encounters:  05/26/22 125/72  02/24/22 131/79  02/13/22 135/81     Prediabetes Well controlled. He is not taking any diabetic medications at this time.  Lab Results  Component Value Date   HGBA1C 6.2 (H) 05/26/2022  Lipids not at goal with atorvastatin 20 mg daily Lab Results  Component Value Date   LDLCALC 80 08/24/2021    Review of Systems  Constitutional:  Negative for fever, malaise/fatigue and weight loss.  HENT: Negative.  Negative for nosebleeds.   Eyes: Negative.  Negative for blurred vision,  double vision and photophobia.  Respiratory: Negative.  Negative for cough and shortness of breath.   Cardiovascular: Negative.  Negative for chest pain, palpitations and leg swelling.  Gastrointestinal: Negative.  Negative for heartburn, nausea and vomiting.  Musculoskeletal: Negative.  Negative for myalgias.  Neurological: Negative.  Negative for dizziness, focal weakness, seizures and headaches.  Psychiatric/Behavioral: Negative.  Negative for suicidal ideas.     Past Medical History:  Diagnosis Date   Allergy    Blood transfusion without reported diagnosis    Childhood asthma    COPD (chronic obstructive pulmonary disease)    Hyperlipidemia    Hypertension    Sarcoidosis     Past Surgical History:  Procedure Laterality Date   ABDOMINAL HYSTERECTOMY     ENDOBRONCHIAL ULTRASOUND Bilateral 12/07/2015   Procedure: ENDOBRONCHIAL ULTRASOUND;  Surgeon: Roslynn Amble, MD;  Location: [facility];  Service: Cardiopulmonary;  Laterality: Bilateral;    Family History  Problem Relation Age of Onset   Heart failure Mother    Diabetes Mother    Hypertension Mother    Arthritis Mother    Brain cancer Sister    Breast cancer Maternal Aunt    Sickle cell anemia Other    Brain cancer Other    Asthma Other    Rheumatologic disease Neg Hx    Colon cancer Neg Hx    Esophageal cancer Neg Hx    Stomach cancer Neg Hx    Rectal cancer Neg Hx     Social History Reviewed with no changes to be made today.   Outpatient Medications Prior to Visit  Medication Sig Dispense Refill   acetaminophen (TYLENOL) 500 MG tablet Take 1,000 mg by mouth 2 (two) times daily as needed for mild pain or headache.     albuterol (VENTOLIN HFA) 108 (90 Base) MCG/ACT inhaler Inhale 2 puffs into the lungs every 6 (six) hours as needed. 6.7 g 1   atorvastatin (LIPITOR) 20 MG tablet Take 1 tablet (20 mg total) by mouth daily. 90 tablet 3   budesonide-formoterol (SYMBICORT) 80-4.5 MCG/ACT inhaler Inhale 2 puffs  into the lungs 2 (two) times daily. 10.2 g 6   hydrochlorothiazide (HYDRODIURIL) 25 MG tablet Take 1 tablet (25 mg total) by mouth daily. 90 tablet 1   methocarbamol (ROBAXIN) 500 MG tablet Take 1 tablet (500 mg total) by mouth 2 (two) times daily. 20 tablet 0   nicotine (NICODERM CQ - DOSED IN MG/24 HR) 7 mg/24hr patch Place 1 patch (7 mg total) onto the skin daily. 28 patch 3   phentermine (ADIPEX-P) 37.5 MG tablet Take 1 tablet (37.5 mg total) by mouth daily before breakfast. 30 tablet 1   benzonatate (TESSALON) 200 MG capsule Take 1 capsule (200 mg total) by mouth 3 (three) times daily as needed for cough. (Patient not taking: Reported on 05/26/2022) 20 capsule 0   lidocaine (LIDODERM) 5 % Place 1 patch onto the skin daily. Remove & Discard patch within 12 hours or as directed by MD (Patient  not taking: Reported on 05/26/2022) 30 patch 0   promethazine-dextromethorphan (PROMETHAZINE-DM) 6.25-15 MG/5ML syrup Take 5 mLs by mouth 4 (four) times daily as needed for cough. (Patient not taking: Reported on 05/26/2022) 240 mL 0   No facility-administered medications prior to visit.    No Known Allergies     Objective:    BP 125/72   Pulse 67   Ht 5\' 4"  (1.626 m)   Wt 146 lb (66.2 kg) Comment: clothed  SpO2 99%   BMI 25.06 kg/m  Wt Readings from Last 3 Encounters:  05/26/22 146 lb (66.2 kg)  02/24/22 148 lb 9.6 oz (67.4 kg)  12/06/21 150 lb 11 oz (68.3 kg)    Physical Exam Vitals and nursing note reviewed.  Constitutional:      Appearance: She is well-developed.  HENT:     Head: Normocephalic and atraumatic.  Cardiovascular:     Rate and Rhythm: Normal rate and regular rhythm.     Heart sounds: Normal heart sounds. No murmur heard.    No friction rub. No gallop.  Pulmonary:     Effort: Pulmonary effort is normal. No tachypnea or respiratory distress.     Breath sounds: Normal breath sounds. No decreased breath sounds, wheezing, rhonchi or rales.  Chest:     Chest wall: No  tenderness.  Abdominal:     General: Bowel sounds are normal.     Palpations: Abdomen is soft.  Musculoskeletal:        General: Normal range of motion.     Cervical back: Normal range of motion.  Skin:    General: Skin is warm and dry.  Neurological:     Mental Status: She is alert and oriented to person, place, and time.     Coordination: Coordination normal.  Psychiatric:        Behavior: Behavior normal. Behavior is cooperative.        Thought Content: Thought content normal.        Judgment: Judgment normal.          Patient has been counseled extensively about nutrition and exercise as well as the importance of adherence with medications and regular follow-up. The patient was given clear instructions to go to ER or return to medical center if symptoms don't improve, worsen or new problems develop. The patient verbalized understanding.   Follow-up: Return in about 3 months (around 08/25/2022).   Claiborne Rigg, FNP-BC [facility] [place], [place] [phone]   05/27/2022, 10:02 PM

## 2022-05-27 ENCOUNTER — Encounter: Payer: Self-pay | Admitting: Nurse Practitioner

## 2022-05-27 LAB — CBC WITH DIFFERENTIAL/PLATELET
Basophils Absolute: 0.1 10*3/uL (ref 0.0–0.2)
Basos: 1 %
EOS (ABSOLUTE): 0.3 10*3/uL (ref 0.0–0.4)
Eos: 5 %
Hematocrit: 41.6 % (ref 34.0–46.6)
Hemoglobin: 13.3 g/dL (ref 11.1–15.9)
Immature Grans (Abs): 0 10*3/uL (ref 0.0–0.1)
Immature Granulocytes: 0 %
Lymphocytes Absolute: 2 10*3/uL (ref 0.7–3.1)
Lymphs: 35 %
MCH: 23.5 pg — ABNORMAL LOW (ref 26.6–33.0)
MCHC: 32 g/dL (ref 31.5–35.7)
MCV: 74 fL — ABNORMAL LOW (ref 79–97)
Monocytes Absolute: 0.6 10*3/uL (ref 0.1–0.9)
Monocytes: 10 %
Neutrophils Absolute: 2.7 10*3/uL (ref 1.4–7.0)
Neutrophils: 49 %
Platelets: 352 10*3/uL (ref 150–450)
RBC: 5.66 x10E6/uL — ABNORMAL HIGH (ref 3.77–5.28)
RDW: 14.3 % (ref 11.7–15.4)
WBC: 5.7 10*3/uL (ref 3.4–10.8)

## 2022-05-27 LAB — HEMOGLOBIN A1C
Est. average glucose Bld gHb Est-mCnc: 131 mg/dL
Hgb A1c MFr Bld: 6.2 % — ABNORMAL HIGH (ref 4.8–5.6)

## 2022-05-27 LAB — CMP14+EGFR
ALT: 20 IU/L (ref 0–32)
AST: 19 IU/L (ref 0–40)
Albumin/Globulin Ratio: 1.6 (ref 1.2–2.2)
Albumin: 4.6 g/dL (ref 3.8–4.9)
Alkaline Phosphatase: 74 IU/L (ref 44–121)
BUN/Creatinine Ratio: 15 (ref 9–23)
BUN: 16 mg/dL (ref 6–24)
Bilirubin Total: 0.3 mg/dL (ref 0.0–1.2)
CO2: 26 mmol/L (ref 20–29)
Calcium: 10.3 mg/dL — ABNORMAL HIGH (ref 8.7–10.2)
Chloride: 97 mmol/L (ref 96–106)
Creatinine, Ser: 1.05 mg/dL — ABNORMAL HIGH (ref 0.57–1.00)
Globulin, Total: 2.8 g/dL (ref 1.5–4.5)
Glucose: 114 mg/dL — ABNORMAL HIGH (ref 70–99)
Potassium: 4.5 mmol/L (ref 3.5–5.2)
Sodium: 137 mmol/L (ref 134–144)
Total Protein: 7.4 g/dL (ref 6.0–8.5)
eGFR: 62 mL/min/{1.73_m2} (ref >59)

## 2022-06-29 ENCOUNTER — Other Ambulatory Visit: Payer: Self-pay

## 2022-06-30 ENCOUNTER — Other Ambulatory Visit: Payer: Self-pay

## 2022-07-27 ENCOUNTER — Other Ambulatory Visit: Payer: Self-pay

## 2022-07-28 ENCOUNTER — Other Ambulatory Visit: Payer: Self-pay

## 2022-08-22 ENCOUNTER — Other Ambulatory Visit: Payer: Self-pay

## 2022-08-23 ENCOUNTER — Other Ambulatory Visit: Payer: Self-pay

## 2022-09-01 ENCOUNTER — Encounter: Payer: Self-pay | Admitting: Nurse Practitioner

## 2022-09-01 ENCOUNTER — Ambulatory Visit: Payer: BC Managed Care – PPO | Attending: Nurse Practitioner | Admitting: Nurse Practitioner

## 2022-09-01 ENCOUNTER — Other Ambulatory Visit: Payer: Self-pay

## 2022-09-01 VITALS — BP 122/77 | HR 70 | Ht 64.0 in | Wt 145.6 lb

## 2022-09-01 DIAGNOSIS — I10 Essential (primary) hypertension: Secondary | ICD-10-CM | POA: Diagnosis not present

## 2022-09-01 DIAGNOSIS — G5603 Carpal tunnel syndrome, bilateral upper limbs: Secondary | ICD-10-CM | POA: Diagnosis not present

## 2022-09-01 MED ORDER — IBUPROFEN 600 MG PO TABS
600.0000 mg | ORAL_TABLET | Freq: Three times a day (TID) | ORAL | 1 refills | Status: AC | PRN
  Filled 2022-09-01 (×2): qty 60, 20d supply, fill #0

## 2022-09-01 NOTE — Progress Notes (Signed)
Assessment & Plan:  Sarah Cruz was seen today for hypertension and hand pain.  Diagnoses and all orders for this visit:  Primary hypertension Continue all antihypertensives as prescribed.  Reminded to bring in blood pressure log for follow  up appointment.  RECOMMENDATIONS: DASH/Mediterranean Diets are healthier choices for HTN.    Bilateral carpal tunnel syndrome -     ibuprofen (ADVIL) 600 MG tablet; Take 1 tablet (600 mg total) by mouth every 8 (eight) hours as needed.    Patient has been counseled on age-appropriate routine health concerns for screening and prevention. These are reviewed and up-to-date. Referrals have been placed accordingly. Immunizations are up-to-date or declined.    Subjective:   Chief Complaint  Patient presents with   Hypertension   Hand Pain   HPI Sarah Cruz 57 y.o. female presents to office today for follow up to HTN  She has a past medical history of Allergy, Blood transfusion without reported diagnosis, Childhood asthma, COPD, tobacco use disorder, Hyperlipidemia, Hypertension, and Sarcoidosis.   Blood pressure is well controlled today. She is taking hydrochlorothiazide 25 mg daily as prescribed.  BP Readings from Last 3 Encounters:  09/01/22 122/77  05/26/22 125/72  02/24/22 131/79    She has a history of bilateral hand pain and paresthesias. Onset of the symptoms was several years ago. Current symptoms include tingling, swelling, pain and numbness in the bilateral hands. R>L. . 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 Patient's course of [id number] worsening. Evaluation to date: ortho referral, xrays. Treatment to date: OTC NSAIDs, CBD oils.   Review of Systems  Constitutional:  Negative for fever, malaise/fatigue and weight loss.  HENT: Negative.  Negative for nosebleeds.   Eyes: Negative.  Negative for  blurred vision, double vision and photophobia.  Respiratory: Negative.  Negative for cough and shortness of breath.   Cardiovascular: Negative.  Negative for chest pain, palpitations and leg swelling.  Gastrointestinal: Negative.  Negative for heartburn, nausea and vomiting.  Musculoskeletal:  Positive for joint pain. Negative for myalgias.  Neurological: Negative.  Negative for dizziness, focal weakness, seizures and headaches.  Psychiatric/Behavioral: Negative.  Negative for suicidal ideas.     Past Medical History:  Diagnosis Date   Allergy    Blood transfusion without reported diagnosis    Childhood asthma    COPD (chronic obstructive pulmonary disease) ([facility])    Hyperlipidemia    Hypertension    Sarcoidosis     Past Surgical History:  Procedure Laterality Date   ABDOMINAL HYSTERECTOMY     ENDOBRONCHIAL ULTRASOUND Bilateral 12/07/2015   Procedure: ENDOBRONCHIAL ULTRASOUND;  Surgeon: Roslynn Amble, MD;  Location: [facility];  Service: Cardiopulmonary;  Laterality: Bilateral;    Family History  Problem Relation Age of Onset   Heart failure Mother    Diabetes Mother    Hypertension Mother    Arthritis Mother    Brain cancer Sister    Breast cancer Maternal Aunt    Sickle cell anemia Other    Brain cancer Other    Asthma Other    Rheumatologic disease Neg Hx    Colon cancer Neg Hx    Esophageal cancer Neg Hx    Stomach cancer Neg Hx    Rectal cancer Neg Hx     Social History Reviewed with no changes to be made today.   Outpatient Medications Prior to Visit  Medication Sig Dispense Refill   atorvastatin (LIPITOR) 20  MG tablet Take 1 tablet (20 mg total) by mouth daily. 90 tablet 3   budesonide-formoterol (SYMBICORT) 80-4.5 MCG/ACT inhaler Inhale 2 puffs into the lungs 2 (two) times daily. 10.2 g 6   hydrochlorothiazide (HYDRODIURIL) 25 MG tablet Take 1 tablet (25 mg total) by mouth daily. 90 tablet 1   acetaminophen (TYLENOL) 500 MG tablet Take 1,000 mg by  mouth 2 (two) times daily as needed for mild pain or headache. (Patient not taking: Reported on 09/01/2022)     albuterol (VENTOLIN HFA) 108 (90 Base) MCG/ACT inhaler Inhale 2 puffs into the lungs every 6 (six) hours as needed. (Patient not taking: Reported on 09/01/2022) 6.7 g 1   nicotine (NICODERM CQ - DOSED IN MG/24 HR) 7 mg/24hr patch Place 1 patch (7 mg total) onto the skin daily. (Patient not taking: Reported on 09/01/2022) 28 patch 3   methocarbamol (ROBAXIN) 500 MG tablet Take 1 tablet (500 mg total) by mouth 2 (two) times daily. (Patient not taking: Reported on 09/01/2022) 20 tablet 0   No facility-administered medications prior to visit.    No Known Allergies     Objective:    BP 122/77 (BP Location: Left Arm, Patient Position: Sitting, Cuff Size: Normal)   Pulse 70   Ht 5\' 4"  (1.626 m)   Wt 145 lb 9.6 oz (66 kg) Comment: Clothed  SpO2 99%   BMI 24.99 kg/m  Wt Readings from Last 3 Encounters:  09/01/22 145 lb 9.6 oz (66 kg)  05/26/22 146 lb (66.2 kg)  02/24/22 148 lb 9.6 oz (67.4 kg)    Physical Exam Vitals and nursing note reviewed.  Constitutional:      Appearance: She is well-developed.  HENT:     Head: Normocephalic and atraumatic.  Cardiovascular:     Rate and Rhythm: Normal rate and regular rhythm.     Heart sounds: Normal heart sounds. No murmur heard.    No friction rub. No gallop.  Pulmonary:     Effort: Pulmonary effort is normal. No tachypnea or respiratory distress.     Breath sounds: Normal breath sounds. No decreased breath sounds, wheezing, rhonchi or rales.  Chest:     Chest wall: No tenderness.  Abdominal:     General: Bowel sounds are normal.     Palpations: Abdomen is soft.  Musculoskeletal:        General: Normal range of motion.     Right hand: Swelling present.     Left hand: Swelling present.       Arms:     Cervical back: Normal range of motion.  Skin:    General: Skin is warm and dry.  Neurological:     Mental Status: She is alert  and oriented to person, place, and time.     Coordination: Coordination normal.  Psychiatric:        Behavior: Behavior normal. Behavior is cooperative.        Thought Content: Thought content normal.        Judgment: Judgment normal.          Patient has been counseled extensively about nutrition and exercise as well as the importance of adherence with medications and regular follow-up. The patient was given clear instructions to go to ER or return to medical center if symptoms don't improve, worsen or new problems develop. The patient verbalized understanding.   Follow-up: Return in about 3 months (around 12/02/2022).   Claiborne Rigg, FNP-BC [facility] [place], [place]  [phone]   09/01/2022, 11:44 AM

## 2022-09-05 ENCOUNTER — Other Ambulatory Visit: Payer: Self-pay

## 2022-09-06 ENCOUNTER — Other Ambulatory Visit: Payer: Self-pay

## 2022-09-19 ENCOUNTER — Ambulatory Visit (HOSPITAL_COMMUNITY)
Admission: EM | Admit: 2022-09-19 | Discharge: 2022-09-19 | Disposition: A | Discharge status: Home / Self Care | Payer: BC Managed Care – PPO | Attending: Physician Assistant | Admitting: Physician Assistant

## 2022-09-19 ENCOUNTER — Other Ambulatory Visit: Payer: Self-pay

## 2022-09-19 ENCOUNTER — Encounter (HOSPITAL_COMMUNITY): Payer: Self-pay

## 2022-09-19 DIAGNOSIS — U071 COVID-19: Secondary | ICD-10-CM | POA: Insufficient documentation

## 2022-09-19 DIAGNOSIS — J441 Chronic obstructive pulmonary disease with (acute) exacerbation: Secondary | ICD-10-CM | POA: Diagnosis present

## 2022-09-19 DIAGNOSIS — R051 Acute cough: Secondary | ICD-10-CM | POA: Diagnosis present

## 2022-09-19 LAB — SARS CORONAVIRUS 2 (TAT 6-24 HRS): SARS Coronavirus 2: POSITIVE — AB

## 2022-09-19 MED ORDER — IPRATROPIUM-ALBUTEROL 0.5-2.5 (3) MG/3ML IN SOLN
RESPIRATORY_TRACT | Status: AC
  Filled 2022-09-19: qty 3

## 2022-09-19 MED ORDER — METHYLPREDNISOLONE SODIUM SUCC 125 MG IJ SOLR
80.0000 mg | Freq: Once | INTRAMUSCULAR | Status: AC
  Administered 2022-09-19: 80 mg via INTRAMUSCULAR

## 2022-09-19 MED ORDER — METHYLPREDNISOLONE SODIUM SUCC 125 MG IJ SOLR
INTRAMUSCULAR | Status: AC
  Filled 2022-09-19: qty 2

## 2022-09-19 MED ORDER — AMOXICILLIN-POT CLAVULANATE 875-125 MG PO TABS
1.0000 | ORAL_TABLET | Freq: Two times a day (BID) | ORAL | 0 refills | Status: DC
  Filled 2022-09-19: qty 14, 7d supply, fill #0

## 2022-09-19 MED ORDER — PREDNISONE 20 MG PO TABS
40.0000 mg | ORAL_TABLET | Freq: Every day | ORAL | 0 refills | Status: AC
  Filled 2022-09-19: qty 10, 5d supply, fill #0

## 2022-09-19 MED ORDER — IPRATROPIUM-ALBUTEROL 0.5-2.5 (3) MG/3ML IN SOLN
3.0000 mL | Freq: Once | RESPIRATORY_TRACT | Status: AC
  Administered 2022-09-19: 3 mL via RESPIRATORY_TRACT

## 2022-09-19 NOTE — Discharge Instructions (Signed)
I believe the had a virus that triggered your COPD.  I am glad you are feeling better after the medication.  Start prednisone tomorrow (09/20/2022).  Do not take NSAIDs with this medication including aspirin, ibuprofen/Advil, naproxen/Aleve.  Start Augmentin twice daily.  Continue your albuterol and Symbicort.  Make sure that you are resting and drinking plenty of fluid.  If your symptoms are not improving within a week please return for reevaluation.  If anything worsens please be seen immediately.  We will contact you if you are positive for COVID.

## 2022-09-19 NOTE — ED Provider Notes (Signed)
[facility]    CSN: [id number] Arrival date & time: 09/19/22  2130      History   Chief Complaint Chief Complaint  Patient presents with   Cough    HPI Sarah Cruz is a 57 y.o. female.   Patient presents today with a 4-day history of URI symptoms including cough, congestion, wheezing.  Denies any chest pain, fever, nausea, vomiting, diarrhea, body aches.  She has not tried any over-the-counter medication for symptom management.  She has had COVID several years ago.  She has had COVID vaccines.  She denies any recent antibiotics or steroids.  She does have a history of COPD managed with as needed albuterol and Symbicort.  She reports compliance with these medications but has not used her rescue inhaler since symptoms began.  She is still a smoker.  Denies history of diabetes.    Past Medical History:  Diagnosis Date   Allergy    Blood transfusion without reported diagnosis    Childhood asthma    COPD (chronic obstructive pulmonary disease) (HCC)    Hyperlipidemia    Hypertension    Sarcoidosis     Patient Active Problem List   Diagnosis Date Noted   Primary osteoarthritis of first carpometacarpal joint of right hand 11/25/2019   Primary osteoarthritis of first carpometacarpal joint of left hand 11/25/2019   Cigarette smoker 06/18/2018   PAD (peripheral artery disease) (HCC) 04/09/2018   Sarcoidosis 12/29/2015   COPD GOLD II/ still smoking  12/29/2015   Essential hypertension 10/22/2015   Childhood asthma 10/22/2015   Dyspnea 10/22/2015   Exposure to TB 10/22/2015   Multiple lung nodules on CT 10/16/2015   Mediastinal adenopathy 10/16/2015   Mild intermittent asthma without complication 07/08/2014   Cigarette nicotine dependence without complication 07/08/2014    Past Surgical History:  Procedure Laterality Date   ABDOMINAL HYSTERECTOMY     ENDOBRONCHIAL ULTRASOUND Bilateral 12/07/2015   Procedure: ENDOBRONCHIAL ULTRASOUND;  Surgeon: Roslynn Amble, MD;  Location: [facility];  Service: Cardiopulmonary;  Laterality: Bilateral;    OB History   No obstetric history on file.      Home Medications    Prior to Admission medications   Medication Sig Start Date End Date Taking? Authorizing Provider  amoxicillin-clavulanate (AUGMENTIN) 875-125 MG tablet Take 1 tablet by mouth every 12 (twelve) hours. 09/19/22  Yes Soul Deveney K, PA-C  predniSONE (DELTASONE) 20 MG tablet Take 2 tablets (40 mg total) by mouth daily for 5 days. 09/19/22 09/24/22 Yes Ashelynn Marks, Noberto Retort, PA-C  acetaminophen (TYLENOL) 500 MG tablet Take 1,000 mg by mouth 2 (two) times daily as needed for mild pain or headache. Patient not taking: Reported on 09/01/2022    [provider]  albuterol (VENTOLIN HFA) 108 (90 Base) MCG/ACT inhaler Inhale 2 puffs into the lungs every 6 (six) hours as needed. Patient not taking: Reported on 09/01/2022 05/26/22   Claiborne Rigg, NP  atorvastatin (LIPITOR) 20 MG tablet Take 1 tablet (20 mg total) by mouth daily. 05/26/22   Claiborne Rigg, NP  budesonide-formoterol (SYMBICORT) 80-4.5 MCG/ACT inhaler Inhale 2 puffs into the lungs 2 (two) times daily. 05/26/22   Claiborne Rigg, NP  hydrochlorothiazide (HYDRODIURIL) 25 MG tablet Take 1 tablet (25 mg total) by mouth daily. 05/26/22   Claiborne Rigg, NP  ibuprofen (ADVIL) 600 MG tablet Take 1 tablet (600 mg total) by mouth every 8 (eight) hours as needed. 09/01/22   Claiborne Rigg, NP  nicotine (NICODERM  CQ - DOSED IN MG/24 HR) 7 mg/24hr patch Place 1 patch (7 mg total) onto the skin daily. Patient not taking: Reported on 09/01/2022 05/26/22   Claiborne Rigg, NP    Family History Family History  Problem Relation Age of Onset   Heart failure Mother    Diabetes Mother    Hypertension Mother    Arthritis Mother    Brain cancer Sister    Breast cancer Maternal Aunt    Sickle cell anemia Other    Brain cancer Other    Asthma Other    Rheumatologic disease Neg Hx    Colon  cancer Neg Hx    Esophageal cancer Neg Hx    Stomach cancer Neg Hx    Rectal cancer Neg Hx     Social History Social History   Tobacco Use   Smoking status: Every Day    Current packs/day: 0.50    Average packs/day: 0.5 packs/day for 45.4 years (22.7 ttl pk-yrs)    Types: Cigarettes    Start date: 04/08/1977   Smokeless tobacco: Never   Tobacco comments:    down to 0.5ppd  Vaping Use   Vaping status: Never Used  Substance Use Topics   Alcohol use: No    Comment: Remote EtOH   Drug use: No    Comment: 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   Review of Systems Review of Systems  Constitutional:  Positive for activity change. Negative for appetite change, fatigue and fever.  HENT:  Positive for congestion and sore throat. Negative for sinus pressure and sneezing.   Respiratory:  Positive for cough and wheezing. Negative for shortness of breath.   Cardiovascular:  Negative for chest pain.  Gastrointestinal:  Negative for abdominal pain, diarrhea, nausea and vomiting.  Neurological:  Negative for dizziness, light-headedness and headaches.     Physical Exam Triage Vital Signs ED Triage Vitals [09/19/22 1111]  Encounter Vitals Group     BP 137/76     Systolic BP Percentile      Diastolic BP Percentile      Pulse Rate 71     Resp 18     Temp 98.7 F (37.1 C)     Temp Source Oral     SpO2 96 %     Weight      Height      Head Circumference      Peak Flow      Pain Score 0     Pain Loc      Pain Education      Exclude from Growth Chart    No data found.  Updated Vital Signs BP 137/76 (BP Location: Left Arm)   Pulse 71   Temp 98.7 F (37.1 C) (Oral)   Resp 18   SpO2 96%   Visual Acuity Right Eye Distance:   Left Eye Distance:   Bilateral Distance:    Right Eye Near:   Left Eye Near:    Bilateral Near:     Physical Exam Vitals reviewed.  Constitutional:      General: She is awake. She is not in acute  distress.    Appearance: Normal appearance. She is well-developed. She is not ill-appearing.     Comments: Very pleasant female appears stated age in no acute distress sitting comfortably in exam room  HENT:     Head: Normocephalic and atraumatic.     Right Ear:  External ear normal. There is impacted cerumen.     Left Ear: Tympanic membrane, ear canal and external ear normal. Tympanic membrane is not erythematous or bulging.     Nose:     Right Sinus: No maxillary sinus tenderness or frontal sinus tenderness.     Left Sinus: No maxillary sinus tenderness or frontal sinus tenderness.     Mouth/Throat:     Pharynx: Uvula midline. No oropharyngeal exudate or posterior oropharyngeal erythema.  Cardiovascular:     Rate and Rhythm: Normal rate and regular rhythm.     Heart sounds: Normal heart sounds, S1 normal and S2 normal. No murmur heard. Pulmonary:     Effort: Pulmonary effort is normal.     Breath sounds: Wheezing present. No rhonchi or rales.  Psychiatric:        Behavior: Behavior is cooperative.      UC Treatments / Results  Labs (all labs ordered are listed, but only abnormal results are displayed) Labs Reviewed  SARS CORONAVIRUS 2 (TAT 6-24 HRS)    EKG   Radiology No results found.  Procedures Procedures (including critical care time)  Medications Ordered in UC Medications  ipratropium-albuterol (DUONEB) 0.5-2.5 (3) MG/3ML nebulizer solution 3 mL (3 mLs Nebulization Given 09/19/22 1151)  methylPREDNISolone sodium succinate (SOLU-MEDROL) 125 mg/2 mL injection 80 mg (80 mg Intramuscular Given 09/19/22 1155)    Initial Impression / Assessment and Plan / UC Course  I have reviewed the triage vital signs and the nursing notes.  Pertinent labs & imaging results that were available during my care of the patient were reviewed by me and considered in my medical decision making (see chart for details).     Patient is well-appearing, afebrile, nontoxic, nontachycardic.   Concern for COPD exacerbation given clinical presentation.  Patient was given DuoNeb and Solu-Medrol in clinic with improvement of wheezing and cough symptoms.  We discussed that her COPD exacerbation was likely triggered by a virus or allergies.  She was tested for COVID.  We discussed that she is a candidate for Paxlovid but she was hesitant to start this medication due to concern for side effects.  Will treat for COPD exacerbation she was started on Augmentin twice daily for 7 days.  Will start prednisone burst of 40 mg for 5 days beginning tomorrow since she was given Solu-Medrol in clinic today (09/20/2022).  She can use over-the-counter medications including Mucinex, Flonase, Tylenol.  She is to continue her maintenance and rescue medication as previously prescribed; she did not require refills of these medicines.  Recommend close follow-up with her primary care.  We discussed that if she has any worsening or changing symptoms including worsening cough, shortness of breath, fever, nausea/vomiting, weakness she needs to be seen immediately.  Strict return precautions given.  Work excuse note provided.  Final Clinical Impressions(s) / UC Diagnoses   Final diagnoses:  COPD exacerbation (HCC)  Acute cough     Discharge Instructions      I believe the had a virus that triggered your COPD.  I am glad you are feeling better after the medication.  Start prednisone tomorrow (09/20/2022).  Do not take NSAIDs with this medication including aspirin, ibuprofen/Advil, naproxen/Aleve.  Start Augmentin twice daily.  Continue your albuterol and Symbicort.  Make sure that you are resting and drinking plenty of fluid.  If your symptoms are not improving within a week please return for reevaluation.  If anything worsens please be seen immediately.  We will contact you if you  are positive for COVID.     ED Prescriptions     Medication Sig Dispense Auth. Provider   predniSONE (DELTASONE) 20 MG tablet Take 2 tablets  (40 mg total) by mouth daily for 5 days. 10 tablet Fani Rotondo K, PA-C   amoxicillin-clavulanate (AUGMENTIN) 875-125 MG tablet Take 1 tablet by mouth every 12 (twelve) hours. 14 tablet Kanyon Seibold, Noberto Retort, PA-C      PDMP not reviewed this encounter.   Jeani Hawking, PA-C 09/19/22 1207

## 2022-09-19 NOTE — ED Triage Notes (Signed)
Pt c/o cough and congestion since Saturday. Denies taking any meds. Requesting COVID testing.

## 2022-10-26 ENCOUNTER — Other Ambulatory Visit: Payer: Self-pay | Admitting: Nurse Practitioner

## 2022-10-26 ENCOUNTER — Other Ambulatory Visit: Payer: Self-pay

## 2022-10-26 DIAGNOSIS — I10 Essential (primary) hypertension: Secondary | ICD-10-CM

## 2022-10-26 MED ORDER — HYDROCHLOROTHIAZIDE 25 MG PO TABS
25.0000 mg | ORAL_TABLET | Freq: Every day | ORAL | 1 refills | Status: DC
  Filled 2022-10-26 – 2022-11-17 (×2): qty 90, 90d supply, fill #0
  Filled 2023-02-19: qty 90, 90d supply, fill #1

## 2022-11-17 ENCOUNTER — Other Ambulatory Visit: Payer: Self-pay

## 2022-12-08 ENCOUNTER — Other Ambulatory Visit: Payer: Self-pay

## 2022-12-08 ENCOUNTER — Other Ambulatory Visit (HOSPITAL_COMMUNITY)
Admission: RE | Admit: 2022-12-08 | Discharge: 2022-12-08 | Disposition: A | Discharge status: Home / Self Care | Payer: BC Managed Care – PPO | Source: Ambulatory Visit | Attending: Nurse Practitioner | Admitting: Nurse Practitioner

## 2022-12-08 ENCOUNTER — Ambulatory Visit: Payer: BC Managed Care – PPO | Attending: Nurse Practitioner | Admitting: Nurse Practitioner

## 2022-12-08 ENCOUNTER — Encounter: Payer: Self-pay | Admitting: Nurse Practitioner

## 2022-12-08 VITALS — BP 136/86 | HR 67 | Ht 64.0 in | Wt 155.2 lb

## 2022-12-08 DIAGNOSIS — I10 Essential (primary) hypertension: Secondary | ICD-10-CM | POA: Diagnosis not present

## 2022-12-08 DIAGNOSIS — N760 Acute vaginitis: Secondary | ICD-10-CM

## 2022-12-08 DIAGNOSIS — L292 Pruritus vulvae: Secondary | ICD-10-CM | POA: Insufficient documentation

## 2022-12-08 DIAGNOSIS — J449 Chronic obstructive pulmonary disease, unspecified: Secondary | ICD-10-CM

## 2022-12-08 DIAGNOSIS — B9689 Other specified bacterial agents as the cause of diseases classified elsewhere: Secondary | ICD-10-CM | POA: Diagnosis not present

## 2022-12-08 DIAGNOSIS — R7303 Prediabetes: Secondary | ICD-10-CM | POA: Diagnosis not present

## 2022-12-08 MED ORDER — BUDESONIDE-FORMOTEROL FUMARATE 80-4.5 MCG/ACT IN AERO
2.0000 | INHALATION_SPRAY | Freq: Two times a day (BID) | RESPIRATORY_TRACT | 6 refills | Status: DC
  Filled 2022-12-08: qty 10.2, 30d supply, fill #0

## 2022-12-08 MED ORDER — FLUCONAZOLE 150 MG PO TABS
150.0000 mg | ORAL_TABLET | Freq: Once | ORAL | 0 refills | Status: AC
  Filled 2022-12-08: qty 1, 1d supply, fill #0

## 2022-12-08 MED ORDER — BUDESONIDE-FORMOTEROL FUMARATE 80-4.5 MCG/ACT IN AERO
2.0000 | INHALATION_SPRAY | Freq: Two times a day (BID) | RESPIRATORY_TRACT | 6 refills | Status: DC
  Filled 2022-12-08 – 2023-02-20 (×2): qty 10.2, 30d supply, fill #0

## 2022-12-08 NOTE — Progress Notes (Signed)
Assessment & Plan:  Sarah Cruz was seen today for medical management of chronic issues and vaginal itching.  Diagnoses and all orders for this visit:  Primary hypertension Blood pressure at goal of <130/80 -     CMP14+EGFR Continue all antihypertensives as prescribed.  Reminded to bring in blood pressure log for follow  up appointment.  RECOMMENDATIONS: DASH/Mediterranean Diets are healthier choices for HTN.    Acute vaginitis -     Cervicovaginal ancillary only -     fluconazole (DIFLUCAN) 150 MG tablet; Take 1 tablet (150 mg total) by mouth once for 1 dose.  Prediabetes Well controlled with diet and weight mgmt only at this time. -     Hemoglobin A1c  COPD GOLD II/ still smoking  -     budesonide-formoterol (SYMBICORT) 80-4.5 MCG/ACT inhaler; Inhale 2 puffs into the lungs 2 (two) times daily.    Patient has been counseled on age-appropriate routine health concerns for screening and prevention. These are reviewed and up-to-date. Referrals have been placed accordingly. Immunizations are up-to-date or declined.    Subjective:   Chief Complaint  Patient presents with   Medical Management of Chronic Issues   Vaginal Itching    Sarah Cruz 57 y.o. female presents to office today for follow up to HTN  She has a past medical history of Allergy, Blood transfusion without reported diagnosis, Childhood asthma, COPD, tobacco use disorder, Hyperlipidemia, Hypertension, and Sarcoidosis.    HTN Blood pressure is elevated slightly today. She is taking hydrochlorothiazide 25 mg daily as prescribed. Continues to smoke cigarettes.  BP Readings from Last 3 Encounters:  12/08/22 136/86  09/19/22 137/76  09/01/22 122/77     Notes symptoms of increased vaginal itching and irritation.    COPD She has audible wheezing in the left lobe. Has not been using symbicort at prescribed as she has been trying to "make it stretch".   Review of Systems  Constitutional:  Negative for fever,  malaise/fatigue and weight loss.  HENT: Negative.  Negative for nosebleeds.   Eyes: Negative.  Negative for blurred vision, double vision and photophobia.  Respiratory:  Negative for cough, shortness of breath and wheezing.   Cardiovascular: Negative.  Negative for chest pain, palpitations and leg swelling.  Gastrointestinal: Negative.  Negative for heartburn, nausea and vomiting.  Genitourinary:        Vaginitis symptoms   Musculoskeletal: Negative.  Negative for myalgias.  Neurological: Negative.  Negative for dizziness, focal weakness, seizures and headaches.  Psychiatric/Behavioral: Negative.  Negative for suicidal ideas.     Past Medical History:  Diagnosis Date   Allergy    Blood transfusion without reported diagnosis    Childhood asthma    COPD (chronic obstructive pulmonary disease) (HCC)    Hyperlipidemia    Hypertension    Sarcoidosis     Past Surgical History:  Procedure Laterality Date   ABDOMINAL HYSTERECTOMY     ENDOBRONCHIAL ULTRASOUND Bilateral 12/07/2015   Procedure: ENDOBRONCHIAL ULTRASOUND;  Surgeon: Roslynn Amble, MD;  Location: [facility];  Service: Cardiopulmonary;  Laterality: Bilateral;    Family History  Problem Relation Age of Onset   Heart failure Mother    Diabetes Mother    Hypertension Mother    Arthritis Mother    Brain cancer Sister    Breast cancer Maternal Aunt    Sickle cell anemia Other    Brain cancer Other    Asthma Other    Rheumatologic disease Neg Hx    Colon cancer Neg  Hx    Esophageal cancer Neg Hx    Stomach cancer Neg Hx    Rectal cancer Neg Hx     Social History Reviewed with no changes to be made today.   Outpatient Medications Prior to Visit  Medication Sig Dispense Refill   acetaminophen (TYLENOL) 500 MG tablet Take 1,000 mg by mouth 2 (two) times daily as needed for mild pain (pain score 1-3) or headache.     albuterol (VENTOLIN HFA) 108 (90 Base) MCG/ACT inhaler Inhale 2 puffs into the lungs every 6 (six)  hours as needed. 6.7 g 1   atorvastatin (LIPITOR) 20 MG tablet Take 1 tablet (20 mg total) by mouth daily. 90 tablet 3   hydrochlorothiazide (HYDRODIURIL) 25 MG tablet Take 1 tablet (25 mg total) by mouth daily. 90 tablet 1   ibuprofen (ADVIL) 600 MG tablet Take 1 tablet (600 mg total) by mouth every 8 (eight) hours as needed. 60 tablet 1   amoxicillin-clavulanate (AUGMENTIN) 875-125 MG tablet Take 1 tablet by mouth every 12 (twelve) hours. 14 tablet 0   budesonide-formoterol (SYMBICORT) 80-4.5 MCG/ACT inhaler Inhale 2 puffs into the lungs 2 (two) times daily. 10.2 g 6   nicotine (NICODERM CQ - DOSED IN MG/24 HR) 7 mg/24hr patch Place 1 patch (7 mg total) onto the skin daily. (Patient not taking: Reported on 09/01/2022) 28 patch 3   No facility-administered medications prior to visit.    No Known Allergies     Objective:    BP 136/86 (BP Location: Left Arm, Patient Position: Sitting, Cuff Size: Normal)   Pulse 67   Ht 5\' 4"  (1.626 m)   Wt 155 lb 3.2 oz (70.4 kg)   SpO2 100%   BMI 26.64 kg/m  Wt Readings from Last 3 Encounters:  12/08/22 155 lb 3.2 oz (70.4 kg)  09/01/22 145 lb 9.6 oz (66 kg)  05/26/22 146 lb (66.2 kg)    Physical Exam Vitals and nursing note reviewed.  Constitutional:      Appearance: She is well-developed.  HENT:     Head: Normocephalic and atraumatic.  Cardiovascular:     Rate and Rhythm: Normal rate and regular rhythm.     Heart sounds: Normal heart sounds. No murmur heard.    No friction rub. No gallop.  Pulmonary:     Effort: Pulmonary effort is normal. No tachypnea or respiratory distress.     Breath sounds: Examination of the left-upper field reveals wheezing. Examination of the left-middle field reveals wheezing. Examination of the left-lower field reveals wheezing. Wheezing present. No decreased breath sounds, rhonchi or rales.  Chest:     Chest wall: No tenderness.  Abdominal:     General: Bowel sounds are normal.     Palpations: Abdomen is  soft.  Musculoskeletal:        General: Normal range of motion.     Cervical back: Normal range of motion.  Skin:    General: Skin is warm and dry.  Neurological:     Mental Status: She is alert and oriented to person, place, and time.     Coordination: Coordination normal.  Psychiatric:        Behavior: Behavior normal. Behavior is cooperative.        Thought Content: Thought content normal.        Judgment: Judgment normal.          Patient has been counseled extensively about nutrition and exercise as well as the importance of adherence with medications and regular  follow-up. The patient was given clear instructions to go to ER or return to medical center if symptoms don't improve, worsen or new problems develop. The patient verbalized understanding.   Follow-up: Return in about 3 months (around 03/10/2023).   Claiborne Rigg, FNP-BC [facility] [place], [place] [phone]   12/08/2022, 2:49 PM

## 2022-12-09 LAB — CMP14+EGFR
ALT: 33 [IU]/L — ABNORMAL HIGH (ref 0–32)
AST: 26 [IU]/L (ref 0–40)
Albumin: 4.6 g/dL (ref 3.8–4.9)
Alkaline Phosphatase: 80 [IU]/L (ref 44–121)
BUN/Creatinine Ratio: 21 (ref 9–23)
BUN: 24 mg/dL (ref 6–24)
Bilirubin Total: 0.3 mg/dL (ref 0.0–1.2)
CO2: 27 mmol/L (ref 20–29)
Calcium: 9.9 mg/dL (ref 8.7–10.2)
Chloride: 102 mmol/L (ref 96–106)
Creatinine, Ser: 1.14 mg/dL — ABNORMAL HIGH (ref 0.57–1.00)
Globulin, Total: 2.7 g/dL (ref 1.5–4.5)
Glucose: 86 mg/dL (ref 70–99)
Potassium: 4.4 mmol/L (ref 3.5–5.2)
Sodium: 141 mmol/L (ref 134–144)
Total Protein: 7.3 g/dL (ref 6.0–8.5)
eGFR: 56 mL/min/{1.73_m2} — ABNORMAL LOW (ref >59)

## 2022-12-09 LAB — HEMOGLOBIN A1C
Est. average glucose Bld gHb Est-mCnc: 131 mg/dL
Hgb A1c MFr Bld: 6.2 % — ABNORMAL HIGH (ref 4.8–5.6)

## 2022-12-11 ENCOUNTER — Other Ambulatory Visit: Payer: Self-pay

## 2022-12-11 ENCOUNTER — Telehealth: Payer: Self-pay | Admitting: Nurse Practitioner

## 2022-12-11 ENCOUNTER — Other Ambulatory Visit: Payer: Self-pay | Admitting: Nurse Practitioner

## 2022-12-11 DIAGNOSIS — B9689 Other specified bacterial agents as the cause of diseases classified elsewhere: Secondary | ICD-10-CM

## 2022-12-11 LAB — CERVICOVAGINAL ANCILLARY ONLY
Bacterial Vaginitis (gardnerella): POSITIVE — AB
Candida Glabrata: NEGATIVE
Candida Vaginitis: NEGATIVE
Chlamydia: NEGATIVE
Comment: NEGATIVE
Comment: NEGATIVE
Comment: NEGATIVE
Comment: NEGATIVE
Comment: NEGATIVE
Comment: NORMAL
Neisseria Gonorrhea: NEGATIVE
Trichomonas: NEGATIVE

## 2022-12-11 MED ORDER — METRONIDAZOLE 500 MG PO TABS
500.0000 mg | ORAL_TABLET | Freq: Two times a day (BID) | ORAL | 0 refills | Status: AC
  Filled 2022-12-11: qty 14, 7d supply, fill #0

## 2022-12-11 NOTE — Telephone Encounter (Signed)
Patient called to speak to a nurse regarding most recent lab results. While getting patient over to a nurse, patient disconnected the call, not sure what number patient called from. Was unsuccessful in getting patient over to NT for results.

## 2022-12-12 ENCOUNTER — Other Ambulatory Visit: Payer: Self-pay

## 2022-12-12 NOTE — Telephone Encounter (Signed)
Called and spoke with patient in regard to lab results. Patient verbal understanding.

## 2023-02-19 ENCOUNTER — Other Ambulatory Visit: Payer: Self-pay

## 2023-02-20 ENCOUNTER — Other Ambulatory Visit: Payer: Self-pay | Admitting: Nurse Practitioner

## 2023-02-20 ENCOUNTER — Other Ambulatory Visit: Payer: Self-pay

## 2023-02-20 DIAGNOSIS — J449 Chronic obstructive pulmonary disease, unspecified: Secondary | ICD-10-CM

## 2023-02-20 NOTE — Telephone Encounter (Signed)
 Pt having problem getting Rx refill transferred to new pharmacy  Medication Refill - Most Recent Primary Care Visit:  Provider: THEOTIS OHM W  Department: [facility]  Visit Type: OFFICE VISIT  Date: 12/08/2022  Medication: budesonide -formoterol  (SYMBICORT ) 80-4.5 MCG/ACT inhaler   Has the patient contacted their pharmacy? Yes  Is this the correct pharmacy for this prescription? Yes If no, delete pharmacy and type the correct one.  This is the patient's preferred pharmacy: [id number] [place], [place] - [address] AT [facility] [address] [place] [address] Phone: [phone] Fax: [phone]  Has the prescription been filled recently? Yes  Is the patient out of the medication? Yes  Has the patient been seen for an appointment in the last year OR does the patient have an upcoming appointment? Yes  Can we respond through MyChart? Yes  Agent: Please be advised that Rx refills may take up to 3 business days. We ask that you follow-up with your pharmacy.

## 2023-02-21 ENCOUNTER — Telehealth: Payer: Self-pay

## 2023-02-21 ENCOUNTER — Other Ambulatory Visit: Payer: Self-pay

## 2023-02-21 ENCOUNTER — Other Ambulatory Visit: Payer: Self-pay | Admitting: Pharmacist

## 2023-02-21 ENCOUNTER — Other Ambulatory Visit: Payer: Self-pay | Admitting: Nurse Practitioner

## 2023-02-21 DIAGNOSIS — J449 Chronic obstructive pulmonary disease, unspecified: Secondary | ICD-10-CM

## 2023-02-21 MED ORDER — MOMETASONE FURO-FORMOTEROL FUM 100-5 MCG/ACT IN AERO
2.0000 | INHALATION_SPRAY | Freq: Two times a day (BID) | RESPIRATORY_TRACT | 2 refills | Status: DC
  Filled 2023-02-21: qty 13, 30d supply, fill #0

## 2023-02-21 MED ORDER — MOMETASONE FURO-FORMOTEROL FUM 100-5 MCG/ACT IN AERO: 2.0000 | INHALATION_SPRAY | Freq: Two times a day (BID) | RESPIRATORY_TRACT | 2 refills | Status: DC

## 2023-02-21 NOTE — Telephone Encounter (Signed)
 Sarah Cruz can you tell me which inhaler might possibly be covered so I can order for her. Thanks!!

## 2023-02-21 NOTE — Telephone Encounter (Signed)
 Copied from CRM [id number]. Topic: General - Other >> Feb 21, 2023  8:36 AM Yvone Marda Blow wrote: Per pt. Yu: Documents for authorization for budesonide -formoterol  (SYMBICORT ) 80-4.5 MCG/ACT inhaler, should have been faxed over to the office. PLease advise.  [id number] [place], [place] - [address] AT [facility] [address] [place] [address] Phone: [phone] Fax: [phone]

## 2023-02-21 NOTE — Telephone Encounter (Signed)
 Unable to reach patient by phone to relay results.  Unable to leave voicemail.  Mychart message sent.

## 2023-02-21 NOTE — Telephone Encounter (Signed)
 [facility]

## 2023-02-22 ENCOUNTER — Telehealth: Payer: Self-pay | Admitting: Nurse Practitioner

## 2023-02-22 ENCOUNTER — Telehealth: Payer: Self-pay

## 2023-02-22 ENCOUNTER — Other Ambulatory Visit: Payer: Self-pay

## 2023-02-22 NOTE — Telephone Encounter (Signed)
 Patient also stated if she can't get her script filled if provider would give her an emergency inhaler to get her by until she can get her refill.

## 2023-02-22 NOTE — Telephone Encounter (Signed)
 [facility] mometasone -[facility]ol  (DULERA ) 100-5 MCG/ACT  spoke with [facility] pharmacist advised that no PA needed for mometasone -formoterol  (DULERA ) 100-5 MCG/ACT  medication has been filled and ready for pick-up. Co-pay amount is $35.00.  Call to advised patient unable to reach message left on VM.

## 2023-02-22 NOTE — Telephone Encounter (Signed)
 See pt. Request for [facility] and advise.

## 2023-02-22 NOTE — Telephone Encounter (Signed)
 [facility] came in [facility] [address] and they told her the prescription for her inhaler was not there. Patient is requesting for prescripton to be sent to [facility] if possible and requesting a call back.

## 2023-02-22 NOTE — Telephone Encounter (Signed)
 Called patient to clarify Rx requested symbicort  has been discontinued on 02/21/23 and dulera  inhaler has been ordered . Reviewed message from P. Liane, CMA from 02/22/23 that [facility] and the [facility]ulera  is ready for pick up. Patient says she is at work will pick up tomorrow and verbalized understanding.

## 2023-02-22 NOTE — Telephone Encounter (Signed)
 Called patient to clarify knowledge inhaler symbicort has been discontinued on 02/21/23 and dulera inhaler has been ordered and ready for pick up. Patient verbalized understanding.

## 2023-02-22 NOTE — Telephone Encounter (Signed)
 Patient called stated she has only one pump left in her inhaler  mometasone -formoterol  (DULERA ) 100-5 MCG and [facility] but they need an auth from the provider to change the D code. Please f/u with patient as she leaving for work within the hour and will not be able to answer her phone.

## 2023-02-22 NOTE — Telephone Encounter (Signed)
 Requested by interface surescripts/ patient . Medication discontinued 02/21/23.  Requested Prescriptions  Refused Prescriptions Disp Refills   budesonide -formoterol  (SYMBICORT ) 80-4.5 MCG/ACT inhaler 30.6 g 6    Sig: Inhale 2 puffs into the lungs 2 (two) times daily.     Pulmonology:  Combination Products Passed - 02/22/2023  4:22 PM      Passed - Valid encounter within last 12 months    Recent Outpatient Visits           2 months ago Primary hypertension   [facility] [place] Theotis Haze ORN, NP   5 months ago Primary hypertension   [facility] [place]. [facility] Theotis Haze ORN, NP   9 months ago Primary hypertension   [facility] [place]. [facility] Theotis Haze ORN, NP   12 months ago Essential hypertension   [facility] [place]. [facility] Theotis Haze ORN, NP   1 year ago Essential hypertension   [facility] [place]. [facility] Theotis Haze ORN, NP       Future Appointments             In 3 weeks Theotis Haze ORN, NP [facility] [place]. [facility]

## 2023-02-22 NOTE — Telephone Encounter (Signed)
 Message sent through [place]. [facility] and the [facility]ulera is ready for pick up.

## 2023-03-16 ENCOUNTER — Other Ambulatory Visit: Payer: Self-pay

## 2023-03-16 ENCOUNTER — Encounter: Payer: Self-pay | Admitting: Nurse Practitioner

## 2023-03-16 ENCOUNTER — Ambulatory Visit: Payer: BC Managed Care – PPO | Attending: Nurse Practitioner | Admitting: Nurse Practitioner

## 2023-03-16 VITALS — BP 120/80 | HR 86 | Resp 20 | Ht 64.0 in | Wt 159.8 lb

## 2023-03-16 DIAGNOSIS — Z1211 Encounter for screening for malignant neoplasm of colon: Secondary | ICD-10-CM

## 2023-03-16 DIAGNOSIS — E782 Mixed hyperlipidemia: Secondary | ICD-10-CM

## 2023-03-16 DIAGNOSIS — I10 Essential (primary) hypertension: Secondary | ICD-10-CM

## 2023-03-16 DIAGNOSIS — Z1231 Encounter for screening mammogram for malignant neoplasm of breast: Secondary | ICD-10-CM

## 2023-03-16 DIAGNOSIS — J449 Chronic obstructive pulmonary disease, unspecified: Secondary | ICD-10-CM | POA: Diagnosis not present

## 2023-03-16 MED ORDER — ATORVASTATIN CALCIUM 20 MG PO TABS
20.0000 mg | ORAL_TABLET | Freq: Every day | ORAL | 3 refills | Status: AC
  Filled 2023-03-16 – 2023-05-21 (×2): qty 90, 90d supply, fill #0
  Filled 2023-08-19: qty 90, 90d supply, fill #1
  Filled 2023-11-14: qty 90, 90d supply, fill #2
  Filled 2024-02-13: qty 90, 90d supply, fill #3

## 2023-03-16 MED ORDER — MOMETASONE FURO-FORMOTEROL FUM 200-5 MCG/ACT IN AERO
2.0000 | INHALATION_SPRAY | Freq: Two times a day (BID) | RESPIRATORY_TRACT | 6 refills | Status: DC
  Filled 2023-03-16: qty 13, 25d supply, fill #0
  Filled 2023-04-11: qty 13, 30d supply, fill #0

## 2023-03-16 MED ORDER — HYDROCHLOROTHIAZIDE 25 MG PO TABS
25.0000 mg | ORAL_TABLET | Freq: Every day | ORAL | 1 refills | Status: DC
  Filled 2023-03-16 – 2023-05-21 (×2): qty 90, 90d supply, fill #0

## 2023-03-16 NOTE — Progress Notes (Signed)
Assessment & Plan:  Sarah Cruz was seen today for medical management of chronic issues.  Diagnoses and all orders for this visit:  Primary hypertension -     hydrochlorothiazide (HYDRODIURIL) 25 MG tablet; Take 1 tablet (25 mg total) by mouth daily. Continue all antihypertensives as prescribed.  Reminded to bring in blood pressure log for follow  up appointment.  RECOMMENDATIONS: DASH/Mediterranean Diets are healthier choices for HTN.    Mixed hyperlipidemia -     atorvastatin (LIPITOR) 20 MG tablet; Take 1 tablet (20 mg total) by mouth daily. INSTRUCTIONS: Work on a low fat, heart healthy diet and participate in regular aerobic exercise program by working out at least 150 minutes per week; 5 days a week-30 minutes per day. Avoid red meat/beef/steak,  fried foods. junk foods, sodas, sugary drinks, unhealthy snacking, alcohol and smoking.  Drink at least 80 oz of water per day and monitor your carbohydrate intake daily.    Colon cancer screening -     Ambulatory referral to Gastroenterology  Breast cancer screening by mammogram -     MM 3D SCREENING MAMMOGRAM BILATERAL BREAST; Future  COPD GOLD II/ still smoking  Wheezing on exam. Does not feel Dulera 100-5 is effective.  -     mometasone-formoterol (DULERA) 200-5 MCG/ACT AERO; Inhale 2 puffs into the lungs 2 (two) times daily.    Patient has been counseled on age-appropriate routine health concerns for screening and prevention. These are reviewed and up-to-date. Referrals have been placed accordingly. Immunizations are up-to-date or declined.    Subjective:   Chief Complaint  Patient presents with   Medical Management of Chronic Issues    Sarah Cruz 58 y.o. female presents to office today for follow up to HTN  She declines blood draw today. Doing well overall.  Patient has been counseled on age-appropriate routine health concerns for screening and prevention. These are reviewed and up-to-date. Referrals have been placed  accordingly. Immunizations are up-to-date or declined.     MAMMOGRAM: OVERDUE. Referral placed COLON CANCER SCREENING: UTD PAP SMEAR: N/A. Hysterectomy   HTN Blood pressure is well controlled. She is taking hydrochlorothiazide 25 mg daily as prescribed.  BP Readings from Last 3 Encounters:  03/16/23 120/80  12/08/22 136/86  09/19/22 137/76     Review of Systems  Constitutional:  Negative for fever, malaise/fatigue and weight loss.  HENT: Negative.  Negative for nosebleeds.   Eyes: Negative.  Negative for blurred vision, double vision and photophobia.  Respiratory: Negative.  Negative for cough and shortness of breath.   Cardiovascular: Negative.  Negative for chest pain, palpitations and leg swelling.  Gastrointestinal: Negative.  Negative for heartburn, nausea and vomiting.  Musculoskeletal: Negative.  Negative for myalgias.  Neurological: Negative.  Negative for dizziness, focal weakness, seizures and headaches.  Psychiatric/Behavioral: Negative.  Negative for suicidal ideas.     Past Medical History:  Diagnosis Date   Allergy    Blood transfusion without reported diagnosis    Childhood asthma    COPD (chronic obstructive pulmonary disease) (HCC)    Hyperlipidemia    Hypertension    Sarcoidosis     Past Surgical History:  Procedure Laterality Date   ABDOMINAL HYSTERECTOMY     ENDOBRONCHIAL ULTRASOUND Bilateral 12/07/2015   Procedure: ENDOBRONCHIAL ULTRASOUND;  Surgeon: Roslynn Amble, MD;  Location: [facility];  Service: Cardiopulmonary;  Laterality: Bilateral;    Family History  Problem Relation Age of Onset   Heart failure Mother    Diabetes Mother  Hypertension Mother    Arthritis Mother    Brain cancer Sister    Breast cancer Maternal Aunt    Sickle cell anemia Other    Brain cancer Other    Asthma Other    Rheumatologic disease Neg Hx    Colon cancer Neg Hx    Esophageal cancer Neg Hx    Stomach cancer Neg Hx    Rectal cancer Neg Hx      Social History Reviewed with no changes to be made today.   Outpatient Medications Prior to Visit  Medication Sig Dispense Refill   acetaminophen (TYLENOL) 500 MG tablet Take 1,000 mg by mouth 2 (two) times daily as needed for mild pain (pain score 1-3) or headache.     albuterol (VENTOLIN HFA) 108 (90 Base) MCG/ACT inhaler Inhale 2 puffs into the lungs every 6 (six) hours as needed. 6.7 g 1   ibuprofen (ADVIL) 600 MG tablet Take 1 tablet (600 mg total) by mouth every 8 (eight) hours as needed. 60 tablet 1   nicotine (NICODERM CQ - DOSED IN MG/24 HR) 7 mg/24hr patch Place 1 patch (7 mg total) onto the skin daily. 28 patch 3   atorvastatin (LIPITOR) 20 MG tablet Take 1 tablet (20 mg total) by mouth daily. 90 tablet 3   hydrochlorothiazide (HYDRODIURIL) 25 MG tablet Take 1 tablet (25 mg total) by mouth daily. 90 tablet 1   mometasone-formoterol (DULERA) 100-5 MCG/ACT AERO Inhale 2 puffs into the lungs 2 (two) times daily. 13 g 2   No facility-administered medications prior to visit.    No Known Allergies     Objective:    BP 120/80 (BP Location: Left Arm, Patient Position: Sitting, Cuff Size: Large)   Pulse 86   Resp 20   Ht 5\' 4"  (1.626 m)   Wt 159 lb 12.8 oz (72.5 kg)   SpO2 100%   BMI 27.43 kg/m  Wt Readings from Last 3 Encounters:  03/16/23 159 lb 12.8 oz (72.5 kg)  12/08/22 155 lb 3.2 oz (70.4 kg)  09/01/22 145 lb 9.6 oz (66 kg)    Physical Exam Vitals and nursing note reviewed.  Constitutional:      Appearance: She is well-developed.  HENT:     Head: Normocephalic and atraumatic.  Cardiovascular:     Rate and Rhythm: Normal rate and regular rhythm.     Heart sounds: Normal heart sounds. No murmur heard.    No friction rub. No gallop.  Pulmonary:     Effort: Pulmonary effort is normal. No tachypnea or respiratory distress.     Breath sounds: Wheezing present. No decreased breath sounds, rhonchi or rales.  Chest:     Chest wall: No tenderness.  Abdominal:      General: Bowel sounds are normal.     Palpations: Abdomen is soft.  Musculoskeletal:        General: Normal range of motion.     Cervical back: Normal range of motion.  Skin:    General: Skin is warm and dry.  Neurological:     Mental Status: She is alert and oriented to person, place, and time.     Coordination: Coordination normal.  Psychiatric:        Behavior: Behavior normal. Behavior is cooperative.        Thought Content: Thought content normal.        Judgment: Judgment normal.          Patient has been counseled extensively about nutrition and exercise  as well as the importance of adherence with medications and regular follow-up. The patient was given clear instructions to go to ER or return to medical center if symptoms don't improve, worsen or new problems develop. The patient verbalized understanding.   Follow-up: Return in about 3 months (around 06/13/2023).   Claiborne Rigg, FNP-BC [facility] and [facility] [place], [place] [phone]   03/16/2023, 12:04 PM

## 2023-04-11 ENCOUNTER — Other Ambulatory Visit: Payer: Self-pay

## 2023-05-21 ENCOUNTER — Other Ambulatory Visit: Payer: Self-pay

## 2023-05-21 ENCOUNTER — Other Ambulatory Visit (HOSPITAL_COMMUNITY): Payer: Self-pay

## 2023-05-22 ENCOUNTER — Other Ambulatory Visit: Payer: Self-pay

## 2023-06-13 ENCOUNTER — Encounter: Payer: Self-pay | Admitting: Nurse Practitioner

## 2023-06-13 ENCOUNTER — Ambulatory Visit: Payer: BC Managed Care – PPO | Attending: Nurse Practitioner | Admitting: Nurse Practitioner

## 2023-06-13 VITALS — BP 130/73 | HR 90 | Resp 20 | Ht 64.0 in | Wt 160.5 lb

## 2023-06-13 DIAGNOSIS — J449 Chronic obstructive pulmonary disease, unspecified: Secondary | ICD-10-CM | POA: Diagnosis not present

## 2023-06-13 DIAGNOSIS — I10 Essential (primary) hypertension: Secondary | ICD-10-CM | POA: Diagnosis not present

## 2023-06-13 DIAGNOSIS — R7303 Prediabetes: Secondary | ICD-10-CM | POA: Diagnosis not present

## 2023-06-13 MED ORDER — HYDROCHLOROTHIAZIDE 25 MG PO TABS: 25.0000 mg | ORAL_TABLET | Freq: Every day | ORAL | 1 refills | Status: DC

## 2023-06-13 MED ORDER — ALBUTEROL SULFATE HFA 108 (90 BASE) MCG/ACT IN AERS: 2.0000 | INHALATION_SPRAY | Freq: Four times a day (QID) | RESPIRATORY_TRACT | 1 refills | Status: AC | PRN

## 2023-06-13 NOTE — Progress Notes (Signed)
 Assessment & Plan:  Sarah Cruz was seen today for hypertension.  Diagnoses and all orders for this visit:  Primary hypertension -     CMP14+EGFR -     hydrochlorothiazide  (HYDRODIURIL ) 25 MG tablet; Take 1 tablet (25 mg total) by mouth daily.  COPD GOLD II/ still smoking  -     albuterol  (VENTOLIN  HFA) 108 (90 Base) MCG/ACT inhaler; Inhale 2 puffs into the lungs every 6 (six) hours as needed.  Prediabetes -     CMP14+EGFR -     Hemoglobin A1c    Patient has been counseled on age-appropriate routine health concerns for screening and prevention. These are reviewed and up-to-date. Referrals have been placed accordingly. Immunizations are up-to-date or declined.    Subjective:   Chief Complaint  Patient presents with   Hypertension    Sarah Cruz 58 y.o. female presents to office today for follow-up to hypertension.  She has a past medical history of hyperlipidemia, hypertension, sarcoidosis, COPD and tobacco dependence   HTN She is currently prescribed hydrochlorothiazide  25 mg daily.  Her weight is stable.  She endorses smoking a cigarette and drinking caffeinated coffee prior to her office visit today.  She is requesting to be seen in office every 6 months instead of every 3 months for future visits.  She has nicotine  patches available. BP Readings from Last 3 Encounters:  06/13/23 130/73  03/16/23 120/80  12/08/22 136/86     Review of Systems  Constitutional:  Negative for fever, malaise/fatigue and weight loss.  HENT: Negative.  Negative for nosebleeds.   Eyes: Negative.  Negative for blurred vision, double vision and photophobia.  Respiratory: Negative.  Negative for cough and shortness of breath.   Cardiovascular: Negative.  Negative for chest pain, palpitations and leg swelling.  Gastrointestinal: Negative.  Negative for heartburn, nausea and vomiting.  Musculoskeletal: Negative.  Negative for myalgias.  Neurological: Negative.  Negative for dizziness, focal  weakness, seizures and headaches.  Psychiatric/Behavioral: Negative.  Negative for suicidal ideas.     Past Medical History:  Diagnosis Date   Allergy    Blood transfusion without reported diagnosis    Childhood asthma    COPD (chronic obstructive pulmonary disease) (HCC)    Hyperlipidemia    Hypertension    Sarcoidosis     Past Surgical History:  Procedure Laterality Date   ABDOMINAL HYSTERECTOMY     ENDOBRONCHIAL ULTRASOUND Bilateral 12/07/2015   Procedure: ENDOBRONCHIAL ULTRASOUND;  Surgeon: Samual Crochet, MD;  Location: [facility];  Service: Cardiopulmonary;  Laterality: Bilateral;    Family History  Problem Relation Age of Onset   Heart failure Mother    Diabetes Mother    Hypertension Mother    Arthritis Mother    Brain cancer Sister    Breast cancer Maternal Aunt    Sickle cell anemia Other    Brain cancer Other    Asthma Other    Rheumatologic disease Neg Hx    Colon cancer Neg Hx    Esophageal cancer Neg Hx    Stomach cancer Neg Hx    Rectal cancer Neg Hx     Social History Reviewed with no changes to be made today.   Outpatient Medications Prior to Visit  Medication Sig Dispense Refill   atorvastatin  (LIPITOR) 20 MG tablet Take 1 tablet (20 mg total) by mouth daily. 90 tablet 3   ibuprofen  (ADVIL ) 600 MG tablet Take 1 tablet (600 mg total) by mouth every 8 (eight) hours as needed. 60 tablet  1   mometasone -formoterol  (DULERA ) 200-5 MCG/ACT AERO Inhale 2 puffs into the lungs 2 (two) times daily. 13 g 6   nicotine  (NICODERM CQ  - DOSED IN MG/24 HR) 7 mg/24hr patch Place 1 patch (7 mg total) onto the skin daily. 28 patch 3   hydrochlorothiazide  (HYDRODIURIL ) 25 MG tablet Take 1 tablet (25 mg total) by mouth daily. 90 tablet 1   acetaminophen  (TYLENOL ) 500 MG tablet Take 1,000 mg by mouth 2 (two) times daily as needed for mild pain (pain score 1-3) or headache.     albuterol  (VENTOLIN  HFA) 108 (90 Base) MCG/ACT inhaler Inhale 2 puffs into the lungs every 6  (six) hours as needed. (Patient not taking: Reported on 06/13/2023) 6.7 g 1   No facility-administered medications prior to visit.    No Known Allergies     Objective:    BP 130/73 (BP Location: Left Arm, Patient Position: Sitting, Cuff Size: Normal)   Pulse 90   Resp 20   Ht 5\' 4"  (1.626 m)   Wt 160 lb 8 oz (72.8 kg)   SpO2 100%   BMI 27.55 kg/m  Wt Readings from Last 3 Encounters:  06/13/23 160 lb 8 oz (72.8 kg)  03/16/23 159 lb 12.8 oz (72.5 kg)  12/08/22 155 lb 3.2 oz (70.4 kg)    Physical Exam Vitals and nursing note reviewed.  Constitutional:      Appearance: She is well-developed.  HENT:     Head: Normocephalic and atraumatic.  Cardiovascular:     Rate and Rhythm: Normal rate and regular rhythm.     Heart sounds: Normal heart sounds. No murmur heard.    No friction rub. No gallop.  Pulmonary:     Effort: Pulmonary effort is normal. No tachypnea or respiratory distress.     Breath sounds: Wheezing present. No decreased breath sounds, rhonchi or rales.  Chest:     Chest wall: No tenderness.  Abdominal:     General: Bowel sounds are normal.     Palpations: Abdomen is soft.  Musculoskeletal:        General: Normal range of motion.     Cervical back: Normal range of motion.  Skin:    General: Skin is warm and dry.  Neurological:     Mental Status: She is alert and oriented to person, place, and time.     Coordination: Coordination normal.  Psychiatric:        Behavior: Behavior normal. Behavior is cooperative.        Thought Content: Thought content normal.        Judgment: Judgment normal.          Patient has been counseled extensively about nutrition and exercise as well as the importance of adherence with medications and regular follow-up. The patient was given clear instructions to go to ER or return to medical center if symptoms don't improve, worsen or new problems develop. The patient verbalized understanding.   Follow-up: Return in about 6  months (around 12/13/2023).   Collins Dean, FNP-BC [facility] and [facility] [place], [place] [phone]   06/13/2023, 9:45 AM

## 2023-06-14 LAB — CMP14+EGFR
ALT: 27 IU/L (ref 0–32)
AST: 20 IU/L (ref 0–40)
Albumin: 4.6 g/dL (ref 3.8–4.9)
Alkaline Phosphatase: 74 IU/L (ref 44–121)
BUN/Creatinine Ratio: 21 (ref 9–23)
BUN: 19 mg/dL (ref 6–24)
Bilirubin Total: 0.3 mg/dL (ref 0.0–1.2)
CO2: 24 mmol/L (ref 20–29)
Calcium: 9.8 mg/dL (ref 8.7–10.2)
Chloride: 99 mmol/L (ref 96–106)
Creatinine, Ser: 0.92 mg/dL (ref 0.57–1.00)
Globulin, Total: 2.6 g/dL (ref 1.5–4.5)
Glucose: 99 mg/dL (ref 70–99)
Potassium: 4.2 mmol/L (ref 3.5–5.2)
Sodium: 138 mmol/L (ref 134–144)
Total Protein: 7.2 g/dL (ref 6.0–8.5)
eGFR: 72 mL/min/{1.73_m2} (ref >59)

## 2023-06-14 LAB — HEMOGLOBIN A1C
Est. average glucose Bld gHb Est-mCnc: 137 mg/dL
Hgb A1c MFr Bld: 6.4 % — ABNORMAL HIGH (ref 4.8–5.6)

## 2023-06-16 ENCOUNTER — Encounter: Payer: Self-pay | Admitting: Nurse Practitioner

## 2023-08-20 ENCOUNTER — Other Ambulatory Visit: Payer: Self-pay

## 2023-08-21 ENCOUNTER — Telehealth: Payer: Self-pay | Admitting: Nurse Practitioner

## 2023-08-21 ENCOUNTER — Other Ambulatory Visit: Payer: Self-pay | Admitting: Nurse Practitioner

## 2023-08-21 ENCOUNTER — Other Ambulatory Visit: Payer: Self-pay

## 2023-08-21 DIAGNOSIS — I10 Essential (primary) hypertension: Secondary | ICD-10-CM

## 2023-08-21 DIAGNOSIS — E782 Mixed hyperlipidemia: Secondary | ICD-10-CM

## 2023-08-21 MED ORDER — HYDROCHLOROTHIAZIDE 25 MG PO TABS
25.0000 mg | ORAL_TABLET | Freq: Every day | ORAL | 1 refills | Status: DC
  Filled 2023-08-21: qty 90, 90d supply, fill #0
  Filled 2023-11-14: qty 90, 90d supply, fill #1

## 2023-08-21 NOTE — Telephone Encounter (Signed)
Medication sent and patient aware  

## 2023-08-21 NOTE — Telephone Encounter (Signed)
 Pt requesting approval on hydrochlorothiazide  (HYDRODIURIL ) 25 MG tablet [[id number]]

## 2023-08-22 ENCOUNTER — Other Ambulatory Visit: Payer: Self-pay

## 2023-10-26 ENCOUNTER — Telehealth: Payer: Self-pay | Admitting: Nurse Practitioner

## 2023-10-26 NOTE — Telephone Encounter (Signed)
 Contacted pt left vm to call back to resch appt to Dr Newlin

## 2023-11-14 ENCOUNTER — Other Ambulatory Visit: Payer: Self-pay

## 2023-11-15 ENCOUNTER — Other Ambulatory Visit: Payer: Self-pay

## 2023-12-14 ENCOUNTER — Encounter: Payer: Self-pay | Admitting: Family Medicine

## 2023-12-14 ENCOUNTER — Ambulatory Visit: Attending: Family Medicine | Admitting: Family Medicine

## 2023-12-14 ENCOUNTER — Ambulatory Visit: Admitting: Nurse Practitioner

## 2023-12-14 VITALS — BP 133/78 | HR 63 | Temp 98.8°F | Ht 64.0 in | Wt 159.8 lb

## 2023-12-14 DIAGNOSIS — R7303 Prediabetes: Secondary | ICD-10-CM

## 2023-12-14 DIAGNOSIS — E782 Mixed hyperlipidemia: Secondary | ICD-10-CM

## 2023-12-14 DIAGNOSIS — Z130 Encounter for screening for diseases of the blood and blood-forming organs and certain disorders involving the immune mechanism: Secondary | ICD-10-CM | POA: Diagnosis not present

## 2023-12-14 DIAGNOSIS — I10 Essential (primary) hypertension: Secondary | ICD-10-CM

## 2023-12-14 DIAGNOSIS — Z7902 Long term (current) use of antithrombotics/antiplatelets: Secondary | ICD-10-CM

## 2023-12-14 DIAGNOSIS — R21 Rash and other nonspecific skin eruption: Secondary | ICD-10-CM

## 2023-12-14 DIAGNOSIS — Z79899 Other long term (current) drug therapy: Secondary | ICD-10-CM

## 2023-12-14 DIAGNOSIS — F17210 Nicotine dependence, cigarettes, uncomplicated: Secondary | ICD-10-CM

## 2023-12-14 NOTE — Patient Instructions (Signed)
 VISIT SUMMARY:  Today, you came in for a routine follow-up and to discuss the dark spots on your face. We reviewed your management of prediabetes and hypertension, and discussed your concerns about facial discoloration. You also mentioned the challenges of staying hydrated at work.  YOUR PLAN:  -PREDIABETES: Prediabetes means your blood sugar levels are higher than normal but not high enough to be classified as diabetes. You are managing it well with dietary changes. We will order an A1c test to monitor your blood sugar levels.  -HYPERTENSION: Hypertension, or high blood pressure, is well-controlled with your current medication. Your blood pressure was excellent today.  -HYPERLIPIDEMIA: Hyperlipidemia means you have high levels of fats in your blood, which is managed with atorvastatin . We will order a cholesterol test to monitor your lipid levels.  -FACIAL HYPERPIGMENTATION: Facial hyperpigmentation refers to dark spots on your face. We will refer you to dermatology for further evaluation and order an anemia screening test to rule out any underlying causes.  INSTRUCTIONS:  Please follow up with the lab for your A1c and cholesterol tests. Additionally, schedule an appointment with dermatology for your facial hyperpigmentation evaluation. Continue with your current dietary and medication regimen, and try to stay hydrated as best as you can at work.

## 2023-12-14 NOTE — Progress Notes (Signed)
 Subjective:  Patient ID: Sarah Cruz, female    DOB: [birth year]  Age: 58 y.o. MRN: [id number]  CC: Medical Management of Chronic Issues (Discuss medication recall/Referral to dermatology for dark spots on face)     Discussed the use of AI scribe software for clinical note transcription with the patient, who gave verbal consent to proceed.  History of Present Illness Sarah Cruz is a 58 year old female patient of Haze Servant, NP with a history of hypertension, prediabetes, hyperlipidemia who presents for routine follow-up and dermatology referral for facial dark spots.  She is concerned about dark spots on her face, especially under her eyes, present for over a year. She uses turmeric and gold eye masks, noting some improvement but persistent discoloration. There is no itching or other symptoms associated with the spots.  She manages prediabetes through dietary changes, avoiding ice cream, eating more salads and avocados, and limiting meat to weekends. Her last A1c in May was 6.4. Hypertension is managed with hydrochlorothiazide .  She works in a [employer] environment where maintaining hydration is challenging. She attempts to drink water but finds it difficult during work hours. There is no abdominal pain or significant cold intolerance.    Past Medical History:  Diagnosis Date   Allergy    Blood transfusion without reported diagnosis    Childhood asthma    COPD (chronic obstructive pulmonary disease) (HCC)    Hyperlipidemia    Hypertension    Sarcoidosis     Past Surgical History:  Procedure Laterality Date   ABDOMINAL HYSTERECTOMY     ENDOBRONCHIAL ULTRASOUND Bilateral 12/07/2015   Procedure: ENDOBRONCHIAL ULTRASOUND;  Surgeon: Tonnie FORBES Flicker, MD;  Location: [facility];  Service: Cardiopulmonary;  Laterality: Bilateral;    Family History  Problem Relation Age of Onset   Heart failure Mother    Diabetes Mother    Hypertension Mother    Arthritis  Mother    Brain cancer Sister    Breast cancer Maternal Aunt    Sickle cell anemia Other    Brain cancer Other    Asthma Other    Rheumatologic disease Neg Hx    Colon cancer Neg Hx    Esophageal cancer Neg Hx    Stomach cancer Neg Hx    Rectal cancer Neg Hx     Social History   Socioeconomic History   Marital status: Divorced    Spouse name: Not on file   Number of children: 3   Years of education: Not on file   Highest education level: Not on file  Occupational History   Not on file  Tobacco Use   Smoking status: Every Day    Current packs/day: 0.50    Average packs/day: 0.5 packs/day for 46.7 years (23.3 ttl pk-yrs)    Types: Cigarettes    Start date: 04/08/1977   Smokeless tobacco: Never   Tobacco comments:    down to 0.5ppd  Vaping Use   Vaping status: Never Used  Substance and Sexual Activity   Alcohol use: No    Comment: Remote EtOH   Drug use: No    Comment: 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 Moved to [facility] in November 2016. Prior travel to [place]. No travel outside the [place] . Previously used to do warehouse and factory work. No pets currently. No bird, mold, or hot  tub exposure. No history of volunteer work or residence in a homeless shelter. No history of incarceration. Does have prior exposure to a nephew with TB but has always had a negative PPD skin test.    Social Drivers of Health   Financial Resource Strain: Medium Risk (12/08/2022)   Overall Financial Resource Strain (CARDIA)    Difficulty of Paying Living Expenses: Somewhat hard  Food Insecurity: No Food Insecurity (12/08/2022)   Hunger Vital Sign    Worried About Running Out of Food in the Last Year: Never true    Ran Out of Food in the Last Year: Never true  Transportation Needs: Not on file  Physical Activity: Inactive (12/08/2022)   Exercise Vital Sign    Days of Exercise per  Week: 0 days    Minutes of Exercise per Session: 0 min  Stress: No Stress Concern Present (12/08/2022)   [employer] of Occupational Health - Occupational Stress Questionnaire    Feeling of Stress : Not at all  Social Connections: Socially Isolated (12/08/2022)   Social Connection and Isolation Panel    Frequency of Communication with Friends and Family: Once a week    Frequency of Social Gatherings with Friends and Family: Never    Attends Religious Services: Never    Database Administrator or Organizations: No    Attends Engineer, Structural: Never    Marital Status: Divorced    No Known Allergies  Outpatient Medications Prior to Visit  Medication Sig Dispense Refill   acetaminophen  (TYLENOL ) 500 MG tablet Take 1,000 mg by mouth 2 (two) times daily as needed for mild pain (pain score 1-3) or headache.     albuterol  (VENTOLIN  HFA) 108 (90 Base) MCG/ACT inhaler Inhale 2 puffs into the lungs every 6 (six) hours as needed. 8.5 g 1   atorvastatin  (LIPITOR) 20 MG tablet Take 1 tablet (20 mg total) by mouth daily. 90 tablet 3   hydrochlorothiazide  (HYDRODIURIL ) 25 MG tablet Take 1 tablet (25 mg total) by mouth daily. 90 tablet 1   mometasone -formoterol  (DULERA ) 200-5 MCG/ACT AERO Inhale 2 puffs into the lungs 2 (two) times daily. 13 g 6   ibuprofen  (ADVIL ) 600 MG tablet Take 1 tablet (600 mg total) by mouth every 8 (eight) hours as needed. (Patient not taking: Reported on 12/14/2023) 60 tablet 1   nicotine  (NICODERM CQ  - DOSED IN MG/24 HR) 7 mg/24hr patch Place 1 patch (7 mg total) onto the skin daily. (Patient not taking: Reported on 12/14/2023) 28 patch 3   No facility-administered medications prior to visit.     ROS Review of Systems  Constitutional:  Negative for activity change and appetite change.  HENT:  Negative for sinus pressure and sore throat.   Respiratory:  Negative for chest tightness, shortness of breath and wheezing.   Cardiovascular:  Negative for chest  pain and palpitations.  Gastrointestinal:  Negative for abdominal distention, abdominal pain and constipation.  Genitourinary: Negative.   Musculoskeletal: Negative.   Skin:  Positive for color change.  Psychiatric/Behavioral:  Negative for behavioral problems and dysphoric mood.     Objective:  BP 133/78   Pulse 63   Temp 98.8 F (37.1 C) (Oral)   Ht 5' 4 (1.626 m)   Wt 159 lb 12.8 oz (72.5 kg)   SpO2 98%   BMI 27.43 kg/m      12/14/2023    8:37 AM 06/13/2023    9:44 AM 06/13/2023    9:17 AM  BP/Weight  Systolic  BP 133 130 157  Diastolic BP 78 73 90  Wt. (Lbs) 159.8  160.5  BMI 27.43 kg/m2  27.55 kg/m2      Physical Exam Constitutional:      Appearance: She is well-developed.  Cardiovascular:     Rate and Rhythm: Normal rate.     Heart sounds: Normal heart sounds. No murmur heard. Pulmonary:     Effort: Pulmonary effort is normal.     Breath sounds: Normal breath sounds. No wheezing or rales.  Chest:     Chest wall: No tenderness.  Abdominal:     General: Bowel sounds are normal. There is no distension.     Palpations: Abdomen is soft. There is no mass.     Tenderness: There is no abdominal tenderness.  Musculoskeletal:        General: Normal range of motion.     Right lower leg: No edema.     Left lower leg: No edema.  Skin:    Comments: Bilateral facal hyperpigmentation beneath both eyes  Neurological:     Mental Status: She is alert and oriented to person, place, and time.  Psychiatric:        Mood and Affect: Mood normal.        Latest Ref Rng & Units 06/13/2023    9:51 AM 12/08/2022   10:50 AM 05/26/2022   10:26 AM  CMP  Glucose 70 - 99 mg/dL 99  86  885   BUN 6 - 24 mg/dL 19  24  16    Creatinine 0.57 - 1.00 mg/dL 9.07  8.85  8.94   Sodium 134 - 144 mmol/L 138  141  137   Potassium 3.5 - 5.2 mmol/L 4.2  4.4  4.5   Chloride 96 - 106 mmol/L 99  102  97   CO2 20 - 29 mmol/L 24  27  26    Calcium  8.7 - 10.2 mg/dL 9.8  9.9  89.6   Total Protein  6.0 - 8.5 g/dL 7.2  7.3  7.4   Total Bilirubin 0.0 - 1.2 mg/dL 0.3  0.3  0.3   Alkaline Phos 44 - 121 IU/L 74  80  74   AST 0 - 40 IU/L 20  26  19    ALT 0 - 32 IU/L 27  33  20     Lipid Panel     Component Value Date/Time   CHOL 178 08/24/2021 1630   TRIG 104 08/24/2021 1630   HDL 80 08/24/2021 1630   CHOLHDL 2.2 08/24/2021 1630   LDLCALC 80 08/24/2021 1630    CBC    Component Value Date/Time   WBC 5.7 05/26/2022 1026   WBC 4.4 10/16/2015 1605   RBC 5.66 (H) 05/26/2022 1026   RBC 5.30 (H) 10/16/2015 1605   HGB 13.3 05/26/2022 1026   HCT 41.6 05/26/2022 1026   PLT 352 05/26/2022 1026   MCV 74 (L) 05/26/2022 1026   MCH 23.5 (L) 05/26/2022 1026   MCH 22.6 (L) 10/16/2015 1605   MCHC 32.0 05/26/2022 1026   MCHC 30.5 10/16/2015 1605   RDW 14.3 05/26/2022 1026   LYMPHSABS 2.0 05/26/2022 1026   MONOABS 0.4 10/16/2015 1605   EOSABS 0.3 05/26/2022 1026   BASOSABS 0.1 05/26/2022 1026    Lab Results  Component Value Date   HGBA1C 6.4 (H) 06/13/2023       Assessment & Plan Prediabetes Previous A1c of 6.4% in May. Monitoring dietary habits with increased salads, avocados, limited heavy meats, and  reduced sweets. -Continue to work on lifestyle - Order A1c test to monitor prediabetes status.  Hypertension Well-controlled with current medication regimen. Blood pressure excellent today. -Counseled on blood pressure goal of less than 130/80, low-sodium, DASH diet, medication compliance, 150 minutes of moderate intensity exercise per week. Discussed medication compliance, adverse effects.   Chronic obstructive pulmonary disease (COPD) Stable with no flares Continue Dulera   Hyperlipidemia Managed with atorvastatin . Last cholesterol levels normal in 2023. - Order cholesterol test to monitor lipid levels. - Continue statin - Low-cholesterol diet  Facial hyperpigmentation, unspecified etiology Present for over a year with no itching or other symptoms. Anemia unlikely. -  Refer to dermatology for evaluation of facial hyperpigmentation. - Order anemia screening test.       No orders of the defined types were placed in this encounter.   Follow-up: Return in about 6 months (around 06/12/2024) for Medical conditions with PCP.       Corrina Sabin, MD, FAAFP. [facility] [place], [place] [phone]   12/14/2023, 9:22 AM

## 2023-12-15 LAB — LP+NON-HDL CHOLESTEROL
Cholesterol, Total: 169 mg/dL (ref 100–199)
HDL: 80 mg/dL (ref >39)
LDL Chol Calc (NIH): 74 mg/dL (ref 0–99)
Total Non-HDL-Chol (LDL+VLDL): 89 mg/dL (ref 0–129)
Triglycerides: 82 mg/dL (ref 0–149)
VLDL Cholesterol Cal: 15 mg/dL (ref 5–40)

## 2023-12-15 LAB — CBC WITH DIFFERENTIAL/PLATELET
Basophils Absolute: 0.1 x10E3/uL (ref 0.0–0.2)
Basos: 1 %
EOS (ABSOLUTE): 0.3 x10E3/uL (ref 0.0–0.4)
Eos: 5 %
Hematocrit: 43 % (ref 34.0–46.6)
Hemoglobin: 13.2 g/dL (ref 11.1–15.9)
Immature Grans (Abs): 0 x10E3/uL (ref 0.0–0.1)
Immature Granulocytes: 0 %
Lymphocytes Absolute: 1.6 x10E3/uL (ref 0.7–3.1)
Lymphs: 30 %
MCH: 23.8 pg — ABNORMAL LOW (ref 26.6–33.0)
MCHC: 30.7 g/dL — ABNORMAL LOW (ref 31.5–35.7)
MCV: 78 fL — ABNORMAL LOW (ref 79–97)
Monocytes Absolute: 0.5 x10E3/uL (ref 0.1–0.9)
Monocytes: 9 %
Neutrophils Absolute: 3 x10E3/uL (ref 1.4–7.0)
Neutrophils: 55 %
Platelets: 340 x10E3/uL (ref 150–450)
RBC: 5.55 x10E6/uL — ABNORMAL HIGH (ref 3.77–5.28)
RDW: 14.4 % (ref 11.7–15.4)
WBC: 5.4 x10E3/uL (ref 3.4–10.8)

## 2023-12-15 LAB — CMP14+EGFR
ALT: 28 IU/L (ref 0–32)
AST: 23 IU/L (ref 0–40)
Albumin: 4.6 g/dL (ref 3.8–4.9)
Alkaline Phosphatase: 67 IU/L (ref 49–135)
BUN/Creatinine Ratio: 16 (ref 9–23)
BUN: 15 mg/dL (ref 6–24)
Bilirubin Total: 0.3 mg/dL (ref 0.0–1.2)
CO2: 26 mmol/L (ref 20–29)
Calcium: 10.2 mg/dL (ref 8.7–10.2)
Chloride: 99 mmol/L (ref 96–106)
Creatinine, Ser: 0.96 mg/dL (ref 0.57–1.00)
Globulin, Total: 2.8 g/dL (ref 1.5–4.5)
Glucose: 101 mg/dL — ABNORMAL HIGH (ref 70–99)
Potassium: 4.6 mmol/L (ref 3.5–5.2)
Sodium: 140 mmol/L (ref 134–144)
Total Protein: 7.4 g/dL (ref 6.0–8.5)
eGFR: 69 mL/min/1.73 (ref >59)

## 2023-12-15 LAB — HEMOGLOBIN A1C
Est. average glucose Bld gHb Est-mCnc: 131 mg/dL
Hgb A1c MFr Bld: 6.2 % — ABNORMAL HIGH (ref 4.8–5.6)

## 2023-12-17 ENCOUNTER — Ambulatory Visit: Payer: Self-pay | Admitting: Family Medicine

## 2024-02-13 ENCOUNTER — Other Ambulatory Visit: Payer: Self-pay | Admitting: Nurse Practitioner

## 2024-02-13 ENCOUNTER — Other Ambulatory Visit: Payer: Self-pay

## 2024-02-13 DIAGNOSIS — I10 Essential (primary) hypertension: Secondary | ICD-10-CM

## 2024-02-13 MED ORDER — HYDROCHLOROTHIAZIDE 25 MG PO TABS
25.0000 mg | ORAL_TABLET | Freq: Every day | ORAL | 1 refills | Status: AC
  Filled 2024-02-13: qty 90, 90d supply, fill #0

## 2024-02-14 ENCOUNTER — Other Ambulatory Visit: Payer: Self-pay | Admitting: Nurse Practitioner

## 2024-02-14 DIAGNOSIS — J449 Chronic obstructive pulmonary disease, unspecified: Secondary | ICD-10-CM

## 2024-02-15 ENCOUNTER — Other Ambulatory Visit: Payer: Self-pay

## 2024-06-13 ENCOUNTER — Ambulatory Visit: Admitting: Nurse Practitioner
